# Patient Record
Sex: Female | Born: 1960 | Race: White | Hispanic: No | Marital: Married | Smoking: Never smoker
Health system: Southern US, Community
[De-identification: ages and names within clinical notes are randomized; demographics above are authoritative.]

## PROBLEM LIST (undated history)

## (undated) DIAGNOSIS — F319 Bipolar disorder, unspecified: Secondary | ICD-10-CM

## (undated) DIAGNOSIS — F410 Panic disorder [episodic paroxysmal anxiety] without agoraphobia: Secondary | ICD-10-CM

## (undated) DIAGNOSIS — M1990 Unspecified osteoarthritis, unspecified site: Secondary | ICD-10-CM

## (undated) DIAGNOSIS — F32A Depression, unspecified: Secondary | ICD-10-CM

## (undated) DIAGNOSIS — T7840XA Allergy, unspecified, initial encounter: Secondary | ICD-10-CM

## (undated) DIAGNOSIS — R000 Tachycardia, unspecified: Secondary | ICD-10-CM

## (undated) DIAGNOSIS — Z86010 Personal history of colonic polyps: Secondary | ICD-10-CM

## (undated) DIAGNOSIS — E119 Type 2 diabetes mellitus without complications: Secondary | ICD-10-CM

## (undated) DIAGNOSIS — A159 Respiratory tuberculosis unspecified: Secondary | ICD-10-CM

## (undated) DIAGNOSIS — H269 Unspecified cataract: Secondary | ICD-10-CM

## (undated) DIAGNOSIS — E785 Hyperlipidemia, unspecified: Secondary | ICD-10-CM

## (undated) DIAGNOSIS — I10 Essential (primary) hypertension: Secondary | ICD-10-CM

## (undated) DIAGNOSIS — F329 Major depressive disorder, single episode, unspecified: Secondary | ICD-10-CM

## (undated) HISTORY — DX: Allergy, unspecified, initial encounter: T78.40XA

## (undated) HISTORY — DX: Essential (primary) hypertension: I10

## (undated) HISTORY — DX: Major depressive disorder, single episode, unspecified: F32.9

## (undated) HISTORY — DX: Depression, unspecified: F32.A

## (undated) HISTORY — DX: Hyperlipidemia, unspecified: E78.5

## (undated) HISTORY — PX: COLONOSCOPY: SHX174

## (undated) HISTORY — DX: Personal history of colonic polyps: Z86.010

## (undated) HISTORY — DX: Unspecified cataract: H26.9

## (undated) HISTORY — DX: Type 2 diabetes mellitus without complications: E11.9

## (undated) HISTORY — PX: POLYPECTOMY: SHX149

## (undated) HISTORY — DX: Tachycardia, unspecified: R00.0

## (undated) HISTORY — DX: Panic disorder (episodic paroxysmal anxiety): F41.0

## (undated) HISTORY — DX: Bipolar disorder, unspecified: F31.9

## (undated) HISTORY — DX: Respiratory tuberculosis unspecified: A15.9

## (undated) HISTORY — DX: Unspecified osteoarthritis, unspecified site: M19.90

## (undated) HISTORY — PX: EYE SURGERY: SHX253

---
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
## 2001-03-22 ENCOUNTER — Other Ambulatory Visit: Admission: RE | Admit: 2001-03-22 | Discharge: 2001-03-22 | Payer: Self-pay | Admitting: Family Medicine

## 2001-04-05 ENCOUNTER — Encounter: Payer: Self-pay | Admitting: Family Medicine

## 2001-04-05 ENCOUNTER — Ambulatory Visit (HOSPITAL_COMMUNITY): Admission: RE | Admit: 2001-04-05 | Discharge: 2001-04-05 | Payer: Self-pay | Admitting: Family Medicine

## 2003-10-10 ENCOUNTER — Other Ambulatory Visit: Admission: RE | Admit: 2003-10-10 | Discharge: 2003-10-10 | Payer: Self-pay | Admitting: Family Medicine

## 2003-10-12 ENCOUNTER — Ambulatory Visit (HOSPITAL_COMMUNITY): Admission: RE | Admit: 2003-10-12 | Discharge: 2003-10-12 | Payer: Self-pay | Admitting: Family Medicine

## 2005-04-20 ENCOUNTER — Ambulatory Visit: Payer: Self-pay | Admitting: Family Medicine

## 2005-04-22 ENCOUNTER — Ambulatory Visit: Payer: Self-pay | Admitting: Cardiology

## 2005-07-06 ENCOUNTER — Ambulatory Visit: Payer: Self-pay | Admitting: Family Medicine

## 2005-11-02 ENCOUNTER — Ambulatory Visit: Payer: Self-pay | Admitting: Family Medicine

## 2005-11-06 ENCOUNTER — Other Ambulatory Visit: Admission: RE | Admit: 2005-11-06 | Discharge: 2005-11-06 | Payer: Self-pay | Admitting: Family Medicine

## 2005-11-06 ENCOUNTER — Encounter: Payer: Self-pay | Admitting: Family Medicine

## 2005-11-06 ENCOUNTER — Ambulatory Visit: Payer: Self-pay | Admitting: Family Medicine

## 2005-11-12 ENCOUNTER — Ambulatory Visit (HOSPITAL_COMMUNITY): Admission: RE | Admit: 2005-11-12 | Discharge: 2005-11-12 | Payer: Self-pay | Admitting: Family Medicine

## 2005-12-02 ENCOUNTER — Ambulatory Visit: Payer: Self-pay | Admitting: Family Medicine

## 2006-01-05 ENCOUNTER — Ambulatory Visit: Payer: Self-pay | Admitting: Internal Medicine

## 2006-02-11 ENCOUNTER — Ambulatory Visit (HOSPITAL_COMMUNITY): Payer: Self-pay | Admitting: *Deleted

## 2006-02-22 ENCOUNTER — Ambulatory Visit: Payer: Self-pay | Admitting: Family Medicine

## 2006-02-23 ENCOUNTER — Ambulatory Visit: Payer: Self-pay | Admitting: Family Medicine

## 2006-05-13 ENCOUNTER — Ambulatory Visit: Payer: Self-pay | Admitting: Family Medicine

## 2006-11-04 ENCOUNTER — Encounter: Payer: Self-pay | Admitting: Family Medicine

## 2006-11-04 DIAGNOSIS — K589 Irritable bowel syndrome without diarrhea: Secondary | ICD-10-CM | POA: Insufficient documentation

## 2006-11-04 DIAGNOSIS — R7989 Other specified abnormal findings of blood chemistry: Secondary | ICD-10-CM | POA: Insufficient documentation

## 2006-11-04 DIAGNOSIS — I10 Essential (primary) hypertension: Secondary | ICD-10-CM | POA: Insufficient documentation

## 2006-12-29 ENCOUNTER — Ambulatory Visit: Payer: Self-pay | Admitting: Family Medicine

## 2006-12-29 DIAGNOSIS — M25569 Pain in unspecified knee: Secondary | ICD-10-CM | POA: Insufficient documentation

## 2007-01-11 ENCOUNTER — Telehealth: Payer: Self-pay | Admitting: Family Medicine

## 2007-01-17 ENCOUNTER — Ambulatory Visit: Payer: Self-pay | Admitting: Family Medicine

## 2007-01-17 LAB — CONVERTED CEMR LAB
AST: 22 units/L (ref 0–37)
BUN: 8 mg/dL (ref 6–23)
CO2: 31 meq/L (ref 19–32)
Creatinine, Ser: 0.7 mg/dL (ref 0.4–1.2)
Direct LDL: 137 mg/dL
HDL: 52.3 mg/dL (ref >39.0)
Potassium: 4.3 meq/L (ref 3.5–5.1)
TSH: 3.17 microintl units/mL (ref 0.35–5.50)

## 2007-01-18 ENCOUNTER — Encounter: Payer: Self-pay | Admitting: Family Medicine

## 2007-01-18 ENCOUNTER — Ambulatory Visit: Payer: Self-pay | Admitting: Family Medicine

## 2007-01-18 ENCOUNTER — Other Ambulatory Visit: Admission: RE | Admit: 2007-01-18 | Discharge: 2007-01-18 | Payer: Self-pay | Admitting: Family Medicine

## 2007-01-18 DIAGNOSIS — K921 Melena: Secondary | ICD-10-CM | POA: Insufficient documentation

## 2007-01-21 ENCOUNTER — Encounter (INDEPENDENT_AMBULATORY_CARE_PROVIDER_SITE_OTHER): Payer: Self-pay | Admitting: *Deleted

## 2007-01-26 ENCOUNTER — Ambulatory Visit (HOSPITAL_COMMUNITY): Admission: RE | Admit: 2007-01-26 | Discharge: 2007-01-26 | Payer: Self-pay | Admitting: Family Medicine

## 2007-01-28 ENCOUNTER — Ambulatory Visit: Payer: Self-pay | Admitting: Family Medicine

## 2007-01-31 ENCOUNTER — Encounter (INDEPENDENT_AMBULATORY_CARE_PROVIDER_SITE_OTHER): Payer: Self-pay | Admitting: *Deleted

## 2007-03-22 ENCOUNTER — Ambulatory Visit: Payer: Self-pay | Admitting: Family Medicine

## 2007-05-03 ENCOUNTER — Ambulatory Visit: Payer: Self-pay | Admitting: Family Medicine

## 2007-05-03 DIAGNOSIS — M542 Cervicalgia: Secondary | ICD-10-CM | POA: Insufficient documentation

## 2007-05-05 ENCOUNTER — Encounter: Payer: Self-pay | Admitting: Family Medicine

## 2007-07-26 ENCOUNTER — Ambulatory Visit: Payer: Self-pay | Admitting: Family Medicine

## 2008-01-06 ENCOUNTER — Ambulatory Visit: Payer: Self-pay | Admitting: Family Medicine

## 2008-01-30 ENCOUNTER — Encounter (INDEPENDENT_AMBULATORY_CARE_PROVIDER_SITE_OTHER): Payer: Self-pay | Admitting: *Deleted

## 2008-02-27 ENCOUNTER — Ambulatory Visit: Payer: Self-pay | Admitting: Family Medicine

## 2008-02-27 LAB — CONVERTED CEMR LAB
Albumin: 3.6 g/dL (ref 3.5–5.2)
Alkaline Phosphatase: 42 units/L (ref 39–117)
Basophils Absolute: 0 10*3/uL (ref 0.0–0.1)
Bilirubin, Direct: 0.1 mg/dL (ref 0.0–0.3)
Calcium: 8.9 mg/dL (ref 8.4–10.5)
Cholesterol: 201 mg/dL (ref 0–200)
Direct LDL: 116.6 mg/dL
Eosinophils Absolute: 0.2 10*3/uL (ref 0.0–0.7)
GFR calc Af Amer: 137 mL/min
GFR calc non Af Amer: 113 mL/min
HCT: 39.1 % (ref 36.0–46.0)
MCHC: 33.6 g/dL (ref 30.0–36.0)
MCV: 90.7 fL (ref 78.0–100.0)
Microalb Creat Ratio: 13 mg/g (ref 0.0–30.0)
Microalb, Ur: 1.6 mg/dL (ref 0.0–1.9)
Monocytes Absolute: 0.8 10*3/uL (ref 0.1–1.0)
Platelets: 273 10*3/uL (ref 150–400)
Potassium: 4.5 meq/L (ref 3.5–5.1)
RDW: 13 % (ref 11.5–14.6)
Sodium: 141 meq/L (ref 135–145)
Total CHOL/HDL Ratio: 5.2
Triglycerides: 176 mg/dL — ABNORMAL HIGH (ref 0–149)

## 2008-02-28 LAB — CONVERTED CEMR LAB: Vit D, 1,25-Dihydroxy: 15 — ABNORMAL LOW (ref 30–89)

## 2008-02-29 ENCOUNTER — Ambulatory Visit: Payer: Self-pay | Admitting: Family Medicine

## 2008-02-29 DIAGNOSIS — R05 Cough: Secondary | ICD-10-CM | POA: Insufficient documentation

## 2008-02-29 DIAGNOSIS — K219 Gastro-esophageal reflux disease without esophagitis: Secondary | ICD-10-CM | POA: Insufficient documentation

## 2008-02-29 DIAGNOSIS — R059 Cough, unspecified: Secondary | ICD-10-CM | POA: Insufficient documentation

## 2008-03-12 ENCOUNTER — Ambulatory Visit: Payer: Self-pay | Admitting: Family Medicine

## 2008-03-14 ENCOUNTER — Encounter: Admission: RE | Admit: 2008-03-14 | Discharge: 2008-03-14 | Payer: Self-pay | Admitting: Family Medicine

## 2008-03-14 LAB — CONVERTED CEMR LAB
Calcium: 9.1 mg/dL (ref 8.4–10.5)
Creatinine, Ser: 0.7 mg/dL (ref 0.4–1.2)
GFR calc Af Amer: 114 mL/min
Glucose, Bld: 131 mg/dL — ABNORMAL HIGH (ref 70–99)
Sodium: 142 meq/L (ref 135–145)
Varicella IgG: 3.28 — ABNORMAL HIGH

## 2008-03-15 ENCOUNTER — Other Ambulatory Visit: Admission: RE | Admit: 2008-03-15 | Discharge: 2008-03-15 | Payer: Self-pay | Admitting: Family Medicine

## 2008-03-15 ENCOUNTER — Ambulatory Visit: Payer: Self-pay | Admitting: Family Medicine

## 2008-03-15 ENCOUNTER — Encounter: Payer: Self-pay | Admitting: Family Medicine

## 2008-03-15 DIAGNOSIS — E113299 Type 2 diabetes mellitus with mild nonproliferative diabetic retinopathy without macular edema, unspecified eye: Secondary | ICD-10-CM | POA: Insufficient documentation

## 2008-03-15 DIAGNOSIS — Z794 Long term (current) use of insulin: Secondary | ICD-10-CM

## 2008-03-20 ENCOUNTER — Encounter (INDEPENDENT_AMBULATORY_CARE_PROVIDER_SITE_OTHER): Payer: Self-pay | Admitting: *Deleted

## 2008-03-21 ENCOUNTER — Encounter (INDEPENDENT_AMBULATORY_CARE_PROVIDER_SITE_OTHER): Payer: Self-pay | Admitting: *Deleted

## 2008-03-21 ENCOUNTER — Ambulatory Visit (HOSPITAL_COMMUNITY): Admission: RE | Admit: 2008-03-21 | Discharge: 2008-03-21 | Payer: Self-pay | Admitting: Family Medicine

## 2008-03-21 LAB — HM MAMMOGRAPHY: HM Mammogram: NORMAL

## 2008-04-10 ENCOUNTER — Telehealth: Payer: Self-pay | Admitting: Family Medicine

## 2008-05-02 ENCOUNTER — Ambulatory Visit: Payer: Self-pay | Admitting: Family Medicine

## 2008-05-02 LAB — CONVERTED CEMR LAB
Alkaline Phosphatase: 50 units/L (ref 39–117)
Basophils Absolute: 0 10*3/uL (ref 0.0–0.1)
Bilirubin, Direct: 0.1 mg/dL (ref 0.0–0.3)
Calcium: 9.3 mg/dL (ref 8.4–10.5)
Eosinophils Absolute: 0 10*3/uL (ref 0.0–0.7)
GFR calc Af Amer: 98 mL/min
GFR calc non Af Amer: 81 mL/min
Glucose, Bld: 156 mg/dL — ABNORMAL HIGH (ref 70–99)
HCT: 33.4 % — ABNORMAL LOW (ref 36.0–46.0)
Hemoglobin: 11.4 g/dL — ABNORMAL LOW (ref 12.0–15.0)
MCHC: 34.1 g/dL (ref 30.0–36.0)
Monocytes Absolute: 1.1 10*3/uL — ABNORMAL HIGH (ref 0.1–1.0)
Monocytes Relative: 7.8 % (ref 3.0–12.0)
Neutro Abs: 10.9 10*3/uL — ABNORMAL HIGH (ref 1.4–7.7)
Platelets: 374 10*3/uL (ref 150–400)
Potassium: 4.3 meq/L (ref 3.5–5.1)
RDW: 13.3 % (ref 11.5–14.6)
Sodium: 138 meq/L (ref 135–145)
Total Bilirubin: 1 mg/dL (ref 0.3–1.2)
Total Protein: 7.8 g/dL (ref 6.0–8.3)

## 2008-05-15 ENCOUNTER — Ambulatory Visit: Payer: Self-pay | Admitting: Family Medicine

## 2008-05-17 LAB — CONVERTED CEMR LAB
Mumps IgG: 3.67 — ABNORMAL HIGH
Rubeola IgG: 1.43 — ABNORMAL HIGH

## 2009-06-03 ENCOUNTER — Ambulatory Visit: Payer: Self-pay | Admitting: Family Medicine

## 2009-06-03 LAB — CONVERTED CEMR LAB
ALT: 31 units/L (ref 0–35)
Alkaline Phosphatase: 45 units/L (ref 39–117)
Basophils Relative: 0.6 % (ref 0.0–3.0)
Bilirubin, Direct: 0 mg/dL (ref 0.0–0.3)
CO2: 28 meq/L (ref 19–32)
Chloride: 102 meq/L (ref 96–112)
Creatinine, Ser: 0.6 mg/dL (ref 0.4–1.2)
Direct LDL: 146.6 mg/dL
Eosinophils Relative: 3.3 % (ref 0.0–5.0)
HCT: 40.7 % (ref 36.0–46.0)
HDL: 50.2 mg/dL (ref >39.00)
Hgb A1c MFr Bld: 7.4 % — ABNORMAL HIGH (ref 4.6–6.5)
Microalb Creat Ratio: 203.4 mg/g — ABNORMAL HIGH (ref 0.0–30.0)
Microalb, Ur: 35.8 mg/dL — ABNORMAL HIGH (ref 0.0–1.9)
Monocytes Relative: 7.6 % (ref 3.0–12.0)
Neutrophils Relative %: 51.3 % (ref 43.0–77.0)
Platelets: 284 10*3/uL (ref 150.0–400.0)
Potassium: 4.3 meq/L (ref 3.5–5.1)
RBC: 4.4 M/uL (ref 3.87–5.11)
Sodium: 139 meq/L (ref 135–145)
TSH: 1.42 microintl units/mL (ref 0.35–5.50)
Total Bilirubin: 0.6 mg/dL (ref 0.3–1.2)
Total CHOL/HDL Ratio: 4
Total Protein: 6.7 g/dL (ref 6.0–8.3)
Triglycerides: 222 mg/dL — ABNORMAL HIGH (ref 0.0–149.0)
WBC: 10.2 10*3/uL (ref 4.5–10.5)

## 2009-06-05 ENCOUNTER — Ambulatory Visit: Payer: Self-pay | Admitting: Family Medicine

## 2009-06-05 ENCOUNTER — Other Ambulatory Visit: Admission: RE | Admit: 2009-06-05 | Discharge: 2009-06-05 | Payer: Self-pay | Admitting: Family Medicine

## 2009-06-05 DIAGNOSIS — F319 Bipolar disorder, unspecified: Secondary | ICD-10-CM | POA: Insufficient documentation

## 2009-06-05 LAB — CONVERTED CEMR LAB: Pap Smear: NORMAL

## 2009-06-12 ENCOUNTER — Encounter: Payer: Self-pay | Admitting: Family Medicine

## 2009-06-18 ENCOUNTER — Telehealth: Payer: Self-pay | Admitting: Family Medicine

## 2009-06-21 ENCOUNTER — Telehealth (INDEPENDENT_AMBULATORY_CARE_PROVIDER_SITE_OTHER): Payer: Self-pay | Admitting: Internal Medicine

## 2009-06-26 ENCOUNTER — Ambulatory Visit: Payer: Self-pay | Admitting: Family Medicine

## 2009-06-26 LAB — FECAL OCCULT BLOOD, GUAIAC: Fecal Occult Blood: NEGATIVE

## 2009-07-01 ENCOUNTER — Encounter (INDEPENDENT_AMBULATORY_CARE_PROVIDER_SITE_OTHER): Payer: Self-pay | Admitting: *Deleted

## 2009-07-18 ENCOUNTER — Ambulatory Visit: Payer: Self-pay | Admitting: Family Medicine

## 2009-07-18 LAB — CONVERTED CEMR LAB: AST: 22 units/L (ref 0–37)

## 2009-09-23 ENCOUNTER — Encounter (INDEPENDENT_AMBULATORY_CARE_PROVIDER_SITE_OTHER): Payer: Self-pay | Admitting: *Deleted

## 2009-12-18 ENCOUNTER — Telehealth: Payer: Self-pay | Admitting: Family Medicine

## 2009-12-29 IMAGING — CR DG CHEST 2V
2 series · 2 of 2 positions shown · non-contrast
Comparison: None.

CLINICAL DATA: Cough.

CHEST - 2 VIEW

[w chest pa]
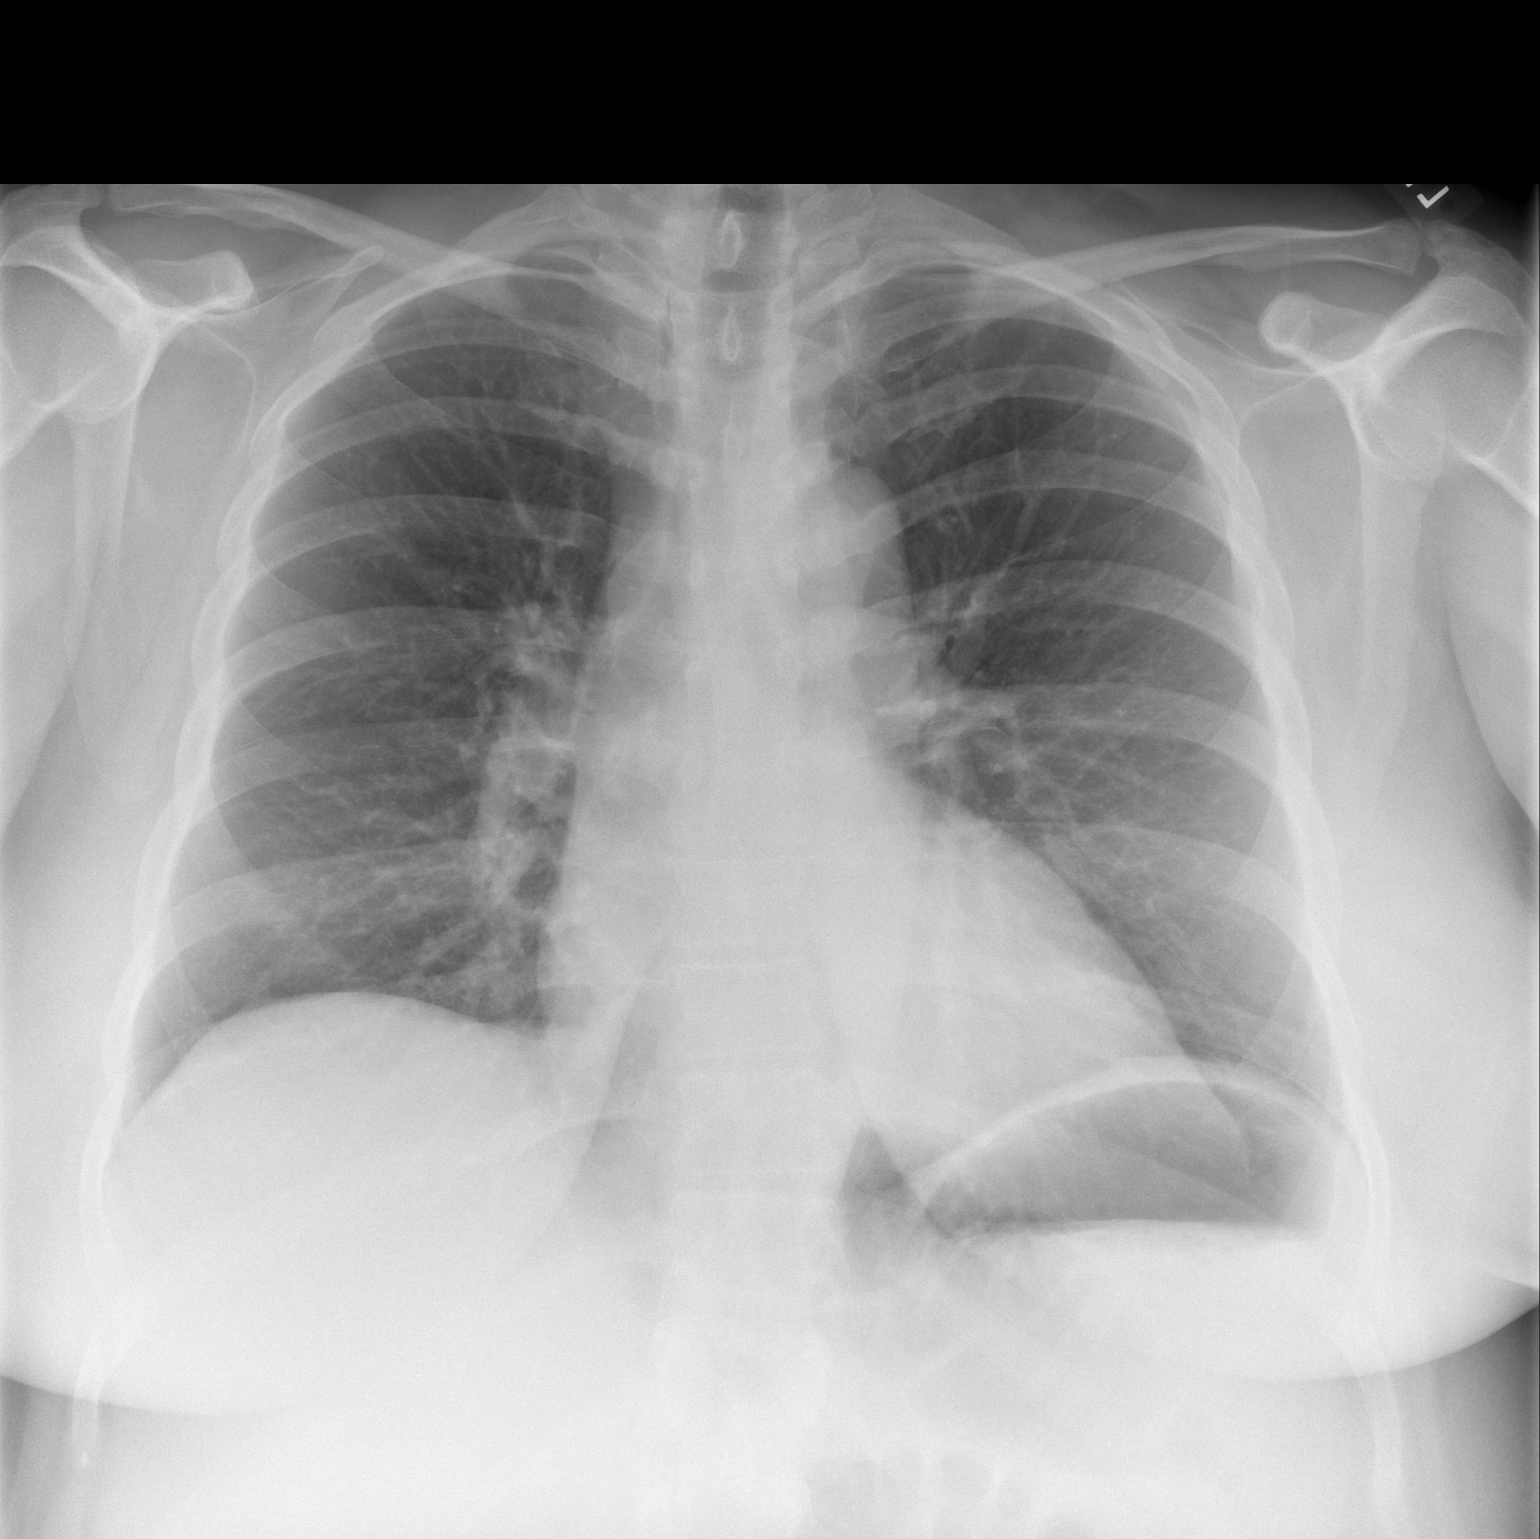

[w chest lat]
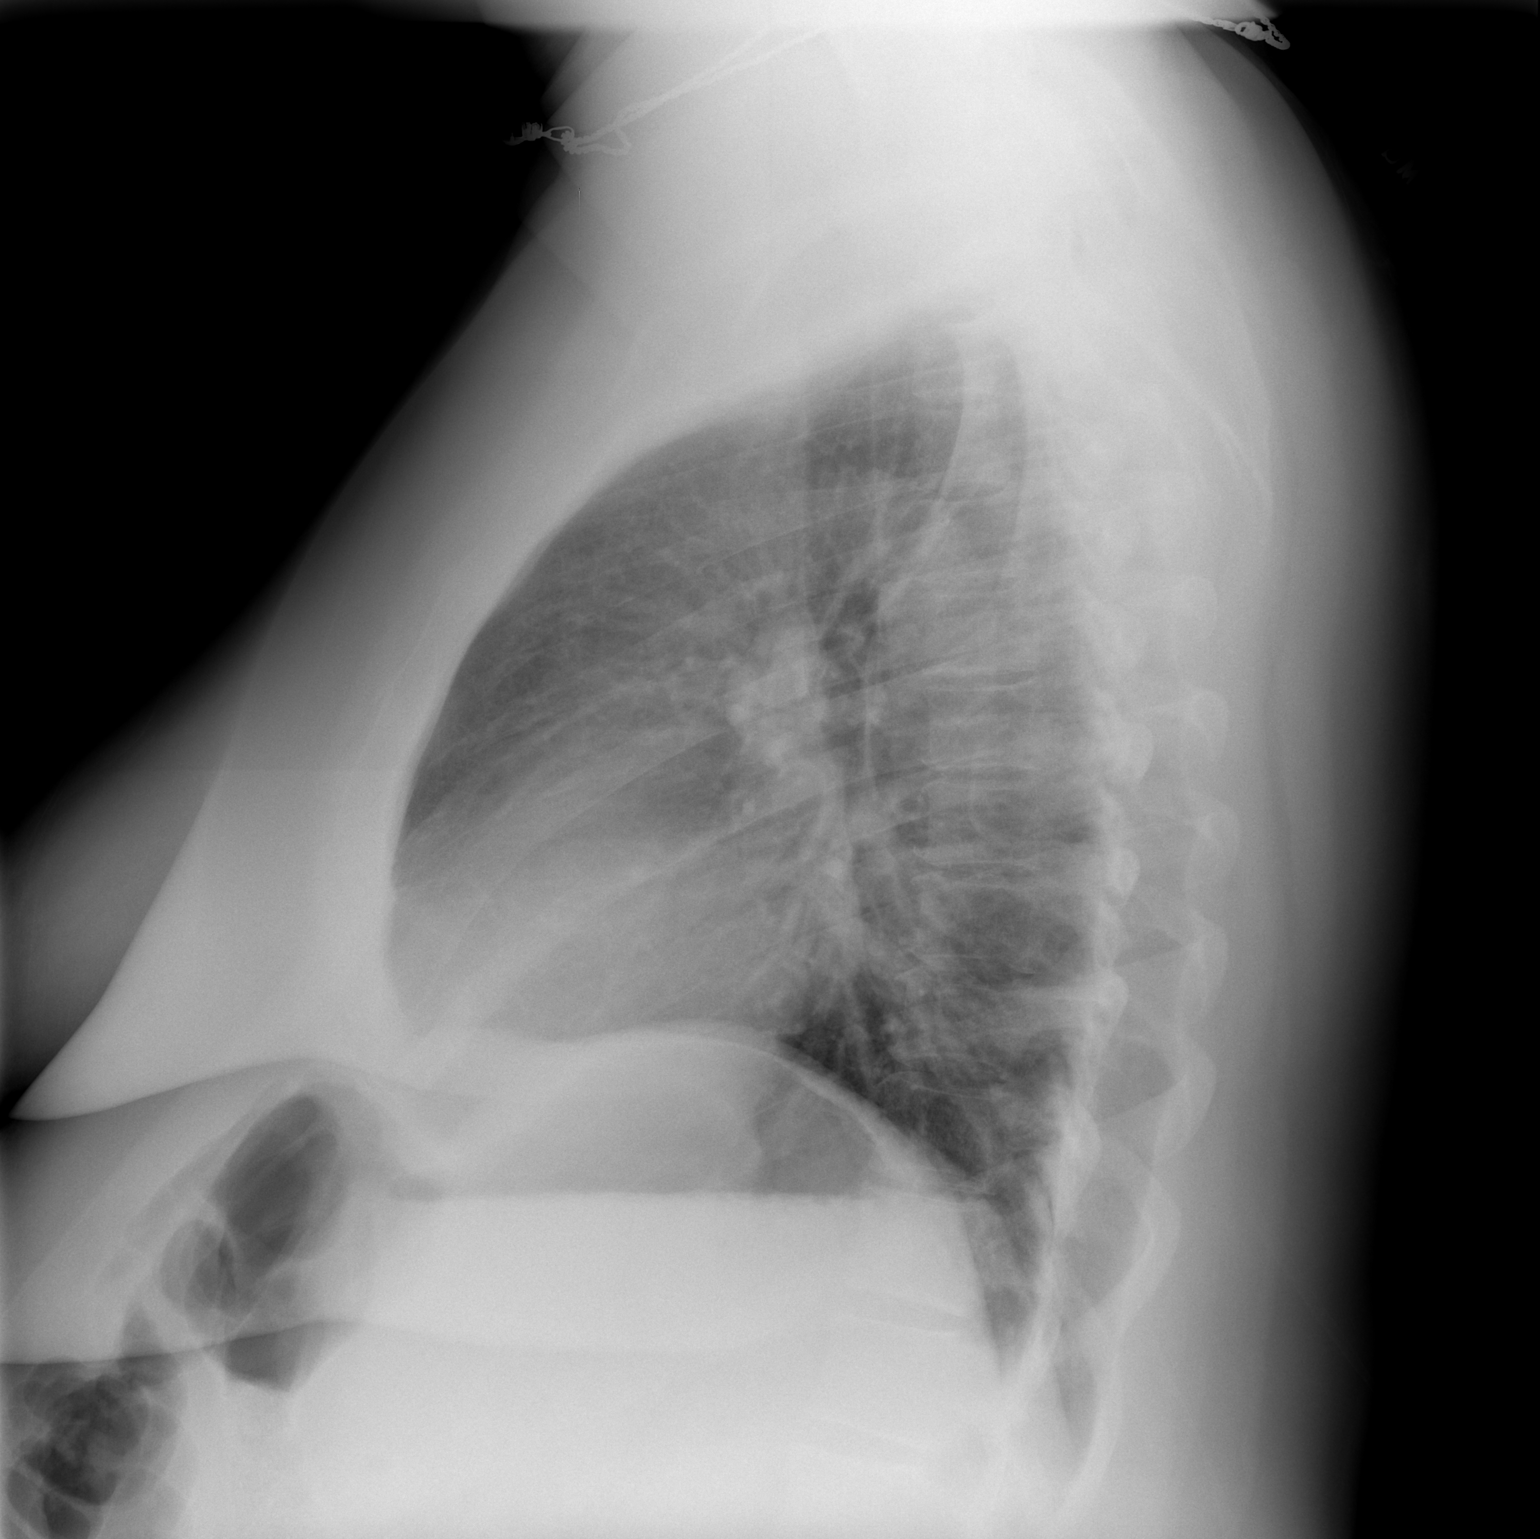

[2 of 2 positions shown; findings below may reference images not displayed]

FINDINGS: Normal sized heart.  Clear lungs.  Mild thoracic spine
degenerative changes and minimal scoliosis.
IMPRESSION: No acute abnormality.

## 2010-01-10 ENCOUNTER — Ambulatory Visit: Payer: Self-pay | Admitting: Family Medicine

## 2010-01-13 LAB — CONVERTED CEMR LAB
ALT: 37 units/L — ABNORMAL HIGH (ref 0–35)
BUN: 11 mg/dL (ref 6–23)
Calcium: 9 mg/dL (ref 8.4–10.5)
Cholesterol: 168 mg/dL (ref 0–200)
Creatinine, Ser: 0.5 mg/dL (ref 0.4–1.2)
Creatinine,U: 192.6 mg/dL
Direct LDL: 102.5 mg/dL
GFR calc non Af Amer: 129.1 mL/min (ref >60)
HDL: 48.6 mg/dL (ref >39.00)
Hgb A1c MFr Bld: 7.5 % — ABNORMAL HIGH (ref 4.6–6.5)
Microalb Creat Ratio: 27.5 mg/g (ref 0.0–30.0)
Microalb, Ur: 52.9 mg/dL — ABNORMAL HIGH (ref 0.0–1.9)
Total Bilirubin: 0.5 mg/dL (ref 0.3–1.2)
Triglycerides: 204 mg/dL — ABNORMAL HIGH (ref 0.0–149.0)
VLDL: 40.8 mg/dL — ABNORMAL HIGH (ref 0.0–40.0)

## 2010-01-15 ENCOUNTER — Encounter (INDEPENDENT_AMBULATORY_CARE_PROVIDER_SITE_OTHER): Payer: Self-pay | Admitting: *Deleted

## 2010-01-17 ENCOUNTER — Ambulatory Visit: Payer: Self-pay | Admitting: Family Medicine

## 2010-02-25 ENCOUNTER — Ambulatory Visit: Payer: Self-pay | Admitting: Family Medicine

## 2010-02-25 DIAGNOSIS — K5289 Other specified noninfective gastroenteritis and colitis: Secondary | ICD-10-CM | POA: Insufficient documentation

## 2010-02-25 DIAGNOSIS — L723 Sebaceous cyst: Secondary | ICD-10-CM | POA: Insufficient documentation

## 2010-04-10 ENCOUNTER — Ambulatory Visit: Payer: Self-pay | Admitting: Family Medicine

## 2010-04-15 ENCOUNTER — Ambulatory Visit: Payer: Self-pay | Admitting: Family Medicine

## 2010-04-30 ENCOUNTER — Encounter: Payer: Self-pay | Admitting: Family Medicine

## 2010-05-14 ENCOUNTER — Telehealth: Payer: Self-pay | Admitting: Family Medicine

## 2010-05-22 ENCOUNTER — Encounter
Admission: RE | Admit: 2010-05-22 | Discharge: 2010-07-15 | Payer: Self-pay | Source: Home / Self Care | Attending: Family Medicine | Admitting: Family Medicine

## 2010-05-22 ENCOUNTER — Encounter: Payer: Self-pay | Admitting: Family Medicine

## 2010-06-20 ENCOUNTER — Ambulatory Visit
Admission: RE | Admit: 2010-06-20 | Discharge: 2010-06-20 | Payer: Self-pay | Source: Home / Self Care | Attending: Family Medicine | Admitting: Family Medicine

## 2010-06-20 ENCOUNTER — Encounter: Payer: Self-pay | Admitting: Family Medicine

## 2010-07-07 ENCOUNTER — Encounter: Payer: Self-pay | Admitting: Family Medicine

## 2010-07-15 NOTE — Assessment & Plan Note (Signed)
Summary: F/U/DR SCHALLER'S PT/CLE   Vital Signs:  Patient profile:   50 year old female Height:      68 inches Weight:      313.25 pounds BMI:     47.80 Temp:     98.3 degrees F oral Pulse rate:   84 / minute Pulse rhythm:   regular BP sitting:   126 / 96  (left arm) Cuff size:   large  Vitals Entered By: Delilah Shan CMA Duncan Dull) (January 17, 2010 4:23 PM) CC: follow-up visit.  Patient has only been taking Glucophage and Metoprolol once per day.  Our meds list says bid    History of Present Illness: Diabetes:  Using medications without difficulties:yes but had been taking metformin qday instead of two times a day  Hypoglycemic episodes:no Hyperglycemic episodes:no Feet problems:no Blood Sugars averaging: highest 350 after eating, lowest 80, but usually  ~120-130 in AM eye exam within last year:yes  Elevated Cholesterol: Using medications without problems:yes Muscle aches: occ/infrequent muscle cramps at night in R calf Other complaints:no  Hypertension:      Using medication without problems or lightheadedness: yes Chest pain with exertion:no Edema: occ but no change from baseline Short of breath: occ but likely habitus related (deconditioning) Average home BPs:no  Labs reviewed with patient.  Allergies: No Known Drug Allergies  Past History:  Family History: Last updated: 06/05/2009 Father: dec  58  diabetes with coronary disease, bypass graft, died with staphylococcal infection Mother: decd 78 Metastatic Liver Ca  hypothyroidism and an eye tumor, which they removed her orbit at the end of 2004 (Gladys Wrenn) Siblings Sister A 46  hypothyroidism  Social History: Last updated: 01/17/2010 Marital Status: Married Children: One disabeled child, has a rare chromosome deletion disorder (decresed Mental ability)     Mother lived in home (Deceased quickly  spr 2022-11-15 from metastatic melanoma of the eye everwhere but signif to the liver) Occupation: [employer] 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  Otherwise negative.    Physical Exam  General:  GEN: nad, alert and oriented, obese HEENT: mucous membranes moist NECK: supple w/o LA CV: rrr.  no murmur PULM: ctab, no inc wob ABD: soft, +bs EXT: no edema SKIN: no acute rash   Diabetes Management Exam:    Foot Exam (with socks and/or shoes not present):       Sensory-Pinprick/Light touch:          Left medial foot (L-4): normal          Left dorsal foot (L-5): normal          Left lateral foot (S-1): normal          Right medial foot (L-4): normal          Right dorsal foot (L-5): normal          Right lateral foot (S-1): normal       Sensory-Monofilament:  Left foot: normal          Right foot: normal       Inspection:          Left foot: normal          Right foot: normal       Nails:          Left foot: normal          Right foot: normal   Impression & Recommendations:  Problem # 1:  HYPERTENSION (ICD-401.9) D;w patient.  Add ACE.  she is still having menses.  Advised re: teratogenic effects.  not sexually active.  Change to two times a day betablocker Her updated medication list for this problem includes:    Metoprolol Tartrate 25 Mg Tabs (Metoprolol tartrate) .Marland Kitchen... Take 1 two times a day for bp and heart rate    Prinivil 10 Mg Tabs (Lisinopril) .Marland Kitchen... 1 by mouth qday  Problem # 2:  AODM  (ICD-250.00) Add ACE and use metformin two times a day.  recheck 3 months.  Her updated medication list for this problem includes:    Glucophage 500 Mg Tabs (Metformin hcl) ..... One tab by mouth two times a day    Prinivil 10 Mg Tabs (Lisinopril) .Marland Kitchen... 1 by mouth qday  Problem # 3:  HYPERCHOLESTEROLEMIA, 226/ LDL 146 (ICD-272.0) No change in meds.  d/w patient KG:MWNUUV and gradual changes with diet . Her updated medication list for this problem includes:    Zocor 40 Mg Tabs (Simvastatin) ..... One tab at night  Complete Medication List: 1)  Lamictal 25 Mg Tbdp (Lamotrigine) .Marland Kitchen.. 10 tabs by mouth once daily.  currently at 150 mg. once daily. 2)  Xanax 0.5 Mg Tabs (Alprazolam) .... Take one by mouth three times a day as needed 3)  Pristiq 50 Mg Xr24h-tab (Desvenlafaxine succinate) .Marland Kitchen.. 1 daily by mouth 4)  Multivitamins Tabs (Multiple vitamin) .... Take 1 tablet by mouth once a day 5)  Glucophage 500 Mg Tabs (Metformin hcl) .... One tab by mouth two times a day 6)  Zocor 40 Mg Tabs (Simvastatin) .... One tab at night 7)  Metoprolol Tartrate 25 Mg Tabs (Metoprolol tartrate) .... Take 1 two times a day for bp and heart rate 8)  Accucheck Aviva Glucometer  .... Use to check blood sugars 1-2 times daily 9)  Accucheck Aviva Test Strips  .... Check blood sugar 1-2 times daily as directed 10)  Prinivil 10 Mg Tabs (Lisinopril) .Marland Kitchen.. 1 by mouth qday  Patient Instructions: 1)  Work on [employer] for your job.  2)  Come back for nonfasting labs in 3 months and then an OV a few days later.  3)  A1c, 250.00 4)  Let me know if you have any trouble with the new blood pressure medicine.  5)  Take metformin and metoprolol twice a day.  Prescriptions: METOPROLOL TARTRATE 25 MG TABS (METOPROLOL TARTRATE) take 1 two times a day for BP and heart rate  #180 x 3   Entered and Authorized by:   Crawford Givens MD   Signed by:   Crawford Givens MD on 01/17/2010   Method used:   Electronically to         [facility] [address] (retail)       [address], [address]       [place], [place]  [address]       Ph: [phone]  Fax: [phone]   RxID:   [id number] ZOCOR 40 MG TABS (SIMVASTATIN) one tab at night  #90 x 3   Entered and Authorized by:   Crawford Givens MD   Signed by:   Crawford Givens MD on 01/17/2010   Method used:   Electronically to        [facility] [address] (retail)       [address], [address]       [place], [place]  [address]       Ph: [phone]       Fax: [phone]   RxID:   [id number] GLUCOPHAGE 500 MG TABS (METFORMIN HCL) one tab by mouth two times a day  #180 x 3   Entered and Authorized by:   Crawford Givens MD   Signed by:   Crawford Givens MD on 01/17/2010   Method used:   Electronically to        [facility] [address] (retail)       [address], [address]       [place], [place]  [address]       Ph: [phone]       Fax: [phone]   RxID:   [id number] PRINIVIL 10 MG TABS (LISINOPRIL) 1 by mouth qday  #90 x 3   Entered and Authorized by:   Crawford Givens MD   Signed by:   Crawford Givens MD on 01/17/2010   Method used:   Electronically to        [facility] [address] (retail)       [address], [address]       [place], [place]  [address]       Ph: [phone]       Fax: [phone]   RxID:   [id number]   Current Allergies (reviewed today): No known allergies

## 2010-07-15 NOTE — Assessment & Plan Note (Signed)
Summary: 3 M F/U DLO   Vital Signs:  Patient profile:   50 year old female Height:      68 inches Weight:      304.75 pounds BMI:     46.50 Temp:     98.1 degrees F oral Pulse rate:   80 / minute Pulse rhythm:   regular BP sitting:   146 / 96  (left arm) Cuff size:   large  Vitals Entered By: Delilah Shan CMA Duncan Dull) (April 15, 2010 8:18 AM) CC: 3 months follow up.  Patient says she doesn't remember being instructed to start Prinivil   History of Present Illness: Diabetes:  Using medications without difficulties:yes Hypoglycemic episodes:no Hyperglycemic episodes: highest AM sugar is 180 Feet problems: no Blood Sugars averaging: as above eye exam within last year: yes packing lunch for work and this is an improvement.   A1c increased and not yet on ACE.   Interested in DM2 education.    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  Otherwise negative.    Physical Exam  General:  GEN: nad, alert and oriented HEENT: mucous membranes moist NECK: supple w/o LA CV: rrr PULM: ctab, no inc wob ABD: soft, +bs, obese EXT: no edema   Diabetes Management Exam:    Foot Exam (with socks and/or shoes not present):       Sensory-Pinprick/Light touch:          Left medial foot (L-4): normal          Left dorsal foot (L-5): normal          Left lateral foot (S-1): normal          Right medial foot (L-4): normal          Right dorsal foot (L-5): normal      Right lateral foot (S-1): normal       Sensory-Monofilament:          Left foot: normal          Right foot: normal       Inspection:          Left foot: normal          Right foot: normal       Nails:          Left foot: normal          Right foot: normal   Impression & Recommendations:  Problem # 1:  AODM (ICD-250.00) Pt is aware of pregnancy precaution with ACE.  Start ACE and send for DM2 education.  If A1c is increasing, she'll likely need insulin and I talked with her about this today.  Increase metformin.  Her updated medication list for this problem includes:    Glucophage 500 Mg Tabs (Metformin hcl) .Marland Kitchen... 2 tabs by mouth in am and 1 tab in pm, increase to 2 two times a day if tolerated    Prinivil 10 Mg Tabs (Lisinopril) .Marland Kitchen... 1 by mouth qday  Orders: Misc. Referral (  Misc. Ref)  Complete Medication List: 1)  Lamictal 25 Mg Tbdp (Lamotrigine) .Marland Kitchen.. 10 tabs by mouth once daily.  currently at 150 mg. once daily. 2)  Xanax 0.5 Mg Tabs (Alprazolam) .... Take one by mouth three times a day as needed 3)  Pristiq 50 Mg Xr24h-tab (Desvenlafaxine succinate) .Marland Kitchen.. 1 daily by mouth 4)  Multivitamins Tabs (Multiple vitamin) .... Take 1 tablet by mouth once a day 5)  Glucophage 500 Mg Tabs (Metformin hcl) .... 2 tabs by mouth in am and 1 tab in pm, increase to 2 two times a day if tolerated 6)  Zocor 40 Mg Tabs (Simvastatin) .... One tab at night 7)  Metoprolol Tartrate 25 Mg Tabs (Metoprolol tartrate) .... Take 1 two times a day for bp and heart rate 8)  Accucheck Aviva Glucometer  .... Use to check blood sugars 1-2 times daily 9)  Accucheck Aviva Test Strips  .... Check blood sugar 1-2 times daily as directed 10)  Prinivil 10 Mg Tabs (Lisinopril) .Marland Kitchen.. 1 by mouth qday  Patient Instructions: 1)  I want you to increase your metformin; take 2 in AM and 1 in PM.  If tolerated, increase to 2 two times a day.  2)  Start the lisinopril and take it once a day. 3)  Work on [employer] and  weight. 4)  See Shirlee Limerick about your referral before your leave today.   5)  I want you to come back in 3 months for a 30 min visit.  Schedule a lab visit before for an A1c- 250.00.    Orders Added: 1)  Est. Patient Level III [03474] 2)  Misc. Referral [Misc. Ref]    Current Allergies (reviewed today): No known allergies

## 2010-07-15 NOTE — Letter (Signed)
Summary: Results Follow up Letter  [place] at [facility]  [address] [place], [place] [address]   Phone: [phone]  Fax: [phone]    07/01/2009 MRN: [id number]  [facility] [address] [place], [place]  [address]  Dear Ms. Salada,  The following are the results of your recent test(s):  Test         Result    Pap Smear:        Normal _____  Not Normal _____ Comments: ______________________________________________________ Cholesterol: LDL(Bad cholesterol):         Your goal is less than:         HDL (Good cholesterol):       Your goal is more than: Comments:  ______________________________________________________ Mammogram:        Normal _____  Not Normal _____ Comments:  ___________________________________________________________________ Hemoccult:        Normal _X____  Not normal _______ Comments: Please repeat in one year.  _____________________________________________________________________ Other Tests:    We routinely do not discuss normal results over the telephone.  If you desire a copy of the results, or you have any questions about this information we can discuss them at your next office visit.   Sincerely,     Laurita Quint, MD

## 2010-07-15 NOTE — Progress Notes (Signed)
Summary:  METOPROLOL  Phone Note [facility]ill Request [facility]  [phone] on June 18, 2009 1:49 PM  Refills Requested: Medication #1:  METOPROLOL SUCCINATE 25 MG TB24 Take 1 tab  by mouth once day fax request for Metoprolol Tartrate 25mg , which should pt be taking? on 05/27/2008 a e-scribe request came in for METOPROLOL TARTR 25MG  and Gail filled it, but they must have gave her Succinate, because the refill date is on that rx?    Method Requested: Electronic Initial call taken by: Mervin Hack CMA Duncan Dull),  June 18, 2009 1:52 PM  Follow-up for Phone Call        I prefer the 24 hr version which I guess is Succinate.  Thank you. Follow-up by: Shaune Leeks MD,  June 18, 2009 1:57 PM  Additional Follow-up for Phone Call Additional follow up Details #1::        Spoke to [facility] (pharmacist) and was informed that patient has been getting the Metoprolol Tart 25 mg, one two times a day. Pharmacist wants to know if you want her switched to the Succinate (which is the 24 hr) 50 mg once a day or 25 mg once a day?  Please advise.   Form from pharmacy is in your in box and does not match the med sheet.  Additional Follow-up by: Sydell Axon LPN,  June 18, 2009 3:10 PM    New/Updated Medications: METOPROLOL SUCCINATE 50 MG XR24H-TAB (METOPROLOL SUCCINATE) one tab by mouth once daily. Prescriptions: METOPROLOL SUCCINATE 50 MG XR24H-TAB (METOPROLOL SUCCINATE) one tab by mouth once daily.  #30 x 12   Entered and Authorized by:   Shaune Leeks MD   Signed by:   Shaune Leeks MD on 06/18/2009   Method used:   [facility]        [facility]       [address]       [place], [place]  [address]       Ph: [phone] or [phone]       Fax: [phone]   RxID:   [id number]

## 2010-07-15 NOTE — Progress Notes (Signed)
Summary: Med Change / Test Strips  Phone Note Call from Patient Call back at Home Phone [phone]   Caller: Patient Call For: Shaune Leeks MD/ Summary of Call: 1. Patient states that when she went to pick up her Metoprolol, her copay had doubled and it was because it was the extended release.  She has been taking the regular Metoprolol and taking it two times a day.  Unless there is a reason for the change, she would prefer to stick with the two times a day dosing.  She says that there was not change discussed at her OV.         2. Also, at her last OV, Dr. Hetty Ely wants her to start checking her BS and told her to check with her insurance as to which machine they would provide the best coverage for.  That is the Accuchek Aviva.  So she needs an Rx. for the test strips sent to the pharmacy. [facility] [place] Initial call taken by: Delilah Shan CMA Duncan Dull),  June 21, 2009 10:45 AM  Follow-up for Phone Call        [facility] Rxs [facility] [place]   Billie-Lynn Tyler Deis FNP  June 21, 2009 3:09 PM   Additional Follow-up for Phone Call Additional follow up Details #1::        Advised pt. Additional Follow-up by: Lowella Petties CMA,  June 21, 2009 4:48 PM    New/Updated Medications: METOPROLOL TARTRATE 25 MG TABS (METOPROLOL TARTRATE) take 1 two times a day for BP and heart rate * ACCUCHECK AVIVA GLUCOMETER use to check blood sugars 1-2 times daily * ACCUCHECK AVIVA TEST STRIPS check blood sugar 1-2 times daily as directed Prescriptions: ACCUCHECK AVIVA TEST STRIPS check blood sugar 1-2 times daily as directed  #1 bottle x 12   Entered and Authorized by:   Gildardo Griffes FNP   Signed by:   Gildardo Griffes FNP on 06/21/2009   Method used:   Print then Give to Patient   RxID:   [id number] ACCUCHECK AVIVA GLUCOMETER use to check blood sugars 1-2 times daily  #1 x 0   Entered and Authorized by:   Gildardo Griffes FNP   Signed by:    Gildardo Griffes FNP on 06/21/2009   Method used:   Print then Give to Patient   RxID:   [id number] METOPROLOL TARTRATE 25 MG TABS (METOPROLOL TARTRATE) take 1 two times a day for BP and heart rate  #60 x 6   Entered and Authorized by:   Gildardo Griffes FNP   Signed by:   Gildardo Griffes FNP on 06/21/2009   Method used:   Print then Give to Patient   RxID:   [id number]   Appended Document: Med Change / Test Strips [facility].

## 2010-07-15 NOTE — Letter (Signed)
Summary: [place] No Show Letter  [place] at [facility]  [address] [place], [place] [address]   Phone: [phone]  Fax: [phone]    09/23/2009 MRN: [id number]  [facility] [address] [place], [place]  [address]   Dear Ms. Kittleson,   Our records indicate that you missed your scheduled appointment with ___lab__________________ on ___4.11.11_________.  Please contact this office to reschedule your appointment as soon as possible.  It is important that you keep your scheduled appointments with your physician, so we can provide you the best care possible.  Please be advised that there may be a charge for "no show" appointments.    Sincerely,   [place] at [facility]

## 2010-07-15 NOTE — Progress Notes (Signed)
  Phone Note Outgoing Call   Summary of Call: Please call patient and schedule her for OV re: DM/lipids.  Will need labs before visit.  GSD.  Okay, what labs do you want for DM and I'll need codes to include with the order.  Lugene Fuquay CMA Elizabethann Lackey Dull)  December 19, 2009 10:27 AM   Follow-up for Phone Call        cmet, lipid, A1c, microalbumin.  250.00 Follow-up by: Crawford Givens MD,  December 19, 2009 9:03 PM  Additional Follow-up for Phone Call Additional follow up Details #1::        Left message on voicemail  to return call. Lugene Fuquay CMA Raquelle Pietro Dull)  December 20, 2009 8:28 AM  Left message on voicemail  to return call. Lugene Fuquay CMA Anastasia Tompson Dull)  December 25, 2009 11:53 AM    Patient returned call saying she was out of town last week and in training sessions at work today.  Left message on voicemail  in detail.  Personalized VM.  Additional Follow-up by: Delilah Shan CMA (AAMA),  December 26, 2009 2:18 PM

## 2010-07-15 NOTE — Letter (Signed)
Summary: Nadara Eaton letter  [place] at [facility]  [address] [place], [place] [address]   Phone: [phone]  Fax: [phone]       01/15/2010 MRN: [id number]  [facility] [address] [place], [place]  [address]  Dear Ms. Berni,  [facility], and [place] announce the retirement of Arta Silence, M.D., from full-time practice at the [facility] office effective December 12, 2009 and his plans of returning part-time.  It is important to Dr. Hetty Ely and to our practice that you understand that [facility] has seven physicians in our office for your health care needs.  We will continue to offer the same exceptional care that you have today.    Dr. Hetty Ely has spoken to many of you about his plans for retirement and returning part-time in the fall.   We will continue to work with you through the transition to schedule appointments for you in the office and meet the high standards that [facility] is committed to.   Again, it is with great pleasure that we share the news that Dr. Hetty Ely will return to [facility] in October of 2011 with a reduced schedule.    If you have any questions, or would like to request an appointment with one of our physicians, please call us at [phone] and press the option for Scheduling an appointment.  We take pleasure in providing you with excellent patient care and look forward to seeing you at your next office visit.  Our [facility] Physicians are:  Tillman Abide, M.D. Laurita Quint, M.D. Roxy Manns, M.D. Kerby Nora, M.D. Hannah Beat, M.D. Ruthe Mannan, M.D. We proudly welcomed Raechel Ache, M.D. and Eustaquio Boyden, M.D. to the practice in July/August 2011.  Sincerely,  [facility]

## 2010-07-15 NOTE — Assessment & Plan Note (Signed)
Summary: STOMACH FLU/DLO   Vital Signs:  Patient profile:   50 year old female Height:      68 inches Weight:      303.75 pounds BMI:     46.35 Temp:     98.6 degrees F oral Pulse rate:   84 / minute Pulse rhythm:   regular BP sitting:   122 / 90  (left arm) Cuff size:   large  Vitals Entered By: Delilah Shan CMA  Dull) (February 25, 2010 11:36 AM) CC: Stomach Flu   History of Present Illness: Started over the weekend with diarrhea and vomiting.  Still with diarrhea.  No sick contacts.  "If I get stressed out, I tend to get a stomach bug."  99.5 this AM.  Vomitus wasn't bloody; it looked more like mucous.  Nonbloody diarrhea.  Out of work for 2 days.  + Myalgias.  No increase in cough.  No sputum.    Allergies: No Known Drug Allergies  Review of Systems       See HPI.  Otherwise negative.    Physical Exam  General:  GEN: nad, alert and oriented HEENT: mucous membranes moist NECK: supple w/o LA CV: rrr PULM: ctab, no inc wob ABD: soft, +bs, rectus tender on testing but o/w abdominal exam wnl, obese EXT: no edema SKIN: no acute rash but small area with minimal erythema on back.  this appears to be a minimally irriated seb cyst   Impression & Recommendations:  Problem # 1:  GASTROENTERITIS (ICD-558.9) resolving.  excuse from work, potentially contagious.  Return when no NAV, fever.  zofran and fluids in meantime.  follow up as needed.  Not dehydrated.  Her updated medication list for this problem includes:    Ondansetron Hcl 4 Mg Tabs (Ondansetron hcl) .Marland Kitchen... 1 by mouth three times a day as needed nausea or vomiting  Problem # 2:  SEBACEOUS CYST (ICD-706.2)  Complete Medication List: 1)  Lamictal 25 Mg Tbdp (Lamotrigine) .Marland Kitchen.. 10 tabs by mouth once daily.  currently at 150 mg. once daily. 2)  Xanax 0.5 Mg Tabs (Alprazolam) .... Take one by mouth three times a day as needed 3)  Pristiq 50 Mg Xr24h-tab (Desvenlafaxine succinate) .Marland Kitchen.. 1 daily by mouth 4)  Multivitamins  Tabs (Multiple vitamin) .... Take 1 tablet by mouth once a day 5)  Glucophage 500 Mg Tabs (Metformin hcl) .... One tab by mouth two times a day 6)  Zocor 40 Mg Tabs (Simvastatin) .... One tab at night 7)  Metoprolol Tartrate 25 Mg Tabs (Metoprolol tartrate) .... Take 1 two times a day for bp and heart rate 8)  Accucheck Aviva Glucometer  .... Use to check blood sugars 1-2 times daily 9)  Accucheck Aviva Test Strips  .... Check blood sugar 1-2 times daily as directed 10)  Prinivil 10 Mg Tabs (Lisinopril) .Marland Kitchen.. 1 by mouth qday 11)  Ondansetron Hcl 4 Mg Tabs (Ondansetron hcl) .Marland Kitchen.. 1 by mouth three times a day as needed nausea or vomiting  Patient Instructions: 1)  Drink plenty of fluids and take the zofran as needed for nausea. 2)  Okay to go back to work tomorrow if no fever, vomting or nausea.  Potentially contagious- please excuse recent missed work.  3)  Let me know if the spot on your back gets more painful or larger.  4)  Take care.  Prescriptions: ONDANSETRON HCL 4 MG TABS (ONDANSETRON HCL) 1 by mouth three times a day as needed nausea or vomiting  #20 x  1   Entered and Authorized by:   Crawford Givens MD   Signed by:   Crawford Givens MD on 02/25/2010   Method used:   [facility]        [facility] (retail)       [address]       [place], [place]  [address]       Ph: [phone] or [phone]       Fax: [phone]   RxID:   [id number]   Current Allergies (reviewed today): No known allergies

## 2010-07-15 NOTE — Letter (Signed)
Summary: Patient cancelled appt./ [facility]   Patient cancelled appt./ [facility]   Imported By: Maryln Gottron 05/09/2010 14:01:40  _____________________________________________________________________  External Attachment:    Type:   Image     Comment:   External Document

## 2010-07-15 NOTE — Progress Notes (Signed)
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

## 2010-07-17 NOTE — Letter (Signed)
Summary: Out of Work  [facility] [employer] [facility]  [address] [place], [place] [address]   Phone: [phone]  Fax: [phone]    June 20, 2010   Employee:  ARMONII SIEH    To Whom It May Concern:   For Medical reasons, please excuse the above named employee from work for the following dates:  Start:   from today  End:   until cough and congestion resolved, potentially contagious.  If you need additional information, please feel free to contact our office.         Sincerely,    Crawford Givens MD

## 2010-07-17 NOTE — Letter (Signed)
Summary: [facility]  [facility]   Imported By: Lanelle Bal 05/29/2010 15:54:28  _____________________________________________________________________  External Attachment:    Type:   Image     Comment:   External Document

## 2010-07-17 NOTE — Assessment & Plan Note (Signed)
Summary: SORE THROAT, SINUS SYMPTOMS   Vital Signs:  Patient profile:   50 year old female Height:      68 inches Weight:      197.25 pounds BMI:     30.10 Temp:     97.8 degrees F oral Pulse rate:   80 / minute Pulse rhythm:   regular BP sitting:   132 / 90  (left arm) Cuff size:   large  Vitals Entered By: Delilah Shan CMA Tamatha Gadbois Dull) (June 20, 2010 9:40 AM) CC: ST, sinus symptoms   History of Present Illness: DM2- AM 130-180 sugars.  We clarified the metformin dose and she will increase as tolerated, up to 4 tabs a day.   Recent sinus symptoms.  Started with ST Wednesday, also with mild HA.  Since then, now with increase in ST and pain across the face.  Exposure to strep.  Inconsistent subjective fevers.  + cough, occ sputum.  post nasal gtt is bothering patient.    Allergies: No Known Drug Allergies  Review of Systems       See HPI.  Otherwise negative.    Physical Exam  General:  GEN: nad, alert and oriented, obese HEENT: mucous membranes moist, TM w/o erythema, nasal epithelium injected, OP with cobblestoning NECK: supple w/o LA CV: rrr. PULM: ctab, no inc wob ABD: soft, +bs EXT: no edema    Impression & Recommendations:  Problem # 1:  PHARYNGITIS-ACUTE (ICD-462)  likely viral.  RST neg and she is nontoxic.  Supportive tx and we clarified DM2 med doses.  She agrees.   Orders: Prescription Created Electronically [phone])  Complete Medication List: 1)  Lamictal 25 Mg Tbdp (Lamotrigine) .Marland Kitchen.. 10 tabs by mouth once daily.  currently at 150 mg. once daily. 2)  Xanax 0.5 Mg Tabs (Alprazolam) .... Take one by mouth three times a day as needed 3)  Pristiq 50 Mg Xr24h-tab (Desvenlafaxine succinate) .Marland Kitchen.. 1 daily by mouth 4)  Multivitamins Tabs (Multiple vitamin) .... Take 1 tablet by mouth once a day 5)  Glucophage 500 Mg Tabs (Metformin hcl) .... 2 tabs by mouth in am and 1 tab in pm, increase to 2 two times a day if tolerated 6)  Zocor 40 Mg Tabs (Simvastatin) ....  One tab at night 7)  Metoprolol Tartrate 25 Mg Tabs (Metoprolol tartrate) .... Take 1 two times a day for bp and heart rate 8)  Accucheck Aviva Glucometer  .... Use to check blood sugars 1-2 times daily 9)  Accucheck Aviva Test Strips  .... Check blood sugar 1-2 times daily as directed 10)  Prinivil 10 Mg Tabs (Lisinopril) .Marland Kitchen.. 1 by mouth qday 11)  Tessalon 200 Mg Caps (Benzonatate) .Marland Kitchen.. 1 by mouth three times a day as needed for cough  Other Orders: Rapid Strep (60454)  Patient Instructions: 1)  Get plenty of rest, drink lots of clear liquids, and use Tylenol for fever and comfort. Gargle with salt water for your throat and use the tessalon as needed for cough.  Let us know if you aren't gradually improving.  Prescriptions: TESSALON 200 MG CAPS (BENZONATATE) 1 by mouth three times a day as needed for cough  #30 x 1   Entered and Authorized by:   Crawford Givens MD   Signed by:   Crawford Givens MD on 06/20/2010   Method used:   [facility]        [facility] [phone][facility] (retail)       [address]  [place], [place]  [address]       Ph: [phone] or [phone]       Fax: [phone]   RxID:   [id number]    Orders Added: 1)  Est. Patient Level III [60630] 2)  Rapid Strep [16010] 3)  Prescription Created Electronically [phone]    Current Allergies (reviewed today): No known allergies   Laboratory Results  Date/Time Received: June 20, 2010 10:11 AM   Other Tests  Rapid Strep: negative

## 2010-07-22 ENCOUNTER — Encounter (INDEPENDENT_AMBULATORY_CARE_PROVIDER_SITE_OTHER): Payer: Self-pay | Admitting: *Deleted

## 2010-07-22 ENCOUNTER — Other Ambulatory Visit: Payer: Self-pay | Admitting: Family Medicine

## 2010-07-22 ENCOUNTER — Other Ambulatory Visit (INDEPENDENT_AMBULATORY_CARE_PROVIDER_SITE_OTHER): Payer: 59

## 2010-07-22 DIAGNOSIS — E119 Type 2 diabetes mellitus without complications: Secondary | ICD-10-CM

## 2010-07-29 ENCOUNTER — Ambulatory Visit: Payer: Self-pay | Admitting: Family Medicine

## 2010-07-31 ENCOUNTER — Encounter: Payer: Self-pay | Admitting: Family Medicine

## 2010-07-31 ENCOUNTER — Ambulatory Visit (INDEPENDENT_AMBULATORY_CARE_PROVIDER_SITE_OTHER): Payer: 59 | Admitting: Family Medicine

## 2010-07-31 DIAGNOSIS — E119 Type 2 diabetes mellitus without complications: Secondary | ICD-10-CM

## 2010-07-31 LAB — HM DIABETES FOOT EXAM

## 2010-08-06 NOTE — Assessment & Plan Note (Signed)
Summary: 3 MONTH FOLLOW UP AFTER LABS   Vital Signs:  Patient profile:   50 year old female Height:      68 inches Weight:      294.75 pounds BMI:     44.98 Temp:     97.6 degrees F oral Pulse rate:   80 / minute Pulse rhythm:   regular BP sitting:   136 / 90  (left arm) Cuff size:   large  Vitals Entered By: Delilah Shan CMA  Dull) (July 31, 2010 9:27 AM) CC: 3 months follow up    History of Present Illness: Diabetes:  Using medications without difficulties:yes- diarrhea has resolved now Hypoglycemic episodes:no Hyperglycemic episodes:no Feet problems:no Blood Sugars averaging:  ~120 eye exam within last year: yes working on diet, cutting carbs and sugar is improving as she loses weight.   She is eating out for lunch but really working on avoiding carbs.  This is working better than packing her lunch.  Leaving for lunch allows her time to eat.   Feeling better as she loses weight.   Exercise: some but 'not enough'.  She hopes the medial knee pain will improve with weight loss and allow more exercise.   ?panic attack/reaction with prev flu shot  ~10 years ago but then has had mult flu shots since then w/o similar event.  We talked about this today.  Since she didn't have typical allergic symptoms, has tolerated the shot in the meantime, and isn't allergic to chicken/eggs/feathers, then it should be safe to continue with routine flu vaccine.    She is looking for a gynecologist and considering follow up with them.    Allergies: No Known Drug Allergies  Past History:  Past Medical History: Last updated: 01/17/2010 Depression/panic attacks/ prev dx BAD2-Dr. Madaline Guthrie Hypertension DM2 HLD  Review of Systems       See HPI.  Otherwise negative.    Physical Exam  General:  GEN: nad, alert and oriented, obese HEENT: mucous membranes moist NECK: supple w/o LA CV: rrr. PULM: ctab, no inc wob ABD: soft, +bs EXT: no edema   Diabetes Management Exam:    Foot Exam  (with socks and/or shoes not present):       Sensory-Pinprick/Light touch:          Left medial foot (L-4): normal          Left dorsal foot (L-5): normal          Left lateral foot (S-1): normal          Right medial foot (L-4): normal          Right dorsal foot (L-5): normal          Right lateral foot (S-1): normal       Sensory-Monofilament:          Left foot: normal          Right foot: normal       Inspection:          Left foot: normal          Right foot: normal       Nails:          Left foot: normal          Right foot: normal   Impression & Recommendations:  Problem # 1:  AODM (ICD-250.00) Improved, continue to work on diet/weight and then hopefully get back into exercising.  She agrees.  recheck A1c in 3 months.  I thanked her for  her effort.  Her updated medication list for this problem includes:    Glucophage 500 Mg Tabs (Metformin hcl) .Marland Kitchen... 2 tabs two times a day    Prinivil 10 Mg Tabs (Lisinopril) .Marland Kitchen... 1 by mouth qday  Complete Medication List: 1)  Lamictal 25 Mg Tbdp (Lamotrigine) .Marland Kitchen.. 10 tabs by mouth once daily.  currently at 150 mg. once daily. 2)  Xanax 0.5 Mg Tabs (Alprazolam) .... Take one by mouth three times a day as needed 3)  Pristiq 50 Mg Xr24h-tab (Desvenlafaxine succinate) .Marland Kitchen.. 1 daily by mouth 4)  Multivitamins Tabs (Multiple vitamin) .... Take 1 tablet by mouth once a day 5)  Glucophage 500 Mg Tabs (Metformin hcl) .... 2 tabs two times a day 6)  Zocor 40 Mg Tabs (Simvastatin) .... One tab at night 7)  Metoprolol Tartrate 25 Mg Tabs (Metoprolol tartrate) .... Take 1 two times a day for bp and heart rate 8)  Accucheck Aviva Glucometer  .... Use to check blood sugars 1-2 times daily 9)  Accucheck Aviva Test Strips  .... Check blood sugar 1-2 times daily as directed 10)  Prinivil 10 Mg Tabs (Lisinopril) .Marland Kitchen.. 1 by mouth qday  Patient Instructions: 1)  I would get a flu shot.   2)  3 month follow up appointment with A1c a few days before- 250.00.    3)  Take care and keep working on your diet. I appreciate your effort.    Orders Added: 1)  Est. Patient Level III [04540]    Current Allergies (reviewed today): No known allergies

## 2010-08-20 ENCOUNTER — Encounter (INDEPENDENT_AMBULATORY_CARE_PROVIDER_SITE_OTHER): Payer: Self-pay | Admitting: *Deleted

## 2010-08-25 ENCOUNTER — Encounter: Payer: Self-pay | Admitting: Family Medicine

## 2010-08-26 NOTE — Miscellaneous (Signed)
Summary: med list update- test strips  Clinical Lists Changes  Medications: Changed medication from * ACCUCHECK AVIVA TEST STRIPS check blood sugar 1-2 times daily as directed to ACCU-CHEK AVIVA  STRP (GLUCOSE BLOOD) check blood sugar 1-2 times daily as directed     Prior Medications: LAMICTAL 25 MG  TBDP (LAMOTRIGINE) 10 tabs by mouth once daily.  Currently at 150 mg. once daily. XANAX 0.5 MG  TABS (ALPRAZOLAM) Take one by mouth three times a day as needed PRISTIQ 50 MG XR24H-TAB (DESVENLAFAXINE SUCCINATE) 1 daily by mouth MULTIVITAMINS   TABS (MULTIPLE VITAMIN) Take 1 tablet by mouth once a day GLUCOPHAGE 500 MG TABS (METFORMIN HCL) 2 tabs two times a day ZOCOR 40 MG TABS (SIMVASTATIN) one tab at night METOPROLOL TARTRATE 25 MG TABS (METOPROLOL TARTRATE) take 1 two times a day for BP and heart rate ACCUCHECK AVIVA GLUCOMETER () use to check blood sugars 1-2 times daily ACCU-CHEK AVIVA  STRP (GLUCOSE BLOOD) check blood sugar 1-2 times daily as directed PRINIVIL 10 MG TABS (LISINOPRIL) 1 by mouth qday Current Allergies: No known allergies

## 2010-08-30 ENCOUNTER — Encounter: Payer: Self-pay | Admitting: Family Medicine

## 2010-09-03 ENCOUNTER — Other Ambulatory Visit: Payer: Self-pay | Admitting: Family Medicine

## 2010-09-03 MED ORDER — METFORMIN HCL 500 MG PO TABS: ORAL_TABLET | ORAL | Status: DC

## 2010-09-04 ENCOUNTER — Telehealth: Payer: Self-pay | Admitting: *Deleted

## 2010-09-04 NOTE — Telephone Encounter (Signed)
I will address this tomorrow.

## 2010-09-04 NOTE — Telephone Encounter (Signed)
Pharmacy is asking if the script that was sent in yesterday for metformin is to replace the script from 3/20 for one twice a day.  Pt picked up that script yesterday.  Note from pharmacy is on your desk.

## 2010-10-13 ENCOUNTER — Other Ambulatory Visit: Payer: Self-pay | Admitting: Family Medicine

## 2010-10-13 DIAGNOSIS — E119 Type 2 diabetes mellitus without complications: Secondary | ICD-10-CM

## 2010-10-16 ENCOUNTER — Other Ambulatory Visit: Payer: 59

## 2010-10-23 ENCOUNTER — Ambulatory Visit: Payer: 59 | Admitting: Family Medicine

## 2010-11-18 ENCOUNTER — Other Ambulatory Visit (INDEPENDENT_AMBULATORY_CARE_PROVIDER_SITE_OTHER): Payer: 59 | Admitting: Family Medicine

## 2010-11-18 DIAGNOSIS — E119 Type 2 diabetes mellitus without complications: Secondary | ICD-10-CM

## 2010-11-20 ENCOUNTER — Ambulatory Visit (INDEPENDENT_AMBULATORY_CARE_PROVIDER_SITE_OTHER): Payer: 59 | Admitting: Family Medicine

## 2010-11-20 ENCOUNTER — Encounter: Payer: Self-pay | Admitting: Family Medicine

## 2010-11-20 VITALS — BP 138/92 | HR 76 | Temp 98.3°F | Wt 299.1 lb

## 2010-11-20 DIAGNOSIS — E119 Type 2 diabetes mellitus without complications: Secondary | ICD-10-CM

## 2010-11-20 DIAGNOSIS — M549 Dorsalgia, unspecified: Secondary | ICD-10-CM

## 2010-11-20 HISTORY — DX: Dorsalgia, unspecified: M54.9

## 2010-11-20 NOTE — Patient Instructions (Signed)
Keep trying to work on your diet and weight.  Stretch you back (single and double knee to chest).  Don't change your metformin.  Come back for labs in 10/12 and get a OV a few days later.  Take care.
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
## 2010-11-20 NOTE — Progress Notes (Signed)
She is changing jobs and this will be a good change.  Her disabled son is finishing school and she's trying to make arrangements for him.  Both situations have been tough for her, but "I see things getting better".  "Before I wouldn't have been able to cope, but I'm doing better now."    Diabetes:  Using medications without difficulties:yes Hypoglycemic episodes:no Hyperglycemic episodes:no Feet problems:no Blood Sugars averaging: AM 120-135 It has been tough to adhere to diet/exercise with the recent upheaval in the last few months. She has had fewer swings in blood sugar.    Occ lower back pain, paraspinal, w/o radiation.  "It will feel tight."  PMH and SH reviewed  Meds, vitals, and allergies reviewed.   ROS: See HPI.  Otherwise negative.    GEN: nad, alert and oriented HEENT: mucous membranes moist NECK: supple w/o LA CV: rrr. PULM: ctab, no inc wob ABD: soft, +bs EXT: no edema SKIN: no acute rash  Diabetic foot exam: Normal inspection No skin breakdown No calluses  Normal DP pulses Normal sensation to light touch and monofilament Nails normal

## 2010-11-20 NOTE — Assessment & Plan Note (Signed)
Mild lower L spine paraspinal tenderness but no midline tenderness.  D/w pt about weight and stretching- single and double knee to chest.  Fu prn.  She understood.

## 2011-01-26 ENCOUNTER — Other Ambulatory Visit: Payer: Self-pay | Admitting: Family Medicine

## 2011-03-02 ENCOUNTER — Emergency Department: Payer: Self-pay | Admitting: *Deleted

## 2011-03-06 ENCOUNTER — Encounter: Payer: Self-pay | Admitting: Family Medicine

## 2011-03-06 ENCOUNTER — Encounter: Payer: Self-pay | Admitting: *Deleted

## 2011-03-06 ENCOUNTER — Ambulatory Visit (INDEPENDENT_AMBULATORY_CARE_PROVIDER_SITE_OTHER): Payer: 59 | Admitting: Family Medicine

## 2011-03-06 VITALS — BP 130/82 | HR 84 | Temp 99.0°F | Wt 303.5 lb

## 2011-03-06 DIAGNOSIS — S2000XA Contusion of breast, unspecified breast, initial encounter: Secondary | ICD-10-CM

## 2011-03-06 DIAGNOSIS — N6489 Other specified disorders of breast: Secondary | ICD-10-CM

## 2011-03-06 MED ORDER — CYCLOBENZAPRINE HCL 10 MG PO TABS: 10.0000 mg | ORAL_TABLET | Freq: Three times a day (TID) | ORAL | Status: DC | PRN

## 2011-03-06 MED ORDER — HYDROCODONE-ACETAMINOPHEN 5-325 MG PO TABS: 1.0000 | ORAL_TABLET | Freq: Four times a day (QID) | ORAL | Status: AC | PRN
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
## 2011-03-06 NOTE — Patient Instructions (Signed)
Out of work for the next week.  Return in 1 week for follow up. Blood work today. Use cool compresses for symptomatic relief. Good to see you today.  Refilled vicodin today.  Start flexeril when finished with norflex (both may make you sleepy so don't drive when taking). Please return sooner or seek urgent care if any worsening instead of improving.

## 2011-03-07 ENCOUNTER — Other Ambulatory Visit: Payer: Self-pay | Admitting: Family Medicine

## 2011-03-07 DIAGNOSIS — S2000XA Contusion of breast, unspecified breast, initial encounter: Secondary | ICD-10-CM | POA: Insufficient documentation

## 2011-03-07 DIAGNOSIS — N6489 Other specified disorders of breast: Secondary | ICD-10-CM | POA: Insufficient documentation

## 2011-03-07 LAB — CBC WITH DIFFERENTIAL/PLATELET
Basophils Absolute: 0 10*3/uL (ref 0.0–0.1)
Basophils Relative: 0 % (ref 0–1)
Eosinophils Absolute: 0.2 10*3/uL (ref 0.0–0.7)
Eosinophils Relative: 2 % (ref 0–5)
HCT: 31.7 % — ABNORMAL LOW (ref 36.0–46.0)
Hemoglobin: 9.8 g/dL — ABNORMAL LOW (ref 12.0–15.0)
MCH: 29.5 pg (ref 26.0–34.0)
MCHC: 30.9 g/dL (ref 30.0–36.0)
MCV: 95.5 fL (ref 78.0–100.0)
Monocytes Absolute: 0.9 10*3/uL (ref 0.1–1.0)
Monocytes Relative: 9 % (ref 3–12)
Neutro Abs: 5.8 10*3/uL (ref 1.7–7.7)
RDW: 13.6 % (ref 11.5–15.5)

## 2011-03-07 LAB — BASIC METABOLIC PANEL
BUN: 11 mg/dL (ref 6–23)
Creat: 0.55 mg/dL (ref 0.50–1.10)
Glucose, Bld: 174 mg/dL — ABNORMAL HIGH (ref 70–99)
Potassium: 4.3 mEq/L (ref 3.5–5.3)

## 2011-03-07 LAB — CK: Total CK: 39 U/L (ref 7–177)
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
## 2011-03-13 ENCOUNTER — Encounter: Payer: Self-pay | Admitting: Family Medicine

## 2011-03-13 ENCOUNTER — Ambulatory Visit (INDEPENDENT_AMBULATORY_CARE_PROVIDER_SITE_OTHER): Payer: 59 | Admitting: Family Medicine

## 2011-03-13 DIAGNOSIS — S2000XA Contusion of breast, unspecified breast, initial encounter: Secondary | ICD-10-CM

## 2011-03-13 DIAGNOSIS — M533 Sacrococcygeal disorders, not elsewhere classified: Secondary | ICD-10-CM | POA: Insufficient documentation

## 2011-03-13 DIAGNOSIS — N6489 Other specified disorders of breast: Secondary | ICD-10-CM

## 2011-03-13 DIAGNOSIS — M79609 Pain in unspecified limb: Secondary | ICD-10-CM

## 2011-03-13 DIAGNOSIS — M79642 Pain in left hand: Secondary | ICD-10-CM

## 2011-03-13 MED ORDER — NAPROXEN 500 MG PO TABS: ORAL_TABLET | ORAL | Status: AC
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
## 2011-03-14 ENCOUNTER — Encounter: Payer: Self-pay | Admitting: Family Medicine

## 2011-03-14 DIAGNOSIS — M79642 Pain in left hand: Secondary | ICD-10-CM | POA: Insufficient documentation

## 2011-03-14 LAB — CBC WITH DIFFERENTIAL/PLATELET
Basophils Relative: 0 % (ref 0–1)
Lymphs Abs: 3.9 10*3/uL (ref 0.7–4.0)
MCHC: 31.7 g/dL (ref 30.0–36.0)
MCV: 93.7 fL (ref 78.0–100.0)
Monocytes Absolute: 1.1 10*3/uL — ABNORMAL HIGH (ref 0.1–1.0)
Monocytes Relative: 9 % (ref 3–12)
Neutro Abs: 7.4 10*3/uL (ref 1.7–7.7)
RBC: 3.94 MIL/uL (ref 3.87–5.11)
WBC: 12.7 10*3/uL — ABNORMAL HIGH (ref 4.0–10.5)

## 2011-03-14 NOTE — Assessment & Plan Note (Signed)
Anticipate strain vs fragment.  No xray today as wouldn't change management.  May need more time, buddy taped fingers together. Return sooner if worsening for further evaluation.  Take NSAIDs and ice/heat.
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
## 2011-03-14 NOTE — Assessment & Plan Note (Signed)
Return Monday for xray of coccyx as no xray available today. Offered sending out today for xray, pt opts to wait until Monday. Start NSAIDs, recommend donut cushion as well as ice/heat.

## 2011-03-16 ENCOUNTER — Ambulatory Visit (INDEPENDENT_AMBULATORY_CARE_PROVIDER_SITE_OTHER)
Admission: RE | Admit: 2011-03-16 | Discharge: 2011-03-16 | Disposition: A | Discharge status: Home / Self Care | Payer: 59 | Source: Ambulatory Visit | Attending: Family Medicine | Admitting: Family Medicine

## 2011-03-16 DIAGNOSIS — M533 Sacrococcygeal disorders, not elsewhere classified: Secondary | ICD-10-CM

## 2011-03-18 ENCOUNTER — Encounter: Payer: Self-pay | Admitting: Family Medicine

## 2011-03-18 ENCOUNTER — Encounter: Payer: Self-pay | Admitting: *Deleted

## 2011-03-18 ENCOUNTER — Ambulatory Visit (INDEPENDENT_AMBULATORY_CARE_PROVIDER_SITE_OTHER): Payer: 59 | Admitting: Family Medicine

## 2011-03-18 DIAGNOSIS — N6489 Other specified disorders of breast: Secondary | ICD-10-CM

## 2011-03-18 DIAGNOSIS — S2000XA Contusion of breast, unspecified breast, initial encounter: Secondary | ICD-10-CM

## 2011-03-18 DIAGNOSIS — M533 Sacrococcygeal disorders, not elsewhere classified: Secondary | ICD-10-CM

## 2011-03-18 NOTE — Assessment & Plan Note (Signed)
Anticipate coccyx contusion.   Xray negative for fracture. Continue supportive care, donut cushion use. May return to work part time starting next week on Tuesday.  Provided with letter for this.
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
## 2011-03-18 NOTE — Patient Instructions (Signed)
Continue current treatment plan. We will call you with results of ultrasound.
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
## 2011-03-20 ENCOUNTER — Other Ambulatory Visit: Payer: Self-pay | Admitting: *Deleted

## 2011-03-20 MED ORDER — SIMVASTATIN 40 MG PO TABS: 40.0000 mg | ORAL_TABLET | Freq: Every day | ORAL | Status: DC

## 2011-03-23 ENCOUNTER — Ambulatory Visit
Admission: RE | Admit: 2011-03-23 | Discharge: 2011-03-23 | Disposition: A | Discharge status: Home / Self Care | Payer: 59 | Source: Ambulatory Visit | Attending: Family Medicine | Admitting: Family Medicine

## 2011-03-23 ENCOUNTER — Other Ambulatory Visit: Payer: Self-pay | Admitting: Family Medicine

## 2011-03-23 DIAGNOSIS — S2000XA Contusion of breast, unspecified breast, initial encounter: Secondary | ICD-10-CM

## 2011-03-23 DIAGNOSIS — N6489 Other specified disorders of breast: Secondary | ICD-10-CM

## 2011-03-24 ENCOUNTER — Telehealth: Payer: Self-pay | Admitting: *Deleted

## 2011-03-24 NOTE — Telephone Encounter (Signed)
Pt had mammogram, ultrasound and aspiration done yesterday and she is asking to have her work note extended through today.  She will go back to work part time tomorrow.  She still has pain and her breast is still draining.  Please call when note is ready.

## 2011-03-24 NOTE — Telephone Encounter (Signed)
Please give to pt.  Thanks.   

## 2011-03-24 NOTE — Telephone Encounter (Signed)
Patient advised. Note left at front desk for pick up.  

## 2011-03-25 ENCOUNTER — Other Ambulatory Visit: Payer: 59

## 2011-03-31 ENCOUNTER — Other Ambulatory Visit: Payer: 59

## 2011-04-03 ENCOUNTER — Ambulatory Visit: Payer: 59 | Admitting: Family Medicine

## 2011-04-06 ENCOUNTER — Other Ambulatory Visit: Payer: Self-pay | Admitting: Family Medicine

## 2011-04-06 ENCOUNTER — Ambulatory Visit
Admission: RE | Admit: 2011-04-06 | Discharge: 2011-04-06 | Disposition: A | Discharge status: Home / Self Care | Payer: 59 | Source: Ambulatory Visit | Attending: Family Medicine | Admitting: Family Medicine

## 2011-04-06 DIAGNOSIS — S2000XA Contusion of breast, unspecified breast, initial encounter: Secondary | ICD-10-CM

## 2011-04-06 DIAGNOSIS — N6489 Other specified disorders of breast: Secondary | ICD-10-CM

## 2011-04-20 ENCOUNTER — Ambulatory Visit
Admission: RE | Admit: 2011-04-20 | Discharge: 2011-04-20 | Disposition: A | Discharge status: Home / Self Care | Payer: 59 | Source: Ambulatory Visit | Attending: Family Medicine | Admitting: Family Medicine

## 2011-04-20 ENCOUNTER — Other Ambulatory Visit: Payer: Self-pay | Admitting: Family Medicine

## 2011-04-20 DIAGNOSIS — N6489 Other specified disorders of breast: Secondary | ICD-10-CM

## 2011-04-20 DIAGNOSIS — S2000XA Contusion of breast, unspecified breast, initial encounter: Secondary | ICD-10-CM

## 2011-06-19 ENCOUNTER — Other Ambulatory Visit: Payer: Self-pay | Admitting: Family Medicine

## 2011-06-19 ENCOUNTER — Telehealth: Payer: Self-pay | Admitting: *Deleted

## 2011-06-19 ENCOUNTER — Ambulatory Visit: Payer: 59 | Admitting: *Deleted

## 2011-06-19 DIAGNOSIS — Z209 Contact with and (suspected) exposure to unspecified communicable disease: Secondary | ICD-10-CM

## 2011-06-19 NOTE — Telephone Encounter (Signed)
I'd get the titer and have her request the records.  Orders are in.

## 2011-06-19 NOTE — Telephone Encounter (Signed)
LMOVM as requested by patient.

## 2011-06-19 NOTE — Telephone Encounter (Signed)
Patient is starting a new job and needs the Hep B injections.  We have record of her having had one Hep B here in 2009 but none after that.  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  She is not sure if she also received the Hep B series at that time.  We are sending for those records but she may need to be more proactive in getting this done rather than waiting for the records.  Her question is:  If she has had the series in the past, will it be a problem for her to receive it again or can she get a titer to satisfy the requirement?

## 2011-06-22 ENCOUNTER — Other Ambulatory Visit: Payer: Self-pay | Admitting: *Deleted

## 2011-06-22 MED ORDER — SIMVASTATIN 40 MG PO TABS: 40.0000 mg | ORAL_TABLET | Freq: Every day | ORAL | Status: DC

## 2011-07-13 ENCOUNTER — Other Ambulatory Visit: Payer: Self-pay | Admitting: *Deleted

## 2011-07-13 MED ORDER — LISINOPRIL 10 MG PO TABS: 10.0000 mg | ORAL_TABLET | Freq: Every day | ORAL | Status: DC

## 2011-08-11 ENCOUNTER — Ambulatory Visit (INDEPENDENT_AMBULATORY_CARE_PROVIDER_SITE_OTHER)
Admission: RE | Admit: 2011-08-11 | Discharge: 2011-08-11 | Disposition: A | Discharge status: Home / Self Care | Payer: 59 | Source: Ambulatory Visit | Attending: Family Medicine | Admitting: Family Medicine

## 2011-08-11 ENCOUNTER — Ambulatory Visit (INDEPENDENT_AMBULATORY_CARE_PROVIDER_SITE_OTHER): Payer: 59 | Admitting: Family Medicine

## 2011-08-11 ENCOUNTER — Encounter: Payer: Self-pay | Admitting: Family Medicine

## 2011-08-11 VITALS — BP 132/84 | HR 92 | Temp 99.0°F | Wt 300.8 lb

## 2011-08-11 DIAGNOSIS — M25519 Pain in unspecified shoulder: Secondary | ICD-10-CM

## 2011-08-11 DIAGNOSIS — R059 Cough, unspecified: Secondary | ICD-10-CM

## 2011-08-11 DIAGNOSIS — R05 Cough: Secondary | ICD-10-CM

## 2011-08-11 NOTE — Patient Instructions (Addendum)
Wear the sling and schedule a follow up appointment with Dr. Patsy Lager in 7-10 days.   Drink plenty of fluids, take tylenol as needed, and gargle with warm salt water for your throat.  This should gradually improve.  Take care.  Let us know if you have other concerns.

## 2011-08-11 NOTE — Progress Notes (Signed)
She has f/u re: breast hematoma pending.  She is improved overall from that.    duration of symptoms: 2-3 days Rhinorrhea: some Congestion: some ear pain: no sore throat: yes Cough: yes, a little sputum, white/yellow Myalgias: some She is hoarse Fevers noted last few nights .  Her son is disabled and has a knee injury.  She was helping him but felt a pop in L shoulder about 1 week ago.  Still with normal rom in the shoulder.  Intermittent pain, pain laying on L side or pushing with L hand.    ROS: See HPI.  Otherwise negative.    Meds, vitals, and allergies reviewed.   GEN: nad, alert and oriented HEENT: mucous membranes moist, TM w/o erythema, nasal epithelium injected, OP with cobblestoning NECK: supple w/o LA CV: rrr. PULM: ctab, no inc wob ABD: soft, +bs EXT: no edema L shoulder with normal ROM and no cuff impingement but tender to palp at L AC joint, AC is tender on testing.   Films reviewed.

## 2011-08-13 DIAGNOSIS — M25519 Pain in unspecified shoulder: Secondary | ICD-10-CM

## 2011-08-13 HISTORY — DX: Pain in unspecified shoulder: M25.519

## 2011-08-13 NOTE — Assessment & Plan Note (Signed)
Likely viral, nontoxic, fu prn.  ddx d/w pt. She agreed, supportive tx.

## 2011-08-13 NOTE — Assessment & Plan Note (Signed)
Negative plain films, improved in sling.  With use sling for now and then f/u with Dr. Patsy Lager.  Appreciate his help in caring for patient. Anatomy d/w pt.  >25 min spent with face to face with patient, >50% counseling and/or coordinating care.

## 2011-08-17 ENCOUNTER — Ambulatory Visit: Payer: 59 | Admitting: Family Medicine

## 2011-08-25 ENCOUNTER — Encounter: Payer: Self-pay | Admitting: Family Medicine

## 2011-08-25 DIAGNOSIS — R7611 Nonspecific reaction to tuberculin skin test without active tuberculosis: Secondary | ICD-10-CM | POA: Insufficient documentation

## 2011-09-28 ENCOUNTER — Other Ambulatory Visit: Payer: Self-pay | Admitting: *Deleted

## 2011-09-28 MED ORDER — GLUCOSE BLOOD VI STRP: ORAL_STRIP | Status: DC

## 2011-09-28 NOTE — Telephone Encounter (Signed)
Due for labs now.  Future orders are in.  OV.  rx sent.

## 2011-09-28 NOTE — Telephone Encounter (Addendum)
Received faxed refill request from pharmacy. Please advise when patient is due for lab work. Is it okay to refill medication?

## 2011-09-28 NOTE — Telephone Encounter (Signed)
LMOVM of the only contact number given.

## 2011-10-06 ENCOUNTER — Other Ambulatory Visit (INDEPENDENT_AMBULATORY_CARE_PROVIDER_SITE_OTHER): Payer: 59

## 2011-10-06 DIAGNOSIS — E119 Type 2 diabetes mellitus without complications: Secondary | ICD-10-CM

## 2011-10-06 DIAGNOSIS — Z209 Contact with and (suspected) exposure to unspecified communicable disease: Secondary | ICD-10-CM

## 2011-10-06 LAB — COMPREHENSIVE METABOLIC PANEL
Albumin: 4.1 g/dL (ref 3.5–5.2)
Alkaline Phosphatase: 47 U/L (ref 39–117)
BUN: 10 mg/dL (ref 6–23)
CO2: 26 mEq/L (ref 19–32)
Calcium: 9.5 mg/dL (ref 8.4–10.5)
Chloride: 101 mEq/L (ref 96–112)
GFR: 147.3 mL/min (ref >60.00)
Glucose, Bld: 231 mg/dL — ABNORMAL HIGH (ref 70–99)
Potassium: 4.2 mEq/L (ref 3.5–5.1)

## 2011-10-06 LAB — LIPID PANEL
Cholesterol: 207 mg/dL — ABNORMAL HIGH (ref 0–200)
Triglycerides: 165 mg/dL — ABNORMAL HIGH (ref 0.0–149.0)

## 2011-10-06 LAB — LDL CHOLESTEROL, DIRECT: Direct LDL: 140.1 mg/dL

## 2011-10-08 ENCOUNTER — Encounter: Payer: Self-pay | Admitting: *Deleted

## 2011-10-08 ENCOUNTER — Ambulatory Visit: Payer: 59 | Admitting: Family Medicine

## 2011-10-12 ENCOUNTER — Ambulatory Visit: Payer: 59 | Admitting: Family Medicine

## 2011-10-16 ENCOUNTER — Ambulatory Visit (INDEPENDENT_AMBULATORY_CARE_PROVIDER_SITE_OTHER): Payer: BC Managed Care – PPO | Admitting: Family Medicine

## 2011-10-16 ENCOUNTER — Encounter: Payer: Self-pay | Admitting: Family Medicine

## 2011-10-16 VITALS — BP 132/96 | HR 88 | Temp 98.7°F | Wt 297.0 lb

## 2011-10-16 DIAGNOSIS — Z23 Encounter for immunization: Secondary | ICD-10-CM

## 2011-10-16 DIAGNOSIS — E119 Type 2 diabetes mellitus without complications: Secondary | ICD-10-CM

## 2011-10-16 MED ORDER — GLIPIZIDE 5 MG PO TABS: 5.0000 mg | ORAL_TABLET | Freq: Every day | ORAL | Status: DC

## 2011-10-16 NOTE — Patient Instructions (Addendum)
Try to stick with your diabetic diet.   Recheck labs in 3 months.   Take 1 metformin if you can.   Take glipizide with breakfast, one a day.  Come see me in 3 months.

## 2011-10-16 NOTE — Progress Notes (Signed)
DM2.  A1c is elevated to 9.5  She is intolerant of high dose metformin, has essentially been on no meds for DM2 in interval.  Exercise is limited.  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  She had been missing her simvastatin dose at night.  That likely led to lipid elevation.    We discussed all of this, med compliance, weight, diet, barriers to compliance, etc.   Meds, vitals, and allergies reviewed.   ROS: See HPI.  Otherwise, noncontributory.  nad ncat Mmm rrr ctab  Diabetic foot exam: Normal inspection No skin breakdown No calluses  Normal DP pulses Normal sensation to light touch and monofilament Nails normal

## 2011-10-19 NOTE — Assessment & Plan Note (Signed)
Try to take 500mg  metformin daily. Add on glucotrol, work on diet and weight.  D/w pt about her stressors and schedule.  She'll work on med compliance.

## 2011-12-22 ENCOUNTER — Other Ambulatory Visit: Payer: Self-pay | Admitting: *Deleted

## 2011-12-22 MED ORDER — LISINOPRIL 10 MG PO TABS: 10.0000 mg | ORAL_TABLET | Freq: Every day | ORAL | Status: DC

## 2012-01-12 ENCOUNTER — Other Ambulatory Visit (INDEPENDENT_AMBULATORY_CARE_PROVIDER_SITE_OTHER): Payer: BC Managed Care – PPO

## 2012-01-12 DIAGNOSIS — E119 Type 2 diabetes mellitus without complications: Secondary | ICD-10-CM

## 2012-01-12 LAB — HEMOGLOBIN A1C: Hgb A1c MFr Bld: 8.5 % — ABNORMAL HIGH (ref 4.6–6.5)

## 2012-01-21 ENCOUNTER — Ambulatory Visit: Payer: BC Managed Care – PPO | Admitting: Family Medicine

## 2012-01-22 ENCOUNTER — Ambulatory Visit (INDEPENDENT_AMBULATORY_CARE_PROVIDER_SITE_OTHER): Payer: BC Managed Care – PPO | Admitting: Family Medicine

## 2012-01-22 ENCOUNTER — Encounter: Payer: Self-pay | Admitting: Family Medicine

## 2012-01-22 VITALS — BP 130/86 | HR 92 | Temp 98.5°F | Wt 298.0 lb

## 2012-01-22 DIAGNOSIS — E119 Type 2 diabetes mellitus without complications: Secondary | ICD-10-CM

## 2012-01-22 DIAGNOSIS — Z23 Encounter for immunization: Secondary | ICD-10-CM

## 2012-01-22 MED ORDER — GLIPIZIDE 5 MG PO TABS: ORAL_TABLET | ORAL | Status: DC

## 2012-01-22 NOTE — Patient Instructions (Addendum)
Take 2 of the glucotrol a day and recheck your A1c in 3 months.  Come see me after that.  Take care.  Keep working on [employer].

## 2012-01-22 NOTE — Progress Notes (Signed)
She's still working on placement for her son but the situation is improved overall.  Diabetes:  Using medications without difficulties: intolerant of metformin (she can't tolerate this and still complete her work schedule) Hypoglycemic episodes:yes Hyperglycemic episodes:in the 200s after a meal, none >300 Feet problems:no Blood Sugars averaging: in AM ~120-170 eye exam within last year: due, she'll call about that A1c improved to 8.5.  Discussed.   She's trying to work on her diet and schedule for eating.  Her work continues to be stressful but her situation is improved.  Her outlook is bright and affect is normal.  Meds, vitals, and allergies reviewed.   ROS: See HPI.  Otherwise negative.    GEN: nad, alert and oriented HEENT: mucous membranes moist NECK: supple w/o LA CV: rrr. PULM: ctab, no inc wob ABD: soft, +bs EXT: no edema SKIN: no acute rash  Diabetic foot exam: Normal inspection No skin breakdown No calluses  Normal DP pulses Normal sensation to light touch and monofilament Nails normal

## 2012-01-22 NOTE — Assessment & Plan Note (Signed)
Improved, not yet at goal.  Increase glucotrol to 10mg  a day.  Continue work on diet and weight.  Recheck in 3 months.  She agrees.

## 2012-01-25 ENCOUNTER — Ambulatory Visit (INDEPENDENT_AMBULATORY_CARE_PROVIDER_SITE_OTHER): Payer: BC Managed Care – PPO | Admitting: Family Medicine

## 2012-01-25 ENCOUNTER — Encounter: Payer: Self-pay | Admitting: Family Medicine

## 2012-01-25 VITALS — BP 126/82 | HR 96 | Temp 98.8°F | Wt 298.0 lb

## 2012-01-25 DIAGNOSIS — H6980 Other specified disorders of Eustachian tube, unspecified ear: Secondary | ICD-10-CM

## 2012-01-25 MED ORDER — FLUTICASONE PROPIONATE 50 MCG/ACT NA SUSP: NASAL | Status: DC

## 2012-01-25 NOTE — Patient Instructions (Signed)
You can use nasal saline and gently try to "pop" your ears. If you don't improve, you can use flonase.

## 2012-01-25 NOTE — Progress Notes (Signed)
L ear ringing and pressure.  She can't clear the L ear.  This AM the ear feels 'closed' and has a humming sound.  No R ear pain, pressure, ringing.  Minimal rhinorrhea, minimal ST, no fever.  Minimal cough due to scratchy throat.  Sx ongoing over the last week, but were better by the time she came in for the last OV.  Worse in meantime.   Meds, vitals, and allergies reviewed.   ROS: See HPI.  Otherwise, noncontributory.  nad ncat TM w/poor TM movement on R, no movement on L TMs w/o erythema, canals wnl B Nasal exam stuffy OP wnl  Neck supple, no LA

## 2012-01-26 DIAGNOSIS — H6980 Other specified disorders of Eustachian tube, unspecified ear: Secondary | ICD-10-CM | POA: Insufficient documentation

## 2012-01-26 NOTE — Assessment & Plan Note (Signed)
Gentle valsalva to clear the tubes, nasal saline and flonase with nasal steroid cautions.  F/u prn.  Should resolve.

## 2012-04-25 ENCOUNTER — Other Ambulatory Visit: Payer: BC Managed Care – PPO

## 2012-04-29 ENCOUNTER — Ambulatory Visit: Payer: BC Managed Care – PPO | Admitting: Family Medicine

## 2012-06-14 ENCOUNTER — Other Ambulatory Visit: Payer: Self-pay | Admitting: *Deleted

## 2012-06-14 MED ORDER — METOPROLOL TARTRATE 25 MG PO TABS: 25.0000 mg | ORAL_TABLET | Freq: Two times a day (BID) | ORAL | Status: DC

## 2012-06-17 ENCOUNTER — Other Ambulatory Visit: Payer: Self-pay | Admitting: Family Medicine

## 2012-06-17 DIAGNOSIS — N63 Unspecified lump in unspecified breast: Secondary | ICD-10-CM

## 2012-06-20 ENCOUNTER — Other Ambulatory Visit: Payer: Self-pay | Admitting: *Deleted

## 2012-06-20 MED ORDER — METOPROLOL TARTRATE 25 MG PO TABS: 25.0000 mg | ORAL_TABLET | Freq: Two times a day (BID) | ORAL | Status: DC

## 2012-06-27 ENCOUNTER — Ambulatory Visit
Admission: RE | Admit: 2012-06-27 | Discharge: 2012-06-27 | Disposition: A | Discharge status: Home / Self Care | Payer: 59 | Source: Ambulatory Visit | Attending: Family Medicine | Admitting: Family Medicine

## 2012-06-27 DIAGNOSIS — N63 Unspecified lump in unspecified breast: Secondary | ICD-10-CM

## 2012-07-06 ENCOUNTER — Other Ambulatory Visit: Payer: Self-pay | Admitting: *Deleted

## 2012-07-06 MED ORDER — METOPROLOL TARTRATE 25 MG PO TABS: 25.0000 mg | ORAL_TABLET | Freq: Two times a day (BID) | ORAL | Status: DC

## 2012-07-20 ENCOUNTER — Other Ambulatory Visit: Payer: Self-pay | Admitting: *Deleted

## 2012-07-20 MED ORDER — LISINOPRIL 10 MG PO TABS: 10.0000 mg | ORAL_TABLET | Freq: Every day | ORAL | Status: DC

## 2012-07-20 MED ORDER — SIMVASTATIN 40 MG PO TABS: 40.0000 mg | ORAL_TABLET | Freq: Every day | ORAL | Status: DC

## 2012-07-21 ENCOUNTER — Encounter: Payer: Self-pay | Admitting: Family Medicine

## 2012-07-21 ENCOUNTER — Ambulatory Visit (INDEPENDENT_AMBULATORY_CARE_PROVIDER_SITE_OTHER): Payer: 59 | Admitting: Family Medicine

## 2012-07-21 VITALS — BP 118/90 | HR 78 | Temp 98.6°F

## 2012-07-21 DIAGNOSIS — J329 Chronic sinusitis, unspecified: Secondary | ICD-10-CM

## 2012-07-21 DIAGNOSIS — J029 Acute pharyngitis, unspecified: Secondary | ICD-10-CM

## 2012-07-21 DIAGNOSIS — IMO0002 Reserved for concepts with insufficient information to code with codable children: Secondary | ICD-10-CM

## 2012-07-21 DIAGNOSIS — E119 Type 2 diabetes mellitus without complications: Secondary | ICD-10-CM

## 2012-07-21 DIAGNOSIS — E1165 Type 2 diabetes mellitus with hyperglycemia: Secondary | ICD-10-CM

## 2012-07-21 DIAGNOSIS — IMO0001 Reserved for inherently not codable concepts without codable children: Secondary | ICD-10-CM

## 2012-07-21 LAB — POCT RAPID STREP A (OFFICE): Rapid Strep A Screen: NEGATIVE

## 2012-07-21 LAB — HEMOGLOBIN A1C: Hgb A1c MFr Bld: 10.9 % — ABNORMAL HIGH (ref 4.6–6.5)

## 2012-07-21 MED ORDER — AMOXICILLIN-POT CLAVULANATE 875-125 MG PO TABS: 1.0000 | ORAL_TABLET | Freq: Two times a day (BID) | ORAL | Status: DC

## 2012-07-21 NOTE — Progress Notes (Signed)
5 days of ST, fever, runny nose, chills.  No ear pain but her ears feel stuffy.  Mult sick contacts.  Some cough.  She thought she was getting better today but wanted the cough checked.  She was out of work this week.    Due for A1c.  She hasn't been on DM2 diet as she had planned.   Meds, vitals, and allergies reviewed.   ROS: See HPI.  Otherwise, noncontributory.  GEN: nad, alert and oriented HEENT: mucous membranes moist, tm w/o erythema, nasal exam w/o erythema, clear discharge noted,  OP with cobblestoning, sinuses ttp x4 NECK: supple w/o LA CV: rrr.   PULM: ctab, no inc wob EXT: no edema SKIN: no acute rash

## 2012-07-21 NOTE — Patient Instructions (Addendum)
Go to the lab on the way out.  We'll contact you with your lab report. Hold onto the antibiotics for now.  Start if you don't improve in the next few days.   Take care.

## 2012-07-22 DIAGNOSIS — J329 Chronic sinusitis, unspecified: Secondary | ICD-10-CM | POA: Insufficient documentation

## 2012-07-22 NOTE — Assessment & Plan Note (Signed)
Needs to work on diet and continue current meds.  Recheck in 3 months.

## 2012-07-22 NOTE — Assessment & Plan Note (Signed)
Improved today, hold abx and start if worsening, continued.  She agrees.  Nontoxic.

## 2012-07-25 ENCOUNTER — Encounter: Payer: Self-pay | Admitting: *Deleted

## 2012-10-17 ENCOUNTER — Other Ambulatory Visit: Payer: 59

## 2012-10-20 ENCOUNTER — Ambulatory Visit: Payer: 59 | Admitting: Family Medicine

## 2012-10-20 DIAGNOSIS — Z0289 Encounter for other administrative examinations: Secondary | ICD-10-CM

## 2012-11-23 ENCOUNTER — Ambulatory Visit (INDEPENDENT_AMBULATORY_CARE_PROVIDER_SITE_OTHER): Payer: 59 | Admitting: Family Medicine

## 2012-11-23 ENCOUNTER — Encounter: Payer: Self-pay | Admitting: Family Medicine

## 2012-11-23 VITALS — BP 140/90 | HR 100 | Temp 99.2°F | Wt 292.2 lb

## 2012-11-23 DIAGNOSIS — IMO0002 Reserved for concepts with insufficient information to code with codable children: Secondary | ICD-10-CM

## 2012-11-23 DIAGNOSIS — L723 Sebaceous cyst: Secondary | ICD-10-CM

## 2012-11-23 DIAGNOSIS — IMO0001 Reserved for inherently not codable concepts without codable children: Secondary | ICD-10-CM

## 2012-11-23 DIAGNOSIS — E1165 Type 2 diabetes mellitus with hyperglycemia: Secondary | ICD-10-CM

## 2012-11-23 MED ORDER — DOXYCYCLINE HYCLATE 100 MG PO TABS: 100.0000 mg | ORAL_TABLET | Freq: Two times a day (BID) | ORAL | Status: DC

## 2012-11-23 NOTE — Patient Instructions (Addendum)
Go to the lab on the way out.  We'll contact you with your lab report.  Take the antibiotics; start tonight.   Warm compress 2-3 times a day and try to express some material afterward.  Schedule a follow up on Friday; if better, cancel it.   Take care.

## 2012-11-23 NOTE — Progress Notes (Signed)
Lesion on back. Draining and sore.  Present for about 2 weeks or longer, draining for about 1 week.  Tender. No fevers.    Her sugar has been elevated on home checks.  Due for A1c.  Done today.   Meds, vitals, and allergies reviewed.   ROS: See HPI.  Otherwise, noncontributory.  nad Back with ~2cm lesion, appears to be a ruptured sebaceous cyst with serosanguinous fluid expressed.

## 2012-11-24 LAB — HEMOGLOBIN A1C: Hgb A1c MFr Bld: 10.1 % — ABNORMAL HIGH (ref 4.6–6.5)

## 2012-11-24 NOTE — Assessment & Plan Note (Signed)
Already draining.  Sig material expressed w/o full I&D.  Would start doxy and have her use warm compresses.  Recheck in ~2 days; if improving then cancel the OV.  If not improved, we can I&D. She agrees.  Nontoxic.

## 2012-11-24 NOTE — Assessment & Plan Note (Signed)
See notes on A1c. ?

## 2012-11-25 ENCOUNTER — Ambulatory Visit (INDEPENDENT_AMBULATORY_CARE_PROVIDER_SITE_OTHER): Payer: 59 | Admitting: Family Medicine

## 2012-11-25 ENCOUNTER — Encounter: Payer: Self-pay | Admitting: Family Medicine

## 2012-11-25 VITALS — BP 122/84 | HR 84 | Temp 98.4°F | Wt 290.5 lb

## 2012-11-25 DIAGNOSIS — E1165 Type 2 diabetes mellitus with hyperglycemia: Secondary | ICD-10-CM

## 2012-11-25 DIAGNOSIS — IMO0001 Reserved for inherently not codable concepts without codable children: Secondary | ICD-10-CM

## 2012-11-25 DIAGNOSIS — L723 Sebaceous cyst: Secondary | ICD-10-CM

## 2012-11-25 DIAGNOSIS — E119 Type 2 diabetes mellitus without complications: Secondary | ICD-10-CM

## 2012-11-25 DIAGNOSIS — IMO0002 Reserved for concepts with insufficient information to code with codable children: Secondary | ICD-10-CM

## 2012-11-25 MED ORDER — METFORMIN HCL 500 MG PO TABS: 500.0000 mg | ORAL_TABLET | Freq: Two times a day (BID) | ORAL | Status: DC

## 2012-11-25 NOTE — Progress Notes (Signed)
Lesion on back. Draining but less sore. Less tender. No fevers. On abx.    DM2.  a1c discussed.  Discussed insulin start.  She wants to try metformin once more as she may be able to tolerate GI side effects.  See plan.   Meds, vitals, and allergies reviewed.   ROS: See HPI. Otherwise, noncontributory.   nad  Back with ~2cm lesion, appears to be a ruptured sebaceous cyst with serosanguinous fluid expressed.  Less sore and irritated than prev.

## 2012-11-25 NOTE — Patient Instructions (Addendum)
Try to gradually increase the metformin, work on your diet and recheck A1c in 3 months. Take care.

## 2012-11-25 NOTE — Assessment & Plan Note (Signed)
She'll work on diet, weight.  Uptitrate metformin and recheck A1c in 3 months.  She agrees.  Approaching insulin start o/w.

## 2012-11-25 NOTE — Assessment & Plan Note (Signed)
Improving on abx, continue warm compresses and abx.  No need to I&D given the clinic improvement.  Pt agrees.

## 2012-11-28 NOTE — Addendum Note (Signed)
Addended by: Alvina Chou on: 11/28/2012 10:00 AM   Modules accepted: Orders

## 2012-12-30 ENCOUNTER — Other Ambulatory Visit: Payer: Self-pay | Admitting: Family Medicine

## 2012-12-30 DIAGNOSIS — N6310 Unspecified lump in the right breast, unspecified quadrant: Secondary | ICD-10-CM

## 2012-12-30 IMAGING — CR DG SACRUM/COCCYX 2+V
3 series · 3 of 3 positions shown · non-contrast
Comparison: None.

CLINICAL DATA: Negative.

SACRUM AND COCCYX - 2+ VIEW

[view not recorded (1 of 3)]
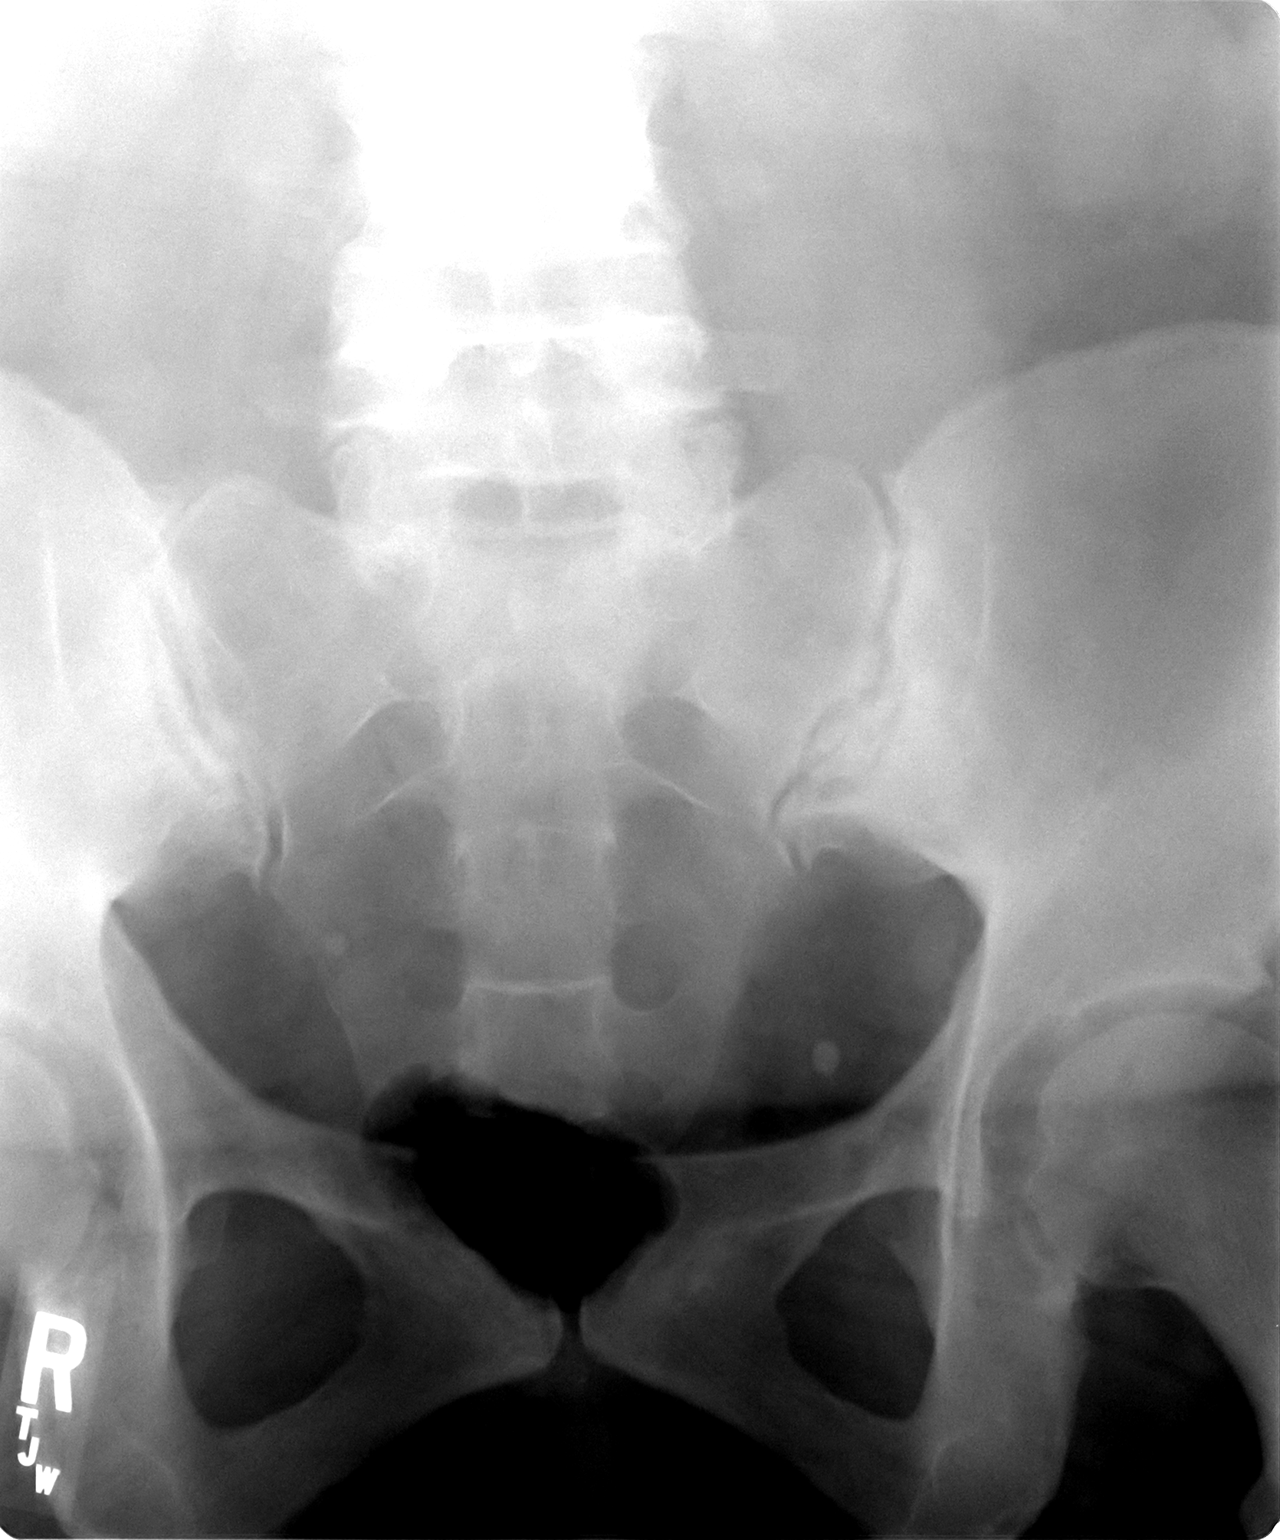

[view not recorded (2 of 3)]
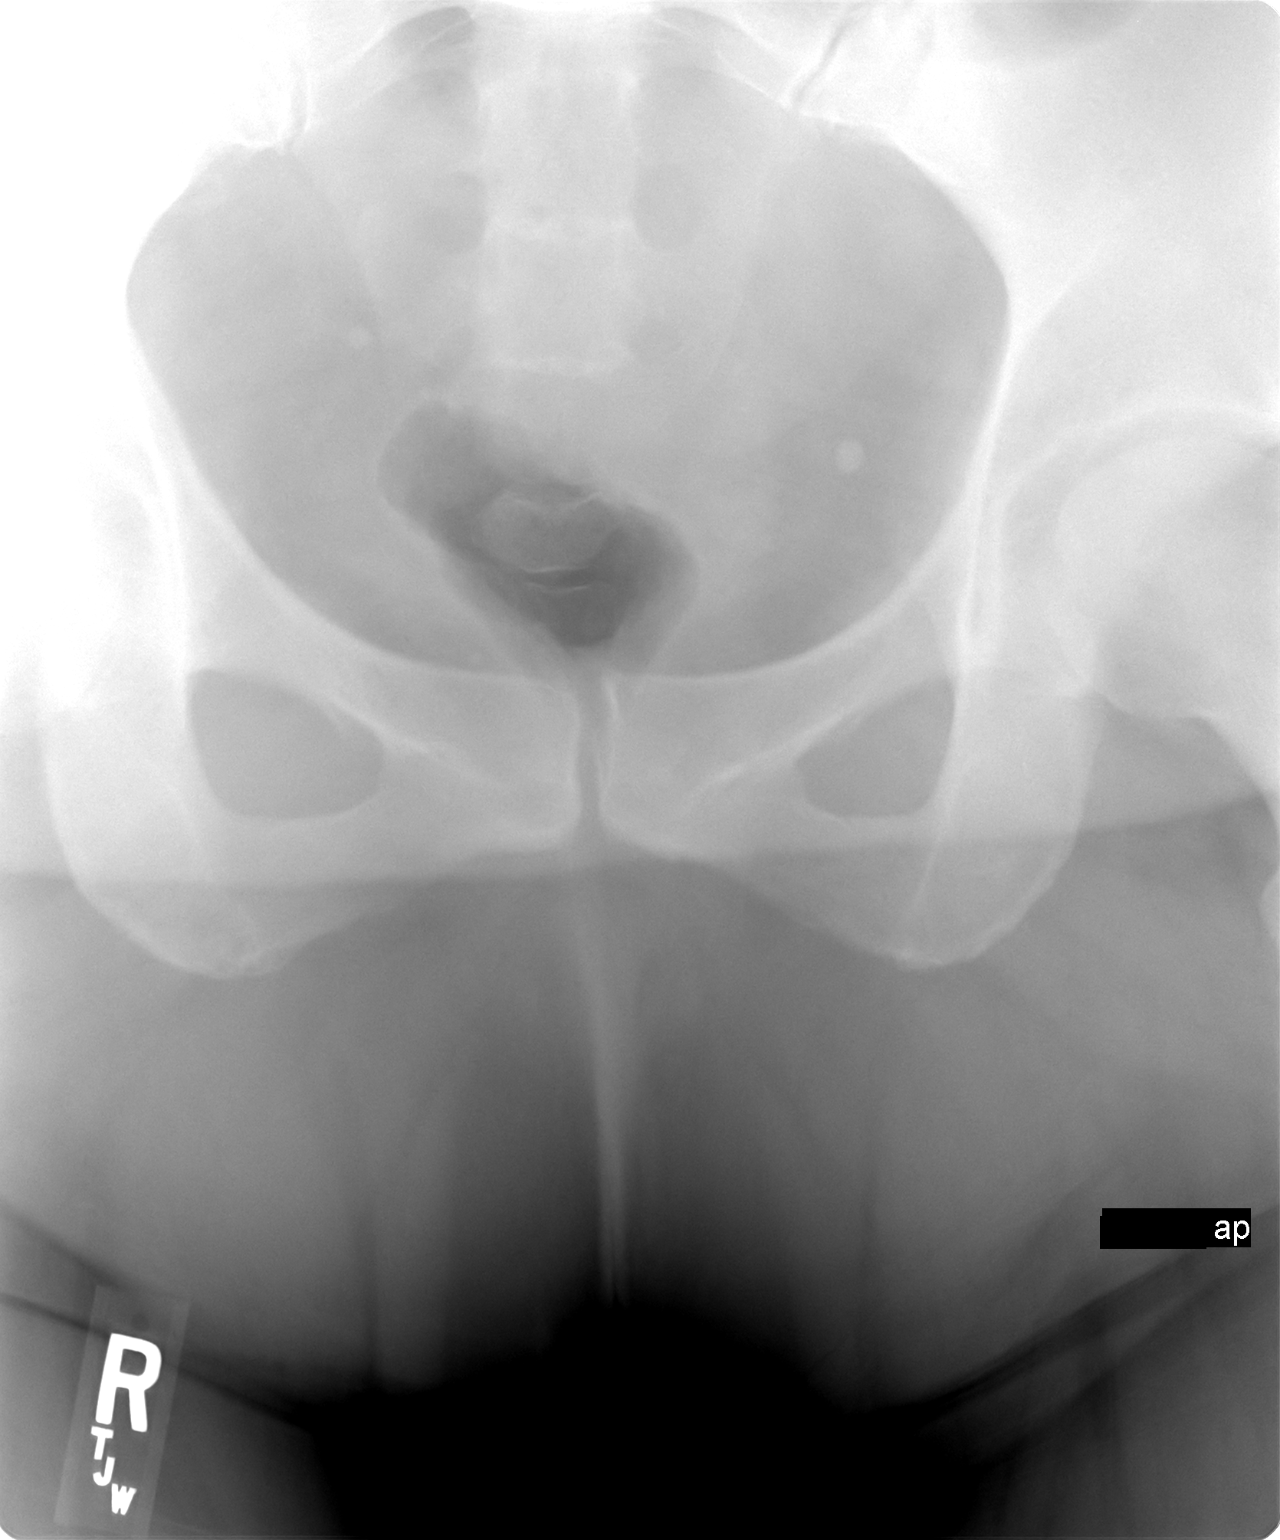

[view not recorded (3 of 3)]
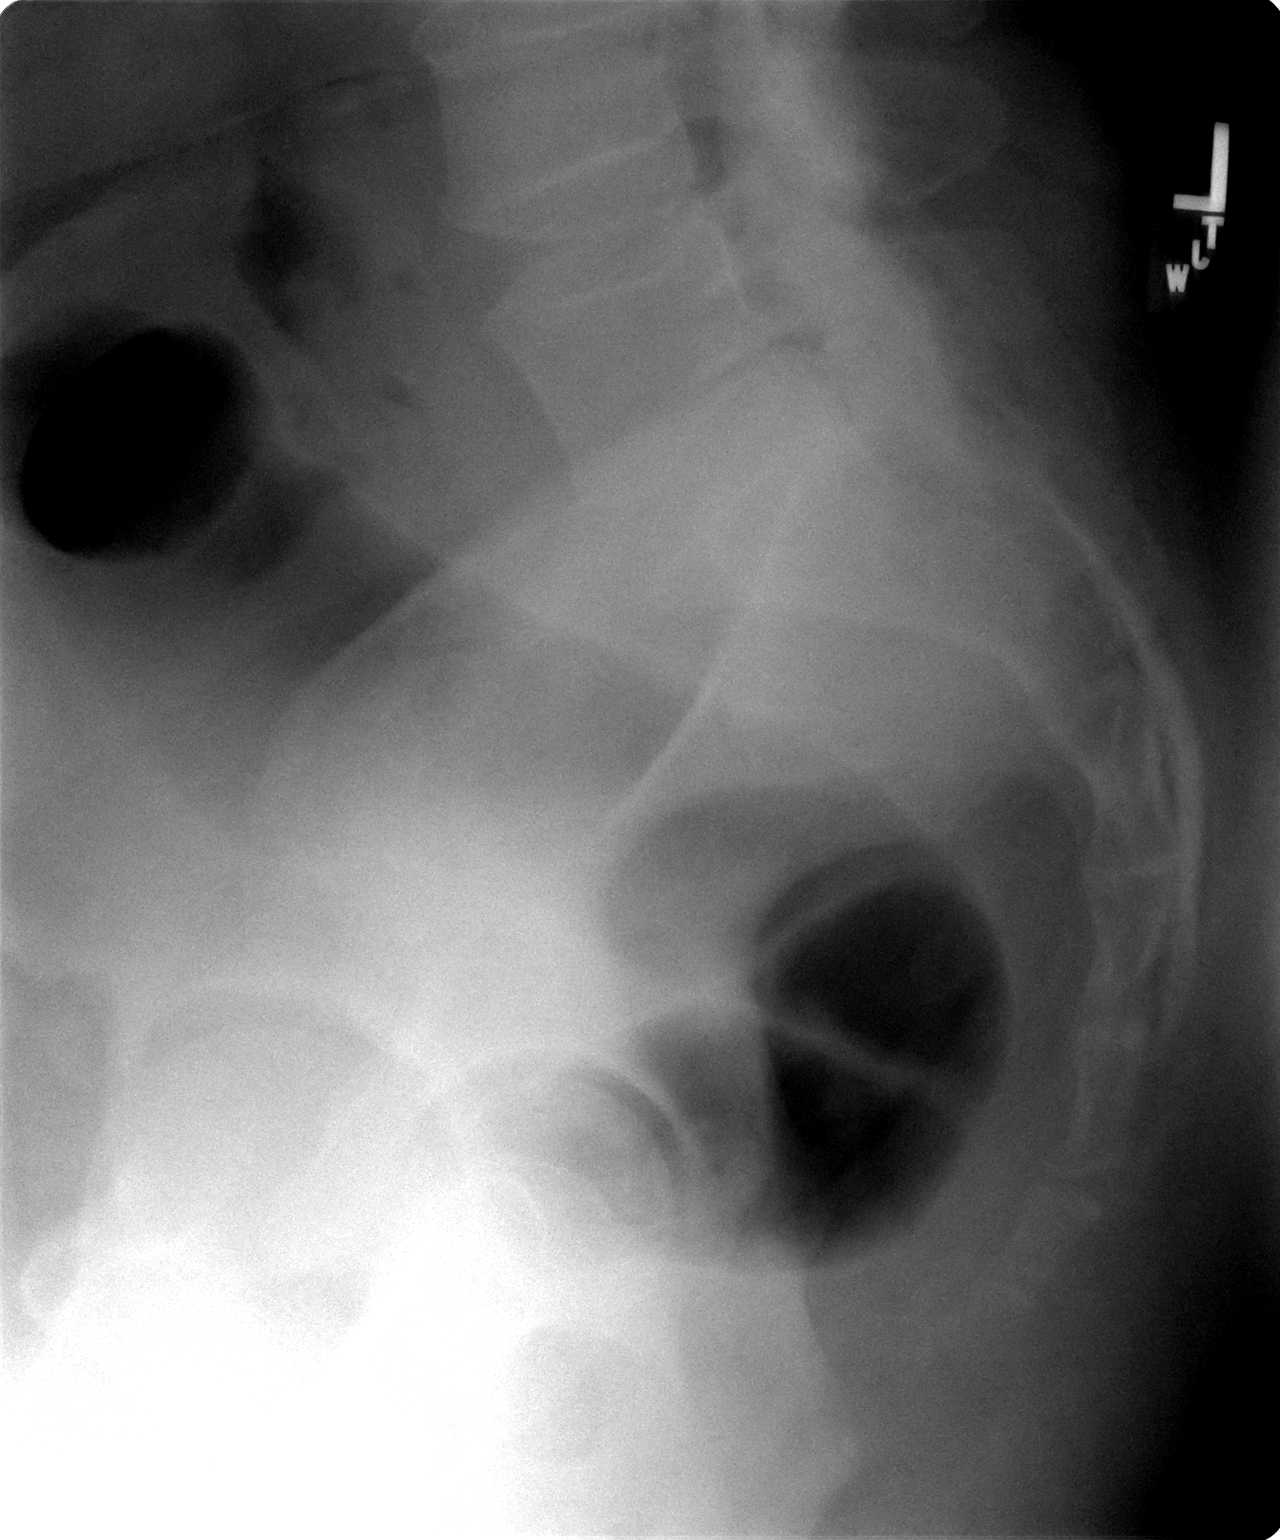

[3 of 3 positions shown; findings below may reference images not displayed]

FINDINGS: There is no evidence of fracture or other focal bone
lesions.
IMPRESSION: Negative.

## 2013-01-18 ENCOUNTER — Ambulatory Visit
Admission: RE | Admit: 2013-01-18 | Discharge: 2013-01-18 | Disposition: A | Discharge status: Home / Self Care | Payer: 59 | Source: Ambulatory Visit | Attending: Family Medicine | Admitting: Family Medicine

## 2013-01-18 DIAGNOSIS — N6310 Unspecified lump in the right breast, unspecified quadrant: Secondary | ICD-10-CM

## 2013-01-20 ENCOUNTER — Encounter: Payer: Self-pay | Admitting: *Deleted

## 2013-02-21 ENCOUNTER — Other Ambulatory Visit: Payer: 59

## 2013-02-23 ENCOUNTER — Ambulatory Visit: Payer: 59 | Admitting: Family Medicine

## 2013-03-15 ENCOUNTER — Ambulatory Visit (INDEPENDENT_AMBULATORY_CARE_PROVIDER_SITE_OTHER): Payer: 59 | Admitting: Family Medicine

## 2013-03-15 ENCOUNTER — Encounter: Payer: Self-pay | Admitting: Family Medicine

## 2013-03-15 VITALS — BP 136/84 | HR 72 | Temp 98.6°F | Wt 281.5 lb

## 2013-03-15 DIAGNOSIS — E119 Type 2 diabetes mellitus without complications: Secondary | ICD-10-CM

## 2013-03-15 DIAGNOSIS — J0100 Acute maxillary sinusitis, unspecified: Secondary | ICD-10-CM | POA: Insufficient documentation

## 2013-03-15 DIAGNOSIS — J019 Acute sinusitis, unspecified: Secondary | ICD-10-CM

## 2013-03-15 MED ORDER — AMOXICILLIN-POT CLAVULANATE 875-125 MG PO TABS: 1.0000 | ORAL_TABLET | Freq: Two times a day (BID) | ORAL | Status: DC

## 2013-03-15 NOTE — Progress Notes (Signed)
She worked up to 4 metformin a day and is tolerating that.  She has lost some weight.  Due for labs and we'll do that today.    duration of symptoms: a few days.  "I just don't feel well." HA, dull, noted the last few days.  Using nasacort in meantime.  Possible low grade fevers.   Rhinorrhea: better with nasacort Congestion: some ear pain: no sore throat: no Cough:some, dry  ROS: See HPI.  Otherwise negative.    Meds, vitals, and allergies reviewed.   GEN: nad, alert and oriented HEENT: mucous membranes moist, TM w/o erythema, nasal epithelium injected, OP with cobblestoning, sinuses ttp x4 NECK: supple w/o LA CV: rrr. PULM: ctab, no inc wob ABD: soft, +bs EXT: no edema

## 2013-03-15 NOTE — Patient Instructions (Addendum)
Go to the lab on the way out.  We'll contact you with your lab report. Start the antibiotics in 1-2 days if not better.  Use a warm compress on your face and keep using the nasal spray.

## 2013-03-15 NOTE — Assessment & Plan Note (Signed)
Continue nasal steroid, hold abx for now, start if sx continue.  She agrees. Supportive care o/w.  Fu prn.  Nontoxic.

## 2013-03-17 ENCOUNTER — Other Ambulatory Visit: Payer: 59

## 2013-03-23 ENCOUNTER — Ambulatory Visit: Payer: 59 | Admitting: Family Medicine

## 2013-03-27 ENCOUNTER — Ambulatory Visit: Payer: 59 | Admitting: Family Medicine

## 2013-04-04 ENCOUNTER — Encounter: Payer: Self-pay | Admitting: Family Medicine

## 2013-04-04 ENCOUNTER — Ambulatory Visit (INDEPENDENT_AMBULATORY_CARE_PROVIDER_SITE_OTHER): Payer: 59 | Admitting: Family Medicine

## 2013-04-04 VITALS — BP 124/90 | HR 80 | Temp 98.2°F | Wt 279.0 lb

## 2013-04-04 DIAGNOSIS — Z23 Encounter for immunization: Secondary | ICD-10-CM

## 2013-04-04 DIAGNOSIS — IMO0002 Reserved for concepts with insufficient information to code with codable children: Secondary | ICD-10-CM

## 2013-04-04 DIAGNOSIS — IMO0001 Reserved for inherently not codable concepts without codable children: Secondary | ICD-10-CM

## 2013-04-04 DIAGNOSIS — E1165 Type 2 diabetes mellitus with hyperglycemia: Secondary | ICD-10-CM

## 2013-04-04 MED ORDER — SITAGLIPTIN PHOSPHATE 100 MG PO TABS: 100.0000 mg | ORAL_TABLET | Freq: Every day | ORAL | Status: DC

## 2013-04-04 NOTE — Progress Notes (Signed)
Recent illness resolved. Feels better.    Diabetes:  Using medications without difficulties: yes Hypoglycemic episodes:no Hyperglycemic episodes:less often now Feet problems:no Blood Sugars averaging:~200 eye exam within last year: <1 year, will be due soon.  A1c improved but not to goal.  Working on weight and diet. Gradual weight loss.    Meds, vitals, and allergies reviewed.   ROS: See HPI.  Otherwise negative.    GEN: nad, alert and oriented, obese HEENT: mucous membranes moist NECK: supple w/o LA CV: rrr. PULM: ctab, no inc wob ABD: soft, +bs EXT: no edema SKIN: no acute rash  Diabetic foot exam: Normal inspection No skin breakdown No calluses  Normal DP pulses Normal sensation to light touch and monofilament Nails normal

## 2013-04-04 NOTE — Patient Instructions (Addendum)
Check on getting an eye exam in early 2015.  Price check the [place] and let me know if it is very expensive.   Schedule a physical for the spring of 2015.   Take care.  Keep working on your [employer] and diet.

## 2013-04-05 NOTE — Assessment & Plan Note (Signed)
Improved, not controlled, d/w pt.  Continue work on diet and exercise, weight.  Will price check the januvia and add on.  She agrees.  Recheck in a few months, call back sooner prn.  She agrees.  She will schedule CPE for 2015.

## 2013-05-23 ENCOUNTER — Other Ambulatory Visit: Payer: Self-pay | Admitting: Family Medicine

## 2013-05-23 MED ORDER — LISINOPRIL 10 MG PO TABS: 10.0000 mg | ORAL_TABLET | Freq: Every day | ORAL | Status: DC

## 2013-06-15 HISTORY — PX: CATARACT EXTRACTION: SUR2

## 2013-06-16 ENCOUNTER — Telehealth: Payer: Self-pay

## 2013-06-16 NOTE — Telephone Encounter (Signed)
Ok to format letter

## 2013-06-16 NOTE — Telephone Encounter (Signed)
Pt left v/m; pt is to start new job on 06/19/13 and HR dept contacted pt that she needs statement that she does not have TB. 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 Left v/m for pt to cb. Pt wants letter today. Please advise.

## 2013-06-16 NOTE — Telephone Encounter (Signed)
Pt left v/m requesting cb. 

## 2013-06-19 NOTE — Telephone Encounter (Signed)
Letter placed in front office for pick ans Left message on voicemail letting pt know it is ready

## 2013-08-11 ENCOUNTER — Other Ambulatory Visit: Payer: Self-pay | Admitting: Family Medicine

## 2013-08-11 DIAGNOSIS — E1165 Type 2 diabetes mellitus with hyperglycemia: Secondary | ICD-10-CM

## 2013-08-11 DIAGNOSIS — IMO0002 Reserved for concepts with insufficient information to code with codable children: Secondary | ICD-10-CM

## 2013-08-14 ENCOUNTER — Other Ambulatory Visit: Payer: 59

## 2013-08-15 ENCOUNTER — Other Ambulatory Visit: Payer: 59

## 2013-08-18 ENCOUNTER — Encounter: Payer: 59 | Admitting: Family Medicine

## 2013-10-10 ENCOUNTER — Other Ambulatory Visit (INDEPENDENT_AMBULATORY_CARE_PROVIDER_SITE_OTHER): Payer: 59

## 2013-10-10 DIAGNOSIS — E1165 Type 2 diabetes mellitus with hyperglycemia: Secondary | ICD-10-CM

## 2013-10-10 DIAGNOSIS — IMO0002 Reserved for concepts with insufficient information to code with codable children: Secondary | ICD-10-CM

## 2013-10-10 DIAGNOSIS — IMO0001 Reserved for inherently not codable concepts without codable children: Secondary | ICD-10-CM

## 2013-10-10 LAB — COMPREHENSIVE METABOLIC PANEL
ALK PHOS: 46 U/L (ref 39–117)
ALT: 36 U/L — AB (ref 0–35)
AST: 38 U/L — ABNORMAL HIGH (ref 0–37)
Albumin: 3.9 g/dL (ref 3.5–5.2)
BILIRUBIN TOTAL: 0.5 mg/dL (ref 0.3–1.2)
BUN: 9 mg/dL (ref 6–23)
CO2: 24 meq/L (ref 19–32)
CREATININE: 0.5 mg/dL (ref 0.4–1.2)
Calcium: 9.3 mg/dL (ref 8.4–10.5)
Chloride: 103 mEq/L (ref 96–112)
GFR: 142.68 mL/min (ref >60.00)
GLUCOSE: 259 mg/dL — AB (ref 70–99)
Potassium: 4.3 mEq/L (ref 3.5–5.1)
SODIUM: 137 meq/L (ref 135–145)
TOTAL PROTEIN: 7.1 g/dL (ref 6.0–8.3)

## 2013-10-10 LAB — LIPID PANEL
CHOLESTEROL: 229 mg/dL — AB (ref 0–200)
HDL: 53.4 mg/dL (ref >39.00)
LDL CALC: 138 mg/dL — AB (ref 0–99)
Total CHOL/HDL Ratio: 4
Triglycerides: 187 mg/dL — ABNORMAL HIGH (ref 0.0–149.0)
VLDL: 37.4 mg/dL (ref 0.0–40.0)

## 2013-10-10 LAB — HEMOGLOBIN A1C: Hgb A1c MFr Bld: 9.5 % — ABNORMAL HIGH (ref 4.6–6.5)

## 2013-10-13 ENCOUNTER — Ambulatory Visit (INDEPENDENT_AMBULATORY_CARE_PROVIDER_SITE_OTHER): Payer: 59 | Admitting: Family Medicine

## 2013-10-13 ENCOUNTER — Encounter: Payer: Self-pay | Admitting: Family Medicine

## 2013-10-13 VITALS — BP 142/92 | HR 96 | Temp 98.0°F | Ht 67.5 in | Wt 278.8 lb

## 2013-10-13 DIAGNOSIS — R109 Unspecified abdominal pain: Secondary | ICD-10-CM | POA: Insufficient documentation

## 2013-10-13 DIAGNOSIS — M25569 Pain in unspecified knee: Secondary | ICD-10-CM

## 2013-10-13 DIAGNOSIS — Z860100 Personal history of colon polyps, unspecified: Secondary | ICD-10-CM | POA: Insufficient documentation

## 2013-10-13 DIAGNOSIS — Z1211 Encounter for screening for malignant neoplasm of colon: Secondary | ICD-10-CM

## 2013-10-13 DIAGNOSIS — Z23 Encounter for immunization: Secondary | ICD-10-CM

## 2013-10-13 DIAGNOSIS — IMO0001 Reserved for inherently not codable concepts without codable children: Secondary | ICD-10-CM

## 2013-10-13 DIAGNOSIS — E1165 Type 2 diabetes mellitus with hyperglycemia: Secondary | ICD-10-CM

## 2013-10-13 DIAGNOSIS — IMO0002 Reserved for concepts with insufficient information to code with codable children: Secondary | ICD-10-CM

## 2013-10-13 HISTORY — DX: Personal history of colon polyps, unspecified: Z86.0100

## 2013-10-13 HISTORY — DX: Personal history of colonic polyps: Z86.010

## 2013-10-13 MED ORDER — GLYBURIDE 5 MG PO TABS: 5.0000 mg | ORAL_TABLET | Freq: Every day | ORAL | Status: DC

## 2013-10-13 NOTE — Assessment & Plan Note (Signed)
Refer to GI 

## 2013-10-13 NOTE — Assessment & Plan Note (Signed)
D/w pt about weight loss.  likely OA.  Can use a knee sleeve, ice as needed.

## 2013-10-13 NOTE — Progress Notes (Signed)
Pre visit review using our clinic review tool, if applicable. No additional management support is needed unless otherwise documented below in the visit note.  Pap and part of CPE deferred given other concerns.   DM2.  Using medications without difficulties: had missed doses with meds, mult doses.  Hadn't been on [place] due to price.   Hypoglycemic episodes:no Hyperglycemic episodes:yes Feet problems:no Blood Sugars averaging: usually 180-200s usually eye exam within last year: due, d/w pt.   A1c is up some, had been off her meds.   Taking metfomin occ, about 2 tabs a day.   Diet and exercise.  Her diet is good, she's working on that.  She is trying to avoid fast food.  She is trying to pack a lunch or get healthy sandwiches. Her weight isn't up.  Some walking for exercise, encouraged more.    Has had f/u with psych.  She was considering a second opinion on med mgmt. She is off pristiq due to finances.  Asking about options.  Mood currently stable.    Possible FH colon cancer.  D/w pt about GI referral.  She agrees.    B knee pain, worse medially B, worse with weather changes.  No trauma.    B abd wall pain, in the lower lateral quadrants.  No trauma.  Positional. No masses noted by patient.   PMH and SH reviewed  Meds, vitals, and allergies reviewed.   ROS: See HPI.  Otherwise negative.    GEN: nad, alert and oriented HEENT: mucous membranes moist NECK: supple w/o LA CV: rrr. PULM: ctab, no inc wob ABD: soft, +bs, obese, laterally ttp at the edges of her pannus. No mass noted.  This pain doesn't appear to originate intra-abdominally.  EXT: no edema SKIN: no acute rash B knee crepitus with medial joint line pain, but ACL MCL LCL feel solid o/w.

## 2013-10-13 NOTE — Patient Instructions (Addendum)
If you want a second opinion with psychiatry, then I would as about seeing Dr. Bridgett Larsson. [place],  [id number]  [phone] (Office)  Rosaria Ferries will call about your referral (GI).  Add on the glyburide and recheck A1c in about 3 months before a visit.  Keep taking the metformin. Take care.

## 2013-10-13 NOTE — Assessment & Plan Note (Signed)
Likely pannus related, no masses noted, no hernia noted.  D/w pt about weight loss.

## 2013-10-13 NOTE — Assessment & Plan Note (Signed)
Add on glyburide and she'll continue to work on diet/weight.  Weight loss is the main issue.  D/w pt about med adherence. Recheck in about 3 months.  Routine cautions given on meds.

## 2013-10-18 ENCOUNTER — Encounter: Payer: Self-pay | Admitting: Internal Medicine

## 2013-10-23 ENCOUNTER — Other Ambulatory Visit: Payer: Self-pay | Admitting: Family Medicine

## 2013-10-26 ENCOUNTER — Ambulatory Visit (INDEPENDENT_AMBULATORY_CARE_PROVIDER_SITE_OTHER): Payer: 59 | Admitting: Family Medicine

## 2013-10-26 ENCOUNTER — Encounter: Payer: Self-pay | Admitting: Family Medicine

## 2013-10-26 VITALS — BP 146/90 | HR 117 | Temp 98.2°F | Wt 277.8 lb

## 2013-10-26 DIAGNOSIS — J069 Acute upper respiratory infection, unspecified: Secondary | ICD-10-CM

## 2013-10-26 DIAGNOSIS — J029 Acute pharyngitis, unspecified: Secondary | ICD-10-CM

## 2013-10-26 LAB — POCT RAPID STREP A (OFFICE): RAPID STREP A SCREEN: NEGATIVE

## 2013-10-26 MED ORDER — BENZONATATE 200 MG PO CAPS: 200.0000 mg | ORAL_CAPSULE | Freq: Three times a day (TID) | ORAL | Status: DC | PRN

## 2013-10-26 NOTE — Patient Instructions (Signed)
Drink plenty of fluids, take tylenol as needed, and gargle with warm salt water for your throat.  This should gradually improve.  Take care.  Let us know if you have other concerns.   Use tessalon for the cough.

## 2013-10-26 NOTE — Progress Notes (Signed)
Pre visit review using our clinic review tool, if applicable. No additional management support is needed unless otherwise documented below in the visit note.  Sugars were better with glyburide until she got she got sick recently.    URI sx started about 6 days ago.  Started with ST, scratchy throat.  Son has been sick at home. Coughing, more sputum prev than now- drier today.  Hoarse voice.  Stuffy, rhinorrhea.  Eye watering.  No ear pain.  Had a fever prev.  Myalgias.  No vomiting, no diarrhea.  Some nausea. No rash.    Meds, vitals, and allergies reviewed.   ROS: See HPI.  Otherwise, noncontributory.  GEN: nad, alert and oriented HEENT: mucous membranes moist, tm w/o erythema, nasal exam w/o erythema, clear discharge noted,  OP with cobblestoning NECK: supple w/o LA CV: rrr.   PULM: ctab, no inc wob EXT: no edema SKIN: no acute rash  RST neg.

## 2013-10-26 NOTE — Assessment & Plan Note (Signed)
Nontoxic, likely viral.  Use tessalon for cough, f/u prn. D/w pt. She agrees. Okay for outpatient f/u.

## 2013-11-21 ENCOUNTER — Ambulatory Visit (AMBULATORY_SURGERY_CENTER): Payer: 59 | Admitting: *Deleted

## 2013-11-21 VITALS — Ht 67.5 in | Wt 281.8 lb

## 2013-11-21 DIAGNOSIS — Z1211 Encounter for screening for malignant neoplasm of colon: Secondary | ICD-10-CM

## 2013-11-21 MED ORDER — NA SULFATE-K SULFATE-MG SULF 17.5-3.13-1.6 GM/177ML PO SOLN
1.0000 | Freq: Once | ORAL | Status: DC
Start: 2013-11-21 — End: 2013-12-12

## 2013-11-21 NOTE — Progress Notes (Signed)
No allergies to eggs or soy. No problems with anesthesia.  Pt given Emmi instructions for colonoscopy  No oxygen use  No diet drug use  

## 2013-12-06 ENCOUNTER — Encounter: Payer: Self-pay | Admitting: Internal Medicine

## 2013-12-12 ENCOUNTER — Encounter: Payer: Self-pay | Admitting: Internal Medicine

## 2013-12-12 ENCOUNTER — Ambulatory Visit (AMBULATORY_SURGERY_CENTER): Payer: 59 | Admitting: Internal Medicine

## 2013-12-12 VITALS — BP 106/62 | HR 110 | Temp 97.8°F | Resp 15 | Ht 67.0 in | Wt 281.0 lb

## 2013-12-12 DIAGNOSIS — D126 Benign neoplasm of colon, unspecified: Secondary | ICD-10-CM

## 2013-12-12 DIAGNOSIS — Z1211 Encounter for screening for malignant neoplasm of colon: Secondary | ICD-10-CM

## 2013-12-12 DIAGNOSIS — D124 Benign neoplasm of descending colon: Secondary | ICD-10-CM

## 2013-12-12 MED ORDER — SODIUM CHLORIDE 0.9 % IV SOLN: 500.0000 mL | INTRAVENOUS | Status: DC

## 2013-12-12 NOTE — Progress Notes (Signed)
A/ox3 pleased with MAC, report to Karen RN 

## 2013-12-12 NOTE — Op Note (Signed)
[place]  [address] [place], [address]   COLONOSCOPY PROCEDURE REPORT  PATIENT: Sarah Walls, Sarah Walls  MR#: [id number] BIRTHDATE: Aug 07, 1960 , 15  yrs. old GENDER: Female ENDOSCOPIST: Gatha Mayer, MD, [facility] REFERRED CV:UDTHYH Damita Dunnings, M.D. PROCEDURE DATE:  12/12/2013 PROCEDURE:   Colonoscopy with snare polypectomy First Screening Colonoscopy - Avg.  risk and is 50 yrs.  old or older Yes.          Polyps Removed Today? Yes. ASA CLASS:   Class III INDICATIONS:average risk screening and first colonoscopy. MEDICATIONS: propofol (Diprivan) 350mg  IV, MAC sedation, administered by CRNA, and These medications were titrated to patient response per physician's verbal order  DESCRIPTION OF PROCEDURE:   After the risks benefits and alternatives of the procedure were thoroughly explained, informed consent was obtained.  A digital rectal exam revealed no abnormalities of the rectum.   The LB [id number]  endoscope was introduced through the anus and advanced to the cecum, which was identified by both the appendix and ileocecal valve. No adverse events experienced.   The quality of the prep was Suprep adequate The instrument was then slowly withdrawn as the colon was fully examined.      COLON FINDINGS: Six sessile polyps measuring 5-12 mm in size were found in the ascending colon, at the hepatic flexure, and splenic flexure.  A polypectomy was performed with a cold snare and using snare cautery.  The resection was complete and the polyp tissue was completely retrieved.   The colon mucosa was otherwise normal. Retroflexed views revealed no abnormalities. The time to cecum=1 minutes 10 seconds.  Withdrawal time=15 minutes 12 seconds.  The scope was withdrawn and the procedure completed. COMPLICATIONS: There were no complications.  ENDOSCOPIC IMPRESSION: 1.   Six sessile polyps measuring 5-12 mm in size were found in the ascending colon, at the hepatic flexure, and  splenic flexure; polypectomy was performed with a cold snare and using snare cautery  2.   The colon mucosa was otherwise normal - adequate prep - first screening  RECOMMENDATIONS: 1.  Hold aspirin, aspirin products, and anti-inflammatory medication for 2 weeks. 2.  Timing of repeat colonoscopy will be determined by pathology findings.   eSigned:  Gatha Mayer, MD, [facility] 12/12/2013 10:27 AM   cc: Elsie Stain, MD and The Patient   PATIENT NAME:  Sarah Walls, Sarah Walls MR#: [id number]

## 2013-12-12 NOTE — Patient Instructions (Addendum)
I found and removed 6 polyps today. They all look benign.  I will let you know pathology results and when to have another routine colonoscopy by mail. It will probably be 3 years.  I appreciate the opportunity to care for you. Gatha Mayer, MD, FACG  NO ASPIRIN, ASPIRIN PRODUCTS OR NSAIDS,IBUPROFEN, ALEVE, ADVIL ETC FOR 2 WEEKS, July 14,2015.   YOU HAD AN ENDOSCOPIC PROCEDURE TODAY AT [facility]: Refer to the procedure report that was given to you for any specific questions about what was found during the examination.  If the procedure report does not answer your questions, please call your gastroenterologist to clarify.  If you requested that your care partner not be given the details of your procedure findings, then the procedure report has been included in a sealed envelope for you to review at your convenience later.  YOU SHOULD EXPECT: Some feelings of bloating in the abdomen. Passage of more gas than usual.  Walking can help get rid of the air that was put into your GI tract during the procedure and reduce the bloating. If you had a lower endoscopy (such as a colonoscopy or flexible sigmoidoscopy) you may notice spotting of blood in your stool or on the toilet paper. If you underwent a bowel prep for your procedure, then you may not have a normal bowel movement for a few days.  DIET: Your first meal following the procedure should be a light meal and then it is ok to progress to your normal diet.  A half-sandwich or bowl of soup is an example of a good first meal.  Heavy or fried foods are harder to digest and may make you feel nauseous or bloated.  Likewise meals heavy in dairy and vegetables can cause extra gas to form and this can also increase the bloating.  Drink plenty of fluids but you should avoid alcoholic beverages for 24 hours.  ACTIVITY: Your care partner should take you home directly after the procedure.  You should plan to take it easy, moving slowly for the  rest of the day.  You can resume normal activity the day after the procedure however you should NOT DRIVE or use heavy machinery for 24 hours (because of the sedation medicines used during the test).    SYMPTOMS TO REPORT IMMEDIATELY: A gastroenterologist can be reached at any hour.  During normal business hours, 8:30 AM to 5:00 PM Monday through Friday, call [phone].  After hours and on weekends, please call the GI answering service at [phone] who will take a message and have the physician on call contact you.   Following lower endoscopy (colonoscopy or flexible sigmoidoscopy):  Excessive amounts of blood in the stool  Significant tenderness or worsening of abdominal pains  Swelling of the abdomen that is new, acute  Fever of 100F or higher FOLLOW UP: If any biopsies were taken you will be contacted by phone or by letter within the next 1-3 weeks.  Call your gastroenterologist if you have not heard about the biopsies in 3 weeks.  Our staff will call the home number listed on your records the next business day following your procedure to check on you and address any questions or concerns that you may have at that time regarding the information given to you following your procedure. This is a courtesy call and so if there is no answer at the home number and we have not heard from you through the emergency physician on call,  we will assume that you have returned to your regular daily activities without incident.  SIGNATURES/CONFIDENTIALITY: You and/or your care partner have signed paperwork which will be entered into your electronic medical record.  These signatures attest to the fact that that the information above on your After Visit Summary has been reviewed and is understood.  Full responsibility of the confidentiality of this discharge information lies with you and/or your care-partner.

## 2013-12-12 NOTE — Progress Notes (Signed)
Called to room to assist during endoscopic procedure.  Patient ID and intended procedure confirmed with present staff. Received instructions for my participation in the procedure from the performing physician.  

## 2013-12-13 ENCOUNTER — Telehealth: Payer: Self-pay | Admitting: *Deleted

## 2013-12-13 NOTE — Telephone Encounter (Signed)
No answer, left message to call if questions or concerns. 

## 2013-12-21 ENCOUNTER — Encounter: Payer: Self-pay | Admitting: Internal Medicine

## 2013-12-21 NOTE — Progress Notes (Signed)
Quick Note:  6 adenomas max 12 mm Repeat colon 2018 ______

## 2014-01-08 ENCOUNTER — Other Ambulatory Visit: Payer: Self-pay | Admitting: Family Medicine

## 2014-01-08 DIAGNOSIS — E1165 Type 2 diabetes mellitus with hyperglycemia: Secondary | ICD-10-CM

## 2014-01-08 DIAGNOSIS — IMO0002 Reserved for concepts with insufficient information to code with codable children: Secondary | ICD-10-CM

## 2014-01-12 ENCOUNTER — Other Ambulatory Visit: Payer: 59

## 2014-01-15 ENCOUNTER — Ambulatory Visit: Payer: 59 | Admitting: Family Medicine

## 2014-02-06 ENCOUNTER — Other Ambulatory Visit: Payer: 59

## 2014-02-07 ENCOUNTER — Other Ambulatory Visit (INDEPENDENT_AMBULATORY_CARE_PROVIDER_SITE_OTHER): Payer: 59

## 2014-02-07 DIAGNOSIS — E1165 Type 2 diabetes mellitus with hyperglycemia: Secondary | ICD-10-CM

## 2014-02-07 DIAGNOSIS — IMO0001 Reserved for inherently not codable concepts without codable children: Secondary | ICD-10-CM

## 2014-02-07 DIAGNOSIS — IMO0002 Reserved for concepts with insufficient information to code with codable children: Secondary | ICD-10-CM

## 2014-02-07 LAB — HEMOGLOBIN A1C: HEMOGLOBIN A1C: 10.2 % — AB (ref 4.6–6.5)

## 2014-02-07 LAB — LIPID PANEL
CHOL/HDL RATIO: 4
Cholesterol: 206 mg/dL — ABNORMAL HIGH (ref 0–200)
HDL: 48.9 mg/dL (ref >39.00)
LDL CALC: 120 mg/dL — AB (ref 0–99)
NONHDL: 157.1
Triglycerides: 188 mg/dL — ABNORMAL HIGH (ref 0.0–149.0)
VLDL: 37.6 mg/dL (ref 0.0–40.0)

## 2014-02-13 ENCOUNTER — Encounter: Payer: Self-pay | Admitting: Family Medicine

## 2014-02-13 ENCOUNTER — Ambulatory Visit (INDEPENDENT_AMBULATORY_CARE_PROVIDER_SITE_OTHER): Payer: 59 | Admitting: Family Medicine

## 2014-02-13 VITALS — BP 126/90 | HR 88 | Temp 97.6°F | Wt 288.5 lb

## 2014-02-13 DIAGNOSIS — IMO0002 Reserved for concepts with insufficient information to code with codable children: Secondary | ICD-10-CM

## 2014-02-13 DIAGNOSIS — E1165 Type 2 diabetes mellitus with hyperglycemia: Secondary | ICD-10-CM

## 2014-02-13 DIAGNOSIS — IMO0001 Reserved for inherently not codable concepts without codable children: Secondary | ICD-10-CM

## 2014-02-13 MED ORDER — INSULIN GLARGINE 100 UNIT/ML SOLOSTAR PEN: PEN_INJECTOR | SUBCUTANEOUS | Status: DC

## 2014-02-13 MED ORDER — INSULIN PEN NEEDLE 31G X 5 MM MISC: Status: DC

## 2014-02-13 NOTE — Patient Instructions (Signed)
Get the pens and needles and then schedule a visit here with Damita Dunnings for insulin start.  Take care.  We'll go from there.

## 2014-02-13 NOTE — Progress Notes (Signed)
Pre visit review using our clinic review tool, if applicable. No additional management support is needed unless otherwise documented below in the visit note.  Her job situation is in upheaval and she is trying to deal with that.  She has trouble with taking her meds, needing to wait to eat to take her meds and that affects her compliance.  A1c up, d/w pt.  Had checked sugar, had been high, rarely >300 but usually above 200.  She can tolerate glyburide but had been off the metformin recently.  Still on statin, tolerating that.  She is eating breakfast.     Meds, vitals, and allergies reviewed.   ROS: See HPI.  Otherwise, noncontributory.  nad ncat Mmm rrr Ctab Abd soft, not ttp obese

## 2014-02-14 NOTE — Assessment & Plan Note (Signed)
D/w pt.  Would start insulin.  D/w pt about pen basics today, self injection.  She understood about dose titration.  Needs work on diet and weight, discussed.  She'll get the pens, then come back for hands on instruction.  She agrees. >25 minutes spent in face to face time with patient, >50% spent in counselling or coordination of care.

## 2014-02-23 ENCOUNTER — Encounter: Payer: Self-pay | Admitting: Family Medicine

## 2014-02-23 ENCOUNTER — Ambulatory Visit (INDEPENDENT_AMBULATORY_CARE_PROVIDER_SITE_OTHER): Payer: 59 | Admitting: Family Medicine

## 2014-02-23 VITALS — BP 136/90 | HR 130 | Temp 98.5°F

## 2014-02-23 DIAGNOSIS — IMO0002 Reserved for concepts with insufficient information to code with codable children: Secondary | ICD-10-CM

## 2014-02-23 DIAGNOSIS — IMO0001 Reserved for inherently not codable concepts without codable children: Secondary | ICD-10-CM

## 2014-02-23 DIAGNOSIS — E1165 Type 2 diabetes mellitus with hyperglycemia: Secondary | ICD-10-CM

## 2014-02-23 NOTE — Progress Notes (Signed)
Pre visit review using our clinic review tool, if applicable. No additional management support is needed unless otherwise documented below in the visit note.  DM2 f/u, insulin instructions.  Has pens, needles to use.  Has a new job pending.  That will likely improve her diet and schedule.  D/w  Pt about both for DM2 control, along with last A1c.  Hadn't been able to tolerate metformin recently due to GI sx/intolerance, but not a true allergy.   Meds, vitals, and allergies reviewed.   ROS: See HPI.  Otherwise, noncontributory.  nad Remainder of exam deferred as time spend counseling face to face.

## 2014-02-23 NOTE — Patient Instructions (Signed)
5 units at night initially. If your AM sugar is >150, then add 1 unit daily.  If 100-150, then give the same dose as the day before.  If <100, then subtract 1 unit from the prev dose.   This is assuming you are working daytime shift. You'll check your sugar after sleeping otherwise and give your insulin after the work shift.   Update me in about 1 week with your sugar and dose.   Take care.   (Check sugar after sleeping and give insulin after work)

## 2014-02-25 ENCOUNTER — Encounter: Payer: Self-pay | Admitting: Family Medicine

## 2014-02-25 NOTE — Assessment & Plan Note (Signed)
>  15 minutes spent in face to face time with patient, >50% spent in counselling or coordination of care.  Specifics of pen use d/w pt, along with dosing, see instructions.  Written instructions given for process.  She gave her first dose, no complications, aseptic technique.  She did well, no complaints.  Will up-titrate dose by 1 unit per day until AM sugar less than 150.  She'll update [place].  See instructions.

## 2014-03-01 ENCOUNTER — Telehealth: Payer: Self-pay

## 2014-03-01 NOTE — Telephone Encounter (Signed)
Pt request copy of immunizations at front desk for new employment. Advised pt done and at front desk for pick up.

## 2014-07-12 ENCOUNTER — Other Ambulatory Visit: Payer: Self-pay | Admitting: *Deleted

## 2014-07-12 MED ORDER — LISINOPRIL 10 MG PO TABS: 10.0000 mg | ORAL_TABLET | Freq: Every day | ORAL | Status: DC

## 2014-09-28 ENCOUNTER — Other Ambulatory Visit: Payer: Self-pay

## 2014-09-28 MED ORDER — SIMVASTATIN 40 MG PO TABS: 40.0000 mg | ORAL_TABLET | Freq: Every day | ORAL | Status: DC

## 2014-09-28 NOTE — Telephone Encounter (Signed)
[place] left v/m requesting refill simvastatin. Done with note pt needs to call for appt.

## 2014-10-30 ENCOUNTER — Emergency Department (HOSPITAL_COMMUNITY)
Admission: EM | Admit: 2014-10-30 | Discharge: 2014-10-30 | Disposition: A | Discharge status: Home / Self Care | Payer: 59 | Attending: Emergency Medicine | Admitting: Emergency Medicine

## 2014-10-30 ENCOUNTER — Encounter (HOSPITAL_COMMUNITY): Payer: Self-pay

## 2014-10-30 DIAGNOSIS — E860 Dehydration: Secondary | ICD-10-CM | POA: Insufficient documentation

## 2014-10-30 DIAGNOSIS — M1990 Unspecified osteoarthritis, unspecified site: Secondary | ICD-10-CM | POA: Diagnosis not present

## 2014-10-30 DIAGNOSIS — Z79899 Other long term (current) drug therapy: Secondary | ICD-10-CM | POA: Insufficient documentation

## 2014-10-30 DIAGNOSIS — I10 Essential (primary) hypertension: Secondary | ICD-10-CM | POA: Insufficient documentation

## 2014-10-30 DIAGNOSIS — E785 Hyperlipidemia, unspecified: Secondary | ICD-10-CM | POA: Diagnosis not present

## 2014-10-30 DIAGNOSIS — F319 Bipolar disorder, unspecified: Secondary | ICD-10-CM | POA: Insufficient documentation

## 2014-10-30 DIAGNOSIS — R42 Dizziness and giddiness: Secondary | ICD-10-CM | POA: Diagnosis present

## 2014-10-30 DIAGNOSIS — Z8719 Personal history of other diseases of the digestive system: Secondary | ICD-10-CM | POA: Diagnosis not present

## 2014-10-30 DIAGNOSIS — F410 Panic disorder [episodic paroxysmal anxiety] without agoraphobia: Secondary | ICD-10-CM | POA: Insufficient documentation

## 2014-10-30 DIAGNOSIS — Z794 Long term (current) use of insulin: Secondary | ICD-10-CM | POA: Insufficient documentation

## 2014-10-30 DIAGNOSIS — R112 Nausea with vomiting, unspecified: Secondary | ICD-10-CM | POA: Insufficient documentation

## 2014-10-30 DIAGNOSIS — Z7982 Long term (current) use of aspirin: Secondary | ICD-10-CM | POA: Diagnosis not present

## 2014-10-30 DIAGNOSIS — E119 Type 2 diabetes mellitus without complications: Secondary | ICD-10-CM | POA: Insufficient documentation

## 2014-10-30 LAB — CBC WITH DIFFERENTIAL/PLATELET
Basophils Absolute: 0 10*3/uL (ref 0.0–0.1)
Basophils Relative: 0 % (ref 0–1)
Eosinophils Absolute: 0.2 10*3/uL (ref 0.0–0.7)
Eosinophils Relative: 2 % (ref 0–5)
HCT: 45.3 % (ref 36.0–46.0)
HEMOGLOBIN: 14.7 g/dL (ref 12.0–15.0)
Lymphocytes Relative: 35 % (ref 12–46)
Lymphs Abs: 3.2 10*3/uL (ref 0.7–4.0)
MCH: 30 pg (ref 26.0–34.0)
MCHC: 32.5 g/dL (ref 30.0–36.0)
MCV: 92.4 fL (ref 78.0–100.0)
MONOS PCT: 6 % (ref 3–12)
Monocytes Absolute: 0.6 10*3/uL (ref 0.1–1.0)
NEUTROS PCT: 57 % (ref 43–77)
Neutro Abs: 5.1 10*3/uL (ref 1.7–7.7)
Platelets: 255 10*3/uL (ref 150–400)
RBC: 4.9 MIL/uL (ref 3.87–5.11)
RDW: 13 % (ref 11.5–15.5)
WBC: 9.1 10*3/uL (ref 4.0–10.5)

## 2014-10-30 LAB — COMPREHENSIVE METABOLIC PANEL
ALBUMIN: 4 g/dL (ref 3.5–5.0)
ALT: 33 U/L (ref 14–54)
AST: 26 U/L (ref 15–41)
Alkaline Phosphatase: 64 U/L (ref 38–126)
Anion gap: 10 (ref 5–15)
BILIRUBIN TOTAL: 0.5 mg/dL (ref 0.3–1.2)
BUN: 14 mg/dL (ref 6–20)
CHLORIDE: 105 mmol/L (ref 101–111)
CO2: 24 mmol/L (ref 22–32)
Calcium: 9.4 mg/dL (ref 8.9–10.3)
Creatinine, Ser: 0.36 mg/dL — ABNORMAL LOW (ref 0.44–1.00)
GFR calc Af Amer: >60 mL/min (ref >60)
GFR calc non Af Amer: >60 mL/min (ref >60)
Glucose, Bld: 247 mg/dL — ABNORMAL HIGH (ref 65–99)
Potassium: 4.2 mmol/L (ref 3.5–5.1)
Sodium: 139 mmol/L (ref 135–145)
Total Protein: 7.6 g/dL (ref 6.5–8.1)

## 2014-10-30 LAB — URINALYSIS, ROUTINE W REFLEX MICROSCOPIC
Bilirubin Urine: NEGATIVE
GLUCOSE, UA: 500 mg/dL — AB
HGB URINE DIPSTICK: NEGATIVE
KETONES UR: 15 mg/dL — AB
Leukocytes, UA: NEGATIVE
Nitrite: NEGATIVE
Protein, ur: 30 mg/dL — AB
Specific Gravity, Urine: 1.031 — ABNORMAL HIGH (ref 1.005–1.030)
Urobilinogen, UA: 0.2 mg/dL (ref 0.0–1.0)
pH: 6 (ref 5.0–8.0)

## 2014-10-30 LAB — CBG MONITORING, ED: Glucose-Capillary: 204 mg/dL — ABNORMAL HIGH (ref 65–99)

## 2014-10-30 LAB — I-STAT TROPONIN, ED: TROPONIN I, POC: 0 ng/mL (ref 0.00–0.08)

## 2014-10-30 LAB — URINE MICROSCOPIC-ADD ON: Urine-Other: NONE SEEN

## 2014-10-30 LAB — LIPASE, BLOOD: Lipase: 18 U/L — ABNORMAL LOW (ref 22–51)

## 2014-10-30 MED ORDER — SODIUM CHLORIDE 0.9 % IV BOLUS (SEPSIS)
1000.0000 mL | Freq: Once | INTRAVENOUS | Status: AC
  Administered 2014-10-30: 1000 mL via INTRAVENOUS

## 2014-10-30 NOTE — ED Notes (Signed)
Nurse drawing labs. 

## 2014-10-30 NOTE — ED Notes (Signed)
EKG given to EDP [facility] for review

## 2014-10-30 NOTE — ED Notes (Signed)
Bed: WA09 Expected date:  Expected time:  Means of arrival:  Comments: EMS Diabetic/dizzy

## 2014-10-30 NOTE — ED Notes (Signed)
Patient arrives by EMS, working at [employer] and developed dizziness, nausea and vomiting. CBG 201, #18 angio left ACF, Zofran 4 mg IV

## 2014-10-30 NOTE — ED Provider Notes (Signed)
CSN: [id number]     Arrival date & time 10/30/14  0000 History   First MD Initiated Contact with Patient 10/30/14 0154     Chief Complaint  Patient presents with  . Blood Sugar Problem  . Dizziness  . Nausea  . Emesis     (Consider location/radiation/quality/duration/timing/severity/associated sxs/prior Treatment) Patient is a 54 y.o. female presenting with dizziness and vomiting. The history is provided by the patient. No language interpreter was used.  Dizziness Quality:  Lightheadedness Severity:  Moderate Onset quality:  Gradual Timing:  Constant Associated symptoms: nausea and vomiting   Associated symptoms: no chest pain, no headaches, no palpitations, no shortness of breath and no weakness   Associated symptoms comment:  She comes in for evaluation of lightheadedness, "like the lights are going to go out", that started earlier this evening around 7:00 pm. Symptoms became worse over time prompting emergency evaluation. She denies syncope, chest pain, headache, palpitations. She had an isolated period of nausea with one episode of vomiting but no further nausea since. She denies recent illness or fever.  Emesis Associated symptoms: no abdominal pain, no chills, no headaches and no myalgias     Past Medical History  Diagnosis Date  . Depression     BAD2 - Dr. Albertine Patricia  . Panic attacks     BAD2 - Dr. Albertine Patricia  . Hypertension   . Diabetes mellitus   . Hyperlipidemia   . Bipolar disorder   . Arthritis   . Personal history of colonic polyps - adenomas 10/13/2013    12/2013 - 6 adenomas max 12 mm - repeat colonoscopy  2018    Past Surgical History  Procedure Laterality Date  . Tonsillectomy  1969  . Cesarean section  1990    Fetal Distress   Family History  Problem Relation Age of Onset  . Cancer Mother     Metastatic liver CA  . Thyroid disease Mother     Hypothyroidism  . Vision loss Mother     Eye tumor, removed orbit at the end of 2004  . Melanoma Mother   .  Diabetes Father   . Heart disease Father     CABG, coronary disease  . Thyroid disease Sister     Hypothyroidism  . Breast cancer Neg Hx   . Colon cancer Maternal Aunt 70   History  Substance Use Topics  . Smoking status: Never Smoker   . Smokeless tobacco: Never Used  . Alcohol Use: No   OB History    No data available     Review of Systems  Constitutional: Negative for fever and chills.  HENT: Negative.   Respiratory: Negative.  Negative for shortness of breath.   Cardiovascular: Negative.  Negative for chest pain and palpitations.  Gastrointestinal: Positive for nausea and vomiting. Negative for abdominal pain.  Genitourinary: Negative.  Negative for dysuria and frequency.  Musculoskeletal: Negative.  Negative for myalgias, neck pain and neck stiffness.  Skin: Negative.   Neurological: Positive for dizziness. Negative for syncope, speech difficulty, weakness and headaches.      Allergies  Review of patient's allergies indicates no known allergies.  Home Medications   Prior to Admission medications   Medication Sig Start Date End Date Taking? Authorizing Provider  ALPRAZolam Duanne Moron) 0.5 MG tablet Take 0.5 mg by mouth at bedtime as needed (for panic attacks.). May take up to 4 tablets per day as needed   Yes Historical Provider, MD  aspirin 81 MG tablet Take 81 mg by  mouth daily.   Yes Historical Provider, MD  Blood Glucose Monitoring Suppl (ACCU-CHEK AVIVA) kit Use to check blood sugar one or two times daily.    Yes Historical Provider, MD  glucose blood (ACCU-CHEK AVIVA) test strip Check blood sugar one or two times daily as directed. 09/28/11  Yes Tonia Ghent, MD  glyBURIDE (DIABETA) 5 MG tablet Take 1 tablet (5 mg total) by mouth daily with breakfast. 10/13/13  Yes Tonia Ghent, MD  Insulin Glargine (LANTUS SOLOSTAR) 100 UNIT/ML Solostar Pen Start with 5 units at night.  If AM sugar >150, then add 1 unit daily. Patient taking differently: Inject 30 Units into the  skin daily at 10 pm. Start with 5 units at night.  If AM sugar >150, then add 1 unit daily. 02/13/14  Yes Tonia Ghent, MD  Insulin Pen Needle 31G X 5 MM MISC Use daily with insulin pen 02/13/14  Yes Tonia Ghent, MD  lamoTRIgine (LAMICTAL) 150 MG tablet Take 150 mg by mouth daily.   Yes Historical Provider, MD  lisinopril (PRINIVIL,ZESTRIL) 10 MG tablet Take 1 tablet (10 mg total) by mouth daily. 07/12/14  Yes Tonia Ghent, MD  metoprolol tartrate (LOPRESSOR) 25 MG tablet Take 1 tablet (25 mg total) by mouth 2 (two) times daily. 07/06/12  Yes Tonia Ghent, MD  simvastatin (ZOCOR) 40 MG tablet Take 1 tablet (40 mg total) by mouth at bedtime. 09/28/14  Yes Tonia Ghent, MD  Multiple Vitamin (MULTIVITAMIN) tablet Take 1 tablet by mouth daily.      Historical Provider, MD   BP 156/78 mmHg  Pulse 107  Temp(Src) 98 F (36.7 C) (Oral)  Resp 18  Ht _0  (1.727 m)  Wt 280 lb (127.007 kg)  BMI 42.58 kg/m2  SpO2 97%  LMP 01/18/2012 Physical Exam  Constitutional: She is oriented to person, place, and time. She appears well-developed and well-nourished.  HENT:  Head: Normocephalic.  Eyes: Conjunctivae are normal. Pupils are equal, round, and reactive to light.  Neck: Normal range of motion. Neck supple.  Cardiovascular: Normal rate and regular rhythm.   Pulmonary/Chest: Effort normal and breath sounds normal.  Abdominal: Soft. Bowel sounds are normal. There is no tenderness. There is no rebound and no guarding.  Musculoskeletal: Normal range of motion.  Neurological: She is alert and oriented to person, place, and time. She has normal strength and normal reflexes. No sensory deficit. She displays a negative Romberg sign. Coordination normal.  CN's 3-12 grossly intact. No coordination abnormalities on heel-to-shin or finger-to-nose. No lateralizing weakness. Normal sensation to light touch without discrepancies. Speech is clear and focused.  Skin: Skin is warm and dry. No rash noted.   Psychiatric: She has a normal mood and affect.    ED Course  Procedures (including critical care time) Labs Review Labs Reviewed  COMPREHENSIVE METABOLIC PANEL - Abnormal; Notable for the following:    Glucose, Bld 247 (*)    Creatinine, Ser 0.36 (*)    All other components within normal limits  LIPASE, BLOOD - Abnormal; Notable for the following:    Lipase 18 (*)    All other components within normal limits  CBG MONITORING, ED - Abnormal; Notable for the following:    Glucose-Capillary 204 (*)    All other components within normal limits  CBC WITH DIFFERENTIAL/PLATELET  URINALYSIS, ROUTINE W REFLEX MICROSCOPIC  I-STAT TROPOININ, ED   Results for orders placed or performed during the hospital encounter of 10/30/14  CBC with  Differential  Result Value Ref Range   WBC 9.1 4.0 - 10.5 K/uL   RBC 4.90 3.87 - 5.11 MIL/uL   Hemoglobin 14.7 12.0 - 15.0 g/dL   HCT 45.3 36.0 - 46.0 %   MCV 92.4 78.0 - 100.0 fL   MCH 30.0 26.0 - 34.0 pg   MCHC 32.5 30.0 - 36.0 g/dL   RDW 13.0 11.5 - 15.5 %   Platelets 255 150 - 400 K/uL   Neutrophils Relative % 57 43 - 77 %   Neutro Abs 5.1 1.7 - 7.7 K/uL   Lymphocytes Relative 35 12 - 46 %   Lymphs Abs 3.2 0.7 - 4.0 K/uL   Monocytes Relative 6 3 - 12 %   Monocytes Absolute 0.6 0.1 - 1.0 K/uL   Eosinophils Relative 2 0 - 5 %   Eosinophils Absolute 0.2 0.0 - 0.7 K/uL   Basophils Relative 0 0 - 1 %   Basophils Absolute 0.0 0.0 - 0.1 K/uL  Comprehensive metabolic panel  Result Value Ref Range   Sodium 139 135 - 145 mmol/L   Potassium 4.2 3.5 - 5.1 mmol/L   Chloride 105 101 - 111 mmol/L   CO2 24 22 - 32 mmol/L   Glucose, Bld 247 (H) 65 - 99 mg/dL   BUN 14 6 - 20 mg/dL   Creatinine, Ser 0.36 (L) 0.44 - 1.00 mg/dL   Calcium 9.4 8.9 - 10.3 mg/dL   Total Protein 7.6 6.5 - 8.1 g/dL   Albumin 4.0 3.5 - 5.0 g/dL   AST 26 15 - 41 U/L   ALT 33 14 - 54 U/L   Alkaline Phosphatase 64 38 - 126 U/L   Total Bilirubin 0.5 0.3 - 1.2 mg/dL   GFR calc non  Af Amer >60 >60 mL/min   GFR calc Af Amer >60 >60 mL/min   Anion gap 10 5 - 15  Lipase, blood  Result Value Ref Range   Lipase 18 (L) 22 - 51 U/L  Urinalysis, Routine w reflex microscopic  Result Value Ref Range   Color, Urine YELLOW YELLOW   APPearance CLEAR CLEAR   Specific Gravity, Urine 1.031 (H) 1.005 - 1.030   pH 6.0 5.0 - 8.0   Glucose, UA 500 (A) NEGATIVE mg/dL   Hgb urine dipstick NEGATIVE NEGATIVE   Bilirubin Urine NEGATIVE NEGATIVE   Ketones, ur 15 (A) NEGATIVE mg/dL   Protein, ur 30 (A) NEGATIVE mg/dL   Urobilinogen, UA 0.2 0.0 - 1.0 mg/dL   Nitrite NEGATIVE NEGATIVE   Leukocytes, UA NEGATIVE NEGATIVE  Urine microscopic-add on  Result Value Ref Range   Urine-Other      NO FORMED ELEMENTS SEEN ON URINE MICROSCOPIC EXAMINATION  I-stat troponin, ED (only if pt is 56 y.o. or older & pain is above umbilicus)  not at [facility], [facility]  Result Value Ref Range   Troponin i, poc 0.00 0.00 - 0.08 ng/mL   Comment 3          POC CBG, ED  Result Value Ref Range   Glucose-Capillary 204 (H) 65 - 99 mg/dL    Imaging Review No results found.   EKG Interpretation   Date/Time:  Tuesday Oct 30 2014 00:12:49 EDT Ventricular Rate:  110 PR Interval:  160 QRS Duration: 88 QT Interval:  357 QTC Calculation: 483 R Axis:   42 Text Interpretation:  Sinus tachycardia Borderline low voltage, extremity  leads No significant change since last tracing Confirmed by OTTER  MD,  OLGA ([id number])  on 10/30/2014 1:05:11 AM      MDM   Final diagnoses:  None    1. Lightheadedness 2. dehydration  4:10:  She is feeling better with IV fluids. Ambulated to bathroom with minimal assistance. UA pending.  5:00: UA negative for infection. She is feeling much better after fluids, drinking PO's. She feels comfortable going home with close PCP follow up. Of note, the patient reports her pulse is "usually in the low 100's" which has been unchanged here.   Charlann Lange, PA-C 10/30/14 [place],  MD 10/30/14 [phone]

## 2014-10-30 NOTE — Discharge Instructions (Signed)
Dehydration, Adult Dehydration is when you lose more fluids from the body than you take in. Vital organs like the kidneys, brain, and heart cannot function without a proper amount of fluids and salt. Any loss of fluids from the body can cause dehydration.  CAUSES   Vomiting.  Diarrhea.  Excessive sweating.  Excessive urine output.  Fever. SYMPTOMS  Mild dehydration  Thirst.  Dry lips.  Slightly dry mouth. Moderate dehydration  Very dry mouth.  Sunken eyes.  Skin does not bounce back quickly when lightly pinched and released.  Dark urine and decreased urine production.  Decreased tear production.  Headache. Severe dehydration  Very dry mouth.  Extreme thirst.  Rapid, weak pulse (more than 100 beats per minute at rest).  Cold hands and feet.  Not able to sweat in spite of heat and temperature.  Rapid breathing.  Blue lips.  Confusion and lethargy.  Difficulty being awakened.  Minimal urine production.  No tears. DIAGNOSIS  Your caregiver will diagnose dehydration based on your symptoms and your exam. Blood and urine tests will help confirm the diagnosis. The diagnostic evaluation should also identify the cause of dehydration. TREATMENT  Treatment of mild or moderate dehydration can often be done at home by increasing the amount of fluids that you drink. It is best to drink small amounts of fluid more often. Drinking too much at one time can make vomiting worse. Refer to the home care instructions below. Severe dehydration needs to be treated at the hospital where you will probably be given intravenous (IV) fluids that contain water and electrolytes. HOME CARE INSTRUCTIONS   Ask your caregiver about specific rehydration instructions.  Drink enough fluids to keep your urine clear or pale yellow.  Drink small amounts frequently if you have nausea and vomiting.  Eat as you normally do.  Avoid:  Foods or drinks high in sugar.  Carbonated  drinks.  Juice.  Extremely hot or cold fluids.  Drinks with caffeine.  Fatty, greasy foods.  Alcohol.  Tobacco.  Overeating.  Gelatin desserts.  Wash your hands well to avoid spreading bacteria and viruses.  Only take over-the-counter or prescription medicines for pain, discomfort, or fever as directed by your caregiver.  Ask your caregiver if you should continue all prescribed and over-the-counter medicines.  Keep all follow-up appointments with your caregiver. SEEK MEDICAL CARE IF:  You have abdominal pain and it increases or stays in one area (localizes).  You have a rash, stiff neck, or severe headache.  You are irritable, sleepy, or difficult to awaken.  You are weak, dizzy, or extremely thirsty. SEEK IMMEDIATE MEDICAL CARE IF:   You are unable to keep fluids down or you get worse despite treatment.  You have frequent episodes of vomiting or diarrhea.  You have blood or green matter (bile) in your vomit.  You have blood in your stool or your stool looks black and tarry.  You have not urinated in 6 to 8 hours, or you have only urinated a small amount of very dark urine.  You have a fever.  You faint. MAKE SURE YOU:   Understand these instructions.  Will watch your condition.  Will get help right away if you are not doing well or get worse. Document Released: 06/01/2005 Document Revised: 08/24/2011 Document Reviewed: 01/19/2011 Exit[employer] Patient Information 2015 [employer]. This information is not intended to replace advice given to you by your health care provider. Make sure you discuss any questions you have with your health care   provider.  

## 2014-10-30 NOTE — ED Notes (Signed)
Pt states nausea and dizziness has improved from prior 8/10 rating to current 4/10 rating of dizziness/nausea.

## 2014-10-31 ENCOUNTER — Ambulatory Visit (INDEPENDENT_AMBULATORY_CARE_PROVIDER_SITE_OTHER): Payer: 59 | Admitting: Family Medicine

## 2014-10-31 ENCOUNTER — Encounter: Payer: Self-pay | Admitting: Family Medicine

## 2014-10-31 VITALS — BP 118/80 | HR 108 | Temp 99.2°F | Wt 286.2 lb

## 2014-10-31 DIAGNOSIS — I10 Essential (primary) hypertension: Secondary | ICD-10-CM

## 2014-10-31 DIAGNOSIS — R42 Dizziness and giddiness: Secondary | ICD-10-CM | POA: Diagnosis not present

## 2014-10-31 DIAGNOSIS — E119 Type 2 diabetes mellitus without complications: Secondary | ICD-10-CM

## 2014-10-31 DIAGNOSIS — E1165 Type 2 diabetes mellitus with hyperglycemia: Secondary | ICD-10-CM | POA: Diagnosis not present

## 2014-10-31 DIAGNOSIS — IMO0002 Reserved for concepts with insufficient information to code with codable children: Secondary | ICD-10-CM

## 2014-10-31 MED ORDER — INSULIN GLARGINE 100 UNIT/ML SOLOSTAR PEN: 30.0000 [IU] | PEN_INJECTOR | Freq: Every day | SUBCUTANEOUS | Status: DC

## 2014-10-31 MED ORDER — MECLIZINE HCL 25 MG PO TABS: 12.5000 mg | ORAL_TABLET | Freq: Three times a day (TID) | ORAL | Status: DC | PRN

## 2014-10-31 MED ORDER — METOPROLOL TARTRATE 25 MG PO TABS: 12.5000 mg | ORAL_TABLET | Freq: Two times a day (BID) | ORAL | Status: DC

## 2014-10-31 NOTE — Patient Instructions (Addendum)
Go to the lab on the way out.  We'll contact you with your lab report. Try taking the metoprolol 1/2 tab twice a day.   Try taking meclizine as needed and use the bed side exercise.  Drink plenty of fluids in the meantime.  Out of work today.  Go back when you feel better.  Take care.  Glad to see you.

## 2014-10-31 NOTE — Progress Notes (Signed)
Pre visit review using our clinic review tool, if applicable. No additional management support is needed unless otherwise documented below in the visit note.  Sugars in AM 130-180.  Max 250 later in the day.  Due for A1c.  D/w pt.    She had some days when she can't take her metoprolol due to sedation.  D/w pt about changing her dosing schedule.  Her BP has been okay o/w.    Sx started Saturday night.  Felt lightheaded, but it resolved.  Mild sx at the time.  The room didn't spin.  No sx Sunday.  Went to work Monday, felt slightly lightheaded then, then her sx got gradually worse.  Vomited in ER.  Had sensation of lightheadedness, then sensation of motion with head turning.    ER notes reviewed, troponin wnl at ER. She got better with IVF in the ER.    Meds, vitals, and allergies reviewed.   ROS: See HPI.  Otherwise, noncontributory.  GEN: nad, alert and oriented, obese HEENT: mucous membranes moist, TM wnl, nasal exam stuffy, OP wnl NECK: supple w/o LA CV: rrr.  no murmur PULM: ctab, no inc wob ABD: soft, +bs EXT: no edema Sx of vertigo return with head movement/turning, but not eye tracking

## 2014-11-01 ENCOUNTER — Other Ambulatory Visit: Payer: Self-pay | Admitting: Family Medicine

## 2014-11-01 ENCOUNTER — Other Ambulatory Visit: Payer: Self-pay

## 2014-11-01 DIAGNOSIS — R42 Dizziness and giddiness: Secondary | ICD-10-CM | POA: Insufficient documentation

## 2014-11-01 LAB — HEMOGLOBIN A1C: Hgb A1c MFr Bld: 10 % — ABNORMAL HIGH (ref 4.6–6.5)

## 2014-11-01 MED ORDER — METOPROLOL TARTRATE 25 MG PO TABS: 12.5000 mg | ORAL_TABLET | Freq: Two times a day (BID) | ORAL | Status: DC

## 2014-11-01 MED ORDER — INSULIN GLARGINE 100 UNIT/ML SOLOSTAR PEN: 30.0000 [IU] | PEN_INJECTOR | Freq: Every day | SUBCUTANEOUS | Status: DC

## 2014-11-01 NOTE — Telephone Encounter (Signed)
Sent. Thanks.   

## 2014-11-01 NOTE — Assessment & Plan Note (Signed)
See AVS re: metoprolo, take 12.5mg  BID for now.

## 2014-11-01 NOTE — Assessment & Plan Note (Signed)
Produced with head movement, possible that she has mult issues, ie relative dehydration, BPV.  D/w pt.  Would use bedside exercises and meclizine.  F/u prn.  She agrees. >25 minutes spent in face to face time with patient, >50% spent in counselling or coordination of care

## 2014-11-01 NOTE — Telephone Encounter (Signed)
Pt left v/m requesting refill metoprolol to [place] outpt pharmacy. Pt last seen 10/31/14 and metoprolol was decreased to 1/2 tab bid for now. Is it OK to refill?

## 2014-11-01 NOTE — Assessment & Plan Note (Signed)
See note on A1c.

## 2014-11-02 ENCOUNTER — Other Ambulatory Visit: Payer: Self-pay | Admitting: Family Medicine

## 2014-11-02 DIAGNOSIS — E1165 Type 2 diabetes mellitus with hyperglycemia: Secondary | ICD-10-CM

## 2014-11-02 DIAGNOSIS — IMO0002 Reserved for concepts with insufficient information to code with codable children: Secondary | ICD-10-CM

## 2014-11-06 NOTE — Telephone Encounter (Signed)
Patient notified by telephone that script has been sent to pharmacy.

## 2014-11-06 NOTE — Telephone Encounter (Signed)
Patient returned Regina's call.  Please call her back at [phone]

## 2014-11-26 ENCOUNTER — Other Ambulatory Visit: Payer: Self-pay

## 2014-11-26 VITALS — BP 122/80 | HR 114 | Ht 67.0 in | Wt 280.2 lb

## 2014-11-26 DIAGNOSIS — E119 Type 2 diabetes mellitus without complications: Secondary | ICD-10-CM

## 2014-11-26 NOTE — Patient Outreach (Signed)
[facility]) Care Management   11/26/2014  Sarah Walls [birth year] [id number]  Sarah Walls is an 54 y.o. female.  Member seen for initial office visit for Link to Wellness program for self management of Type 2 diabetes  Subjective: Member states that she is ready to improve her health and diabetes.  States that she has been adjusting to working night shift and how to eat.  States that she had been having more stress caring for her disabled son but now that he has moved to an assisted living facility she thinks she will be able to take care of herself.  States she has been trying to watch her diet and CHO but she is not sure about how much she should eat.  States she has not been exercising other than her everyday activities.  States that her bipolar depression has been under control and she continues to go to her therapist regularly.  Objective:   ROS  Physical Exam  Filed Vitals:   11/26/14 1554  BP: 122/80  Pulse: 114   Filed Weights   11/26/14 1554  Weight: 280 lb 3.2 oz (127.098 kg)    Current Medications:   Current Outpatient Prescriptions  Medication Sig Dispense Refill  . ALPRAZolam (XANAX) 0.5 MG tablet Take 0.5 mg by mouth at bedtime as needed (for panic attacks.). May take up to 4 tablets per day as needed    . aspirin 81 MG tablet Take 81 mg by mouth daily.    . Blood Glucose Monitoring Suppl (ACCU-CHEK AVIVA) kit Use to check blood sugar one or two times daily.     Marland Kitchen glucose blood (ACCU-CHEK AVIVA) test strip Check blood sugar one or two times daily as directed. 100 each 1  . glyBURIDE (DIABETA) 5 MG tablet Take 1 tablet (5 mg total) by mouth daily with breakfast. 90 tablet 3  . Insulin Glargine (LANTUS SOLOSTAR) 100 UNIT/ML Solostar Pen Inject 30 Units into the skin daily at 10 pm. If AM sugar >120, then add 1 unit daily. (Patient taking differently: Inject 40 Units into the skin daily at 10 pm. If AM sugar >120, then add 1 unit daily. Now up to 40-45  units) 5 pen PRN  . Insulin Pen Needle 31G X 5 MM MISC Use daily with insulin pen 100 each 3  . lamoTRIgine (LAMICTAL) 150 MG tablet Take 150 mg by mouth daily.    Marland Kitchen lisinopril (PRINIVIL,ZESTRIL) 10 MG tablet Take 1 tablet (10 mg total) by mouth daily. 90 tablet 1  . meclizine (ANTIVERT) 25 MG tablet Take 0.5-1 tablets (12.5-25 mg total) by mouth 3 (three) times daily as needed for dizziness (sedation caution).    . metoprolol tartrate (LOPRESSOR) 25 MG tablet Take 0.5 tablets (12.5 mg total) by mouth 2 (two) times daily. 90 tablet 1  . Multiple Vitamin (MULTIVITAMIN) tablet Take 1 tablet by mouth daily.      . simvastatin (ZOCOR) 40 MG tablet Take 1 tablet (40 mg total) by mouth at bedtime. 90 tablet 0   No current facility-administered medications for this visit.    Functional Status:   In your present state of health, do you have any difficulty performing the following activities: 11/26/2014  Hearing? N  Vision? N  Difficulty concentrating or making decisions? N  Walking or climbing stairs? N  Dressing or bathing? N  Doing errands, shopping? N    Fall/Depression Screening:    PHQ 2/9 Scores 11/26/2014  PHQ - 2 Score 0  [facility] CM Care Plan Problem One        Patient Outreach from 11/26/2014 in [place] Problem One  Elevated blood sugars related to dx of Type 2 DM   Care Plan for Problem One  Active   THN Long Term Goal (31-90 days)  Member will decrease hemoglobin  A1C to 8.5 within the next 90 days   THN Long Term Goal Start Date  11/26/14   Interventions for Problem One Long Term Goal  Given Link to Wellness diabetes teaching packet, Instructed on CHO counting and portion control, Instructed on use of diabetes medications, actions and possible side effects, Instructed on goals for blood sugars and when to check her blood sugars, Instructed on the importance of regular exercise  for glycemic control   THN CM Short Term Goal #1 (0-30 days)  Member will  complete EMMI programs by 12/27/14   [facility] CM Short Term Goal #1 Start Date  11/26/14   Interventions for Short Term Goal #1  Instructed on how to access EMMI programs   THN CM Short Term Goal #2 (0-30 days)  Member will attend Core diabetes class [facility] by 12/26/14   [facility] CM Short Term Goal #2 Start Date  11/26/14   Interventions for Short Term Goal #2  Order/referral sent to [facility] to schedule required class, Instructed member about required class       Assessment:   Member seen for initial office visit for Link to [employer] for self management of Type 2 diabetes.  Member has not been meeting self management goals with hemoglobin A1C of 10.  Member needs education on disease process, CHO counting, medications, self monitoring and healthy lifestyle.  Member motivated to make changes in her lifestyle at this time.  Plan:  Plan to attend required class [facility].  They will call you to schedule or call [phone] Plan to eat 30-45 GM (2-3) servings of carbohydrate a meal and 15 GM for snacks.  Plan to eat protein with snacks Plan to check blood sugars twice a day either fasting or 1 -2hrs after a meal.  Goals of 80-130 fasting and 180 or less 1 -2 hours after eating Plan to walk 3 days a week for 30 minutes.  Goal is to work up 150 minutes a week Plan to load Calorie [employer] and My Fitness Pal on your phone. Plan to complete EMMI programs by 12/27/14 Plan to return to Link to Wellness on 01/14/15 4PM Peter Garter RN, [facility] Care Management Coordinator-Link to [place] [facility] [phone]

## 2014-11-27 NOTE — Patient Instructions (Addendum)
1. Plan to attend required class at [facility].  They will call you to schedule or call [phone] 2. Plan to eat 30-45 GM (2-3) servings of carbohydrate a meal and 15 GM for snacks.  Plan to eat protein with snacks 3. Plan to check blood sugars twice a day either fasting or 1 -2hrs after a meal.  Goals of 80-130 fasting and 180 or less 1 -2 hours after eating 4. Plan to walk 3 days a week for 30 minutes.  Goal is to work up 150 minutes a week 5. Plan to load Calorie [employer] and My Fitness Pal on your phone. 6. Plan to complete EMMI programs by 12/27/14 7. Plan to return to Link to Wellness on 01/14/15 4PM

## 2014-12-10 ENCOUNTER — Other Ambulatory Visit: Payer: Self-pay

## 2014-12-22 ENCOUNTER — Encounter: Payer: 59 | Attending: Family Medicine

## 2014-12-22 VITALS — Ht 68.0 in | Wt 285.9 lb

## 2014-12-22 DIAGNOSIS — Z713 Dietary counseling and surveillance: Secondary | ICD-10-CM | POA: Insufficient documentation

## 2014-12-22 DIAGNOSIS — E119 Type 2 diabetes mellitus without complications: Secondary | ICD-10-CM | POA: Diagnosis not present

## 2014-12-22 DIAGNOSIS — Z794 Long term (current) use of insulin: Secondary | ICD-10-CM | POA: Insufficient documentation

## 2014-12-29 NOTE — Progress Notes (Signed)
Patient was seen on 12/22/14 for the complete diabetes self-management series at the [facility]. This is a part of the Link to [employer].  Handouts given during class include:  Living Well with Diabetes book  Carb Counting and Meal Planning book  Meal Plan Card  Carbohydrate guide  Meal planning worksheet  Low Sodium Flavoring Tips  The diabetes portion plate  Low Carbohydrate Snack Suggestions  A1c to eAG Conversion Chart  Diabetes Medications  Stress Management  Diabetes Recommended Care Schedule  Diabetes Success Plan  Core Class Satisfaction Survey  The following learning objectives were met by the patient during this course:  Describe diabetes  State some common risk factors for diabetes  Defines the role of glucose and insulin  Identifies type of diabetes and pathophysiology  Describe the relationship between diabetes and cardiovascular risk  State the members of the Healthcare Team  States the rationale for glucose monitoring  State when to test glucose  State their individual Target Range  State the importance of logging glucose readings  Describe how to interpret glucose readings  Identifies A1C target  Explain the correlation between A1c and eAG values  State symptoms and treatment of high blood glucose  State symptoms and treatment of low blood glucose  Explain proper technique for glucose testing  Identifies proper sharps disposal  Describe the role of different macronutrients on glucose  Explain how carbohydrates affect blood glucose  State what foods contain the most carbohydrates  Demonstrate carbohydrate counting  Demonstrate how to read Nutrition Facts food label  Describe effects of various fats on heart health  Describe the importance of good nutrition for health and healthy eating strategies  Describe techniques for managing your shopping, cooking and meal planning  List  strategies to follow meal plan when dining out  Describe the effects of alcohol on glucose and how to use it safely . State the amount of activity recommended for healthy living . Describe activities suitable for individual needs . Identify ways to regularly incorporate activity into daily life . Identify barriers to activity and ways to over come these barriers  Identify diabetes medications being personally used and their primary action for lowering glucose and possible side effects . Describe role of stress on blood glucose and develop strategies to address psychosocial issues . Identify diabetes complications and ways to prevent them  Explain how to manage diabetes during illness . Evaluate success in meeting personal goal . Establish 2-3 goals that they will plan to diligently work on until they return for the  49-monthfollow-up visit  Goals:   I will count my carb choices at most meals and snacks  I will be active 30 minutes or more 5 times a week  I will take my diabetes medications as scheduled  I will test my glucose at least 2 times a day,7days a week  I will look at patterns in my record book at least 4-5 days a month  To help manage stress I will  walk at least 3 times a week  Your patient has identified these potential barriers to change:  Stress  Your patient has identified their diabetes self-care support plan as  Family Support  Plan: Follow up with Link to [email or web address]

## 2015-01-03 ENCOUNTER — Other Ambulatory Visit: Payer: Self-pay | Admitting: *Deleted

## 2015-01-04 ENCOUNTER — Other Ambulatory Visit: Payer: Self-pay | Admitting: *Deleted

## 2015-01-04 MED ORDER — LANCETS MISC: Status: DC

## 2015-01-04 MED ORDER — LANCET DEVICE MISC
Status: DC
Start: 2015-01-04 — End: 2018-08-10

## 2015-01-04 MED ORDER — GLUCOSE BLOOD VI STRP: ORAL_STRIP | Status: DC

## 2015-01-14 ENCOUNTER — Other Ambulatory Visit: Payer: Self-pay

## 2015-01-14 VITALS — BP 120/80 | HR 98 | Resp 14 | Ht 67.0 in | Wt 293.0 lb

## 2015-01-14 DIAGNOSIS — E119 Type 2 diabetes mellitus without complications: Secondary | ICD-10-CM

## 2015-01-14 NOTE — Patient Instructions (Signed)
1. Plan to eat 30-45 GM (2-3) servings of carbohydrate a meal and 15 GM for snacks.  Plan to eat protein with snacks 2. Plan to check blood sugars twice a day either fasting or 1 -2hrs after a meal.  Goals of 80-130 fasting and 180 or less 1 -2 hours after eating 3. Plan to walk 3 days a week for 30 minutes.  Goal is to work up 150 minutes a week 4. Plan to complete EMMI programs by 03/16/15 5. Plan to see primary provider in September 6. Plan to return to Link to Wellness on 04/01/15

## 2015-01-14 NOTE — Patient Outreach (Signed)
[facility]) Care Management   01/14/2015  Sarah Walls 1960-11-19 [id number]  Sarah Walls is an 54 y.o. female.   Member seen for follow up office visit for Link to Wellness program for self management of Type 2 diabetes  Subjective: Member states that she learned a lot from the diabetes class.  States that she is now trying to eat more protein with her meals and snacks.  States she has seen her blood sugars go down some but with her varied schedule she eats at odd hours.  States she has walking at work and going up and down stairs for exercise but she has not been doing any regular exercise.  States she needs to see her provider later this month or in September.  Objective:   Review of Systems  All other systems reviewed and are negative. Member did not bring meter to visit but brought blood sugar logs which were reviewed.  Physical Exam  Today's Vitals   01/14/15 1603  BP: 120/80  Pulse: 98  Resp: 14  Height: 1.702 m ($Remove'5\' 7"'VvuidrO$ )  Weight: 293 lb (132.904 kg)  SpO2: 96%  PainSc: 0-No pain    Current Medications:   Current Outpatient Prescriptions  Medication Sig Dispense Refill  . ALPRAZolam (XANAX) 0.5 MG tablet Take 0.5 mg by mouth at bedtime as needed (for panic attacks.). May take up to 4 tablets per day as needed    . aspirin 81 MG tablet Take 81 mg by mouth daily.    Marland Kitchen glucose blood (TRUE METRIX BLOOD GLUCOSE TEST) test strip Use as instructed to test blood sugar one or two times daily.  Diagnosis:  E11.65  Insulin dependent 100 each 11  . glyBURIDE (DIABETA) 5 MG tablet Take 1 tablet (5 mg total) by mouth daily with breakfast. 90 tablet 3  . Insulin Glargine (LANTUS SOLOSTAR) 100 UNIT/ML Solostar Pen Inject 30 Units into the skin daily at 10 pm. If AM sugar >120, then add 1 unit daily. (Patient taking differently: Inject 50 Units into the skin daily at 10 pm. If AM sugar >120, then add 1 unit daily. Now up to 40-45 units) 5 pen PRN  . Insulin Pen Needle 31G X 5 MM  MISC Use daily with insulin pen 100 each 3  . lamoTRIgine (LAMICTAL) 150 MG tablet Take 150 mg by mouth daily.    Elmore Guise Device MISC Use to test blood sugar once or twice daily.  Diagnosis E11.65  Insulin dependent.  True Metrix 1 each 0  . Lancets MISC Use as directed to test blood sugar one or two times daily.  Diagnosis E11.65  Insulin dependent.  True Metrix 100 each 11  . lisinopril (PRINIVIL,ZESTRIL) 10 MG tablet Take 1 tablet (10 mg total) by mouth daily. 90 tablet 1  . metoprolol tartrate (LOPRESSOR) 25 MG tablet Take 0.5 tablets (12.5 mg total) by mouth 2 (two) times daily. 90 tablet 1  . Multiple Vitamin (MULTIVITAMIN) tablet Take 1 tablet by mouth daily.      . simvastatin (ZOCOR) 40 MG tablet Take 1 tablet (40 mg total) by mouth at bedtime. 90 tablet 0  . meclizine (ANTIVERT) 25 MG tablet Take 0.5-1 tablets (12.5-25 mg total) by mouth 3 (three) times daily as needed for dizziness (sedation caution). (Patient not taking: Reported on 01/14/2015)     No current facility-administered medications for this visit.    Functional Status:   In your present state of health, do you have any difficulty performing the  following activities: 01/14/2015 11/26/2014  Hearing? N N  Vision? N N  Difficulty concentrating or making decisions? N N  Walking or climbing stairs? N N  Dressing or bathing? N N  Doing errands, shopping? N N    Fall/Depression Screening:    PHQ 2/9 Scores 01/14/2015 12/29/2014 11/26/2014  PHQ - 2 Score 0 0 0   THN CM Care Plan Problem One        Patient Outreach from 01/14/2015 in [place] Problem One  Elevated blood sugars related to dx of Type 2 DM   Care Plan for Problem One  Active   THN Long Term Goal (31-90 days)  Member will decrease hemoglobin  A1C to 8.5 within the next 90 days   THN Long Term Goal Start Date  01/14/15 [facility] not due for hemoglobin A1C yet]   Interventions for Problem One Long Term Goal  Reviewed  CHO counting and portion  control, Discussed different  diabetes medications that she could take that would help decrease her blood sugars and help with weight loss, Instructed to discuss with her provider at her next visit, Reviewed goals for blood sugars and when to check her blood sugars, Reinforced on the importance of regular exercise  for glycemic control   THN CM Short Term Goal #1 (0-30 days)  Member will complete EMMI programs by 12/27/14   [facility] CM Short Term Goal #1 Start Date  11/26/14   [facility] CM Short Term Goal #1 Met Date  01/14/15 [did not complete reassigned]   Interventions for Short Term Goal #1  Instructed on how to access EMMI programs   THN CM Short Term Goal #2 (0-30 days)  Member will attend Core diabetes class at [facility] by 12/26/14   [facility] CM Short Term Goal #2 Start Date  11/26/14   [facility] CM Short Term Goal #2 Met Date  12/22/14 [Completed class on 12/22/14]      Assessment:   Member seen for follow up office visit for Link to [employer] for self management of Type 2 diabetes.  Member has made changes in her diet and is watching her CHO portions.  Fasting CBGs are ranging 114-273.  She is now taking 50 units of Lantus daily and she does not want to take meal time insulin.  Member has not been exercising regularly but has increased her daily activities.  Member to see provider in September and has list of free medications to review with provider.    Plan:   Plan to eat 30-45 GM (2-3) servings of carbohydrate a meal and 15 GM for snacks.  Plan to eat protein with snacks Plan to check blood sugars twice a day either fasting or 1 -2hrs after a meal.  Goals of 80-130 fasting and 180 or less 1 -2 hours after eating Plan to walk 3 days a week for 30 minutes.  Goal is to work up 150 minutes a week Plan to complete EMMI programs by 03/16/15 Plan to see primary provider in September Plan to return to Link to Wellness on 04/01/15 4PM Peter Garter RN, [facility] Care Management  Coordinator-Link to [place] Management [phone]

## 2015-01-16 ENCOUNTER — Other Ambulatory Visit: Payer: Self-pay | Admitting: Family Medicine

## 2015-02-15 ENCOUNTER — Other Ambulatory Visit: Payer: Self-pay | Admitting: *Deleted

## 2015-02-15 MED ORDER — SIMVASTATIN 40 MG PO TABS: 40.0000 mg | ORAL_TABLET | Freq: Every day | ORAL | Status: DC

## 2015-03-04 ENCOUNTER — Other Ambulatory Visit: Payer: Self-pay | Admitting: Family Medicine

## 2015-03-04 NOTE — Telephone Encounter (Signed)
Received refill request electronically Last office visit 10/31/14 Refill request does not match the medication list Is it okay to refill as requested?

## 2015-03-04 NOTE — Telephone Encounter (Signed)
Sent as is.  Thanks.  And needs DM2 f/u this fall.  Labs ahead of time.

## 2015-03-05 NOTE — Telephone Encounter (Signed)
Please schedule appointment as instructed. 

## 2015-03-06 NOTE — Telephone Encounter (Signed)
Pt going to check calendar and call back to r/s

## 2015-03-09 ENCOUNTER — Other Ambulatory Visit: Payer: Self-pay | Admitting: Family Medicine

## 2015-03-09 DIAGNOSIS — E669 Obesity, unspecified: Secondary | ICD-10-CM

## 2015-03-09 DIAGNOSIS — I10 Essential (primary) hypertension: Secondary | ICD-10-CM

## 2015-03-09 DIAGNOSIS — F319 Bipolar disorder, unspecified: Secondary | ICD-10-CM

## 2015-03-09 DIAGNOSIS — E559 Vitamin D deficiency, unspecified: Secondary | ICD-10-CM | POA: Insufficient documentation

## 2015-03-09 DIAGNOSIS — E1165 Type 2 diabetes mellitus with hyperglycemia: Secondary | ICD-10-CM

## 2015-03-09 DIAGNOSIS — IMO0002 Reserved for concepts with insufficient information to code with codable children: Secondary | ICD-10-CM

## 2015-03-11 ENCOUNTER — Other Ambulatory Visit (INDEPENDENT_AMBULATORY_CARE_PROVIDER_SITE_OTHER): Payer: 59

## 2015-03-11 DIAGNOSIS — E559 Vitamin D deficiency, unspecified: Secondary | ICD-10-CM

## 2015-03-11 DIAGNOSIS — I10 Essential (primary) hypertension: Secondary | ICD-10-CM

## 2015-03-11 DIAGNOSIS — E1165 Type 2 diabetes mellitus with hyperglycemia: Secondary | ICD-10-CM | POA: Diagnosis not present

## 2015-03-11 DIAGNOSIS — IMO0002 Reserved for concepts with insufficient information to code with codable children: Secondary | ICD-10-CM

## 2015-03-11 DIAGNOSIS — E669 Obesity, unspecified: Secondary | ICD-10-CM

## 2015-03-11 DIAGNOSIS — F319 Bipolar disorder, unspecified: Secondary | ICD-10-CM

## 2015-03-11 LAB — HEPATIC FUNCTION PANEL
ALBUMIN: 4 g/dL (ref 3.5–5.2)
ALT: 22 U/L (ref 0–35)
AST: 16 U/L (ref 0–37)
Alkaline Phosphatase: 58 U/L (ref 39–117)
Bilirubin, Direct: 0.1 mg/dL (ref 0.0–0.3)
TOTAL PROTEIN: 7 g/dL (ref 6.0–8.3)
Total Bilirubin: 0.3 mg/dL (ref 0.2–1.2)

## 2015-03-11 LAB — BASIC METABOLIC PANEL
BUN: 8 mg/dL (ref 6–23)
CALCIUM: 9.4 mg/dL (ref 8.4–10.5)
CO2: 29 mEq/L (ref 19–32)
CREATININE: 0.48 mg/dL (ref 0.40–1.20)
Chloride: 104 mEq/L (ref 96–112)
GFR: 141.95 mL/min (ref >60.00)
GLUCOSE: 119 mg/dL — AB (ref 70–99)
POTASSIUM: 4.2 meq/L (ref 3.5–5.1)
Sodium: 141 mEq/L (ref 135–145)

## 2015-03-11 LAB — VITAMIN D 25 HYDROXY (VIT D DEFICIENCY, FRACTURES): VITD: 13.73 ng/mL — ABNORMAL LOW (ref 30.00–100.00)

## 2015-03-11 LAB — LIPID PANEL
Cholesterol: 172 mg/dL (ref 0–200)
HDL: 50.3 mg/dL (ref >39.00)
LDL Cholesterol: 94 mg/dL (ref 0–99)
NONHDL: 121.43
Total CHOL/HDL Ratio: 3
Triglycerides: 138 mg/dL (ref 0.0–149.0)
VLDL: 27.6 mg/dL (ref 0.0–40.0)

## 2015-03-11 LAB — HEMOGLOBIN A1C: Hgb A1c MFr Bld: 7.9 % — ABNORMAL HIGH (ref 4.6–6.5)

## 2015-03-12 ENCOUNTER — Ambulatory Visit (INDEPENDENT_AMBULATORY_CARE_PROVIDER_SITE_OTHER): Payer: 59 | Admitting: Family Medicine

## 2015-03-12 ENCOUNTER — Encounter: Payer: Self-pay | Admitting: Family Medicine

## 2015-03-12 VITALS — BP 130/62 | HR 110 | Temp 97.9°F | Wt 286.0 lb

## 2015-03-12 DIAGNOSIS — E1165 Type 2 diabetes mellitus with hyperglycemia: Secondary | ICD-10-CM

## 2015-03-12 DIAGNOSIS — IMO0002 Reserved for concepts with insufficient information to code with codable children: Secondary | ICD-10-CM

## 2015-03-12 DIAGNOSIS — Z119 Encounter for screening for infectious and parasitic diseases, unspecified: Secondary | ICD-10-CM | POA: Diagnosis not present

## 2015-03-12 MED ORDER — SITAGLIPTIN PHOSPHATE 100 MG PO TABS: 100.0000 mg | ORAL_TABLET | Freq: Every day | ORAL | Status: DC

## 2015-03-12 MED ORDER — INSULIN GLARGINE 100 UNIT/ML SOLOSTAR PEN: PEN_INJECTOR | SUBCUTANEOUS | Status: DC

## 2015-03-12 NOTE — Progress Notes (Signed)
Pre visit review using our clinic review tool, if applicable. No additional management support is needed unless otherwise documented below in the visit note.  Diabetes:  Using medications without difficulties: yes Hypoglycemic episodes: no Hyperglycemic episodes: no Feet problems: no Blood Sugars averaging: ~80-100 eye exam within last year: yes, due for f/u this fall.   She is in the wellness program and that is helping.  A1c is improved.  D/w pt.  Not at goal, but better.   Sugars are more uniform recently.   Insulin up to ~60 units per day.  Lipids are improved, d/w pt.   She is optimistic about her progress and future.    Pt opts in for HCV and HIV screening with next set of labs.  D/w pt re: routine screening.    Flu shot to be done at work.  D/w pt.   Vit d def not d/w pt at [place].  See f/u phone note.   Meds, vitals, and allergies reviewed.   ROS: See HPI.  Otherwise negative.    GEN: nad, alert and oriented, obese HEENT: mucous membranes moist NECK: supple w/o LA CV: rrr. PULM: ctab, no inc wob ABD: soft, +bs EXT: no edema SKIN: no acute rash  Diabetic foot exam: Normal inspection No skin breakdown No calluses  Normal DP pulses Normal sensation to light touch and monofilament Nails normal

## 2015-03-12 NOTE — Patient Instructions (Addendum)
Stop glyburide.  Start [place].   Start with 1/2 tab a day for about 10 days.  You'll likely need to decrease your insulin by 1 unit a day, over multiple days.   Take care.  Glad to see you.  Recheck labs in about 3 months before a visit.

## 2015-03-13 ENCOUNTER — Telehealth: Payer: Self-pay | Admitting: Family Medicine

## 2015-03-13 DIAGNOSIS — E559 Vitamin D deficiency, unspecified: Secondary | ICD-10-CM

## 2015-03-13 MED ORDER — VITAMIN D (ERGOCALCIFEROL) 1.25 MG (50000 UNIT) PO CAPS: 50000.0000 [IU] | ORAL_CAPSULE | ORAL | Status: DC

## 2015-03-13 NOTE — Telephone Encounter (Signed)
Left message for patient to return my call.

## 2015-03-13 NOTE — Telephone Encounter (Signed)
Call pt. Forgot to talk to patient about Vit D at the [place].  Level was low.  Is she taking otc vit D? If so please update EMR- okay to continue.   With her low level, would add on 50,000 units vit D once a week x12 weeks and recheck with other labs in about 3-4 months.   rx sent.   Thanks.

## 2015-03-13 NOTE — Assessment & Plan Note (Addendum)
She is in the wellness program and that is helping.  A1c is improved.  D/w pt.  Not at goal, but better.   Sugars are more uniform recently.   Insulin up to ~60 units per day.  Lipids are improved, d/w pt.   She is optimistic about her progress and future.   Continue with diet and meds and exercise, she may have some lag from the A1c.  Recheck in about 3 months.   Stop glyburide. Start [place].  Start with 1/2 tab a day for about 10 days.  Will likely need to decrease insulin by 1 unit a day, over multiple days.  She agrees.

## 2015-03-14 NOTE — Telephone Encounter (Signed)
Left message for patient to return my call.

## 2015-03-18 NOTE — Telephone Encounter (Signed)
Noted, thanks.  Will await info from patient.

## 2015-03-18 NOTE — Telephone Encounter (Signed)
Patient notified as instructed by telephone and verbalized understanding. Patient stated that she has not been taking Vitamin D, but did pick up the script at the pharmacy and has taken one. Offered to schedule follow-up lab appointment, but patient stated that she already has it scheduled. Patient stated that she needs a letter for a class that she is taking, but it has to be worded exactly right, so she will be sending Dr. Damita Dunnings a mychart message with what the letter has to say. Patient stated that she was asleep and is not thinking clearly now, but will send a message later.

## 2015-03-19 ENCOUNTER — Encounter: Payer: Self-pay | Admitting: Family Medicine

## 2015-03-20 ENCOUNTER — Encounter: Payer: Self-pay | Admitting: *Deleted

## 2015-03-20 ENCOUNTER — Telehealth: Payer: Self-pay | Admitting: Family Medicine

## 2015-03-20 NOTE — Telephone Encounter (Signed)
Letter mailed to patient - as instructed.

## 2015-03-20 NOTE — Telephone Encounter (Signed)
Please make sure letter printed and please send it to the patient.  Thanks.

## 2015-04-01 ENCOUNTER — Ambulatory Visit: Payer: 59

## 2015-04-01 ENCOUNTER — Other Ambulatory Visit: Payer: Self-pay | Admitting: Family Medicine

## 2015-04-03 ENCOUNTER — Encounter: Payer: Self-pay | Admitting: Family Medicine

## 2015-04-04 ENCOUNTER — Other Ambulatory Visit: Payer: Self-pay | Admitting: Family Medicine

## 2015-04-04 MED ORDER — INSULIN GLARGINE 100 UNIT/ML SOLOSTAR PEN: PEN_INJECTOR | SUBCUTANEOUS | Status: DC

## 2015-04-16 LAB — HM DIABETES EYE EXAM

## 2015-04-18 ENCOUNTER — Other Ambulatory Visit: Payer: Self-pay

## 2015-04-18 VITALS — BP 122/84 | HR 118 | Resp 14 | Ht 67.0 in | Wt 293.6 lb

## 2015-04-18 DIAGNOSIS — E119 Type 2 diabetes mellitus without complications: Secondary | ICD-10-CM

## 2015-04-18 DIAGNOSIS — Z794 Long term (current) use of insulin: Principal | ICD-10-CM

## 2015-04-18 NOTE — Patient Instructions (Signed)
1. Plan to eat 30-45 GM (2-3) servings of carbohydrate a meal and 15 GM for snacks.  Plan to eat protein with snacks 2. Plan to check blood sugars twice a day either fasting or 1 -2hrs after a meal.  Goals of 80-130 fasting and 180 or less 1 -2 hours after eating 3. Plan to walk 3 days a week for 30 minutes.  Goal is to work up 150 minutes a week 4. Plan to complete EMMI programs by 06/16/15 5. Plan to see primary provider in December 6. Plan to return to Link to Wellness on 07/11/15 at 3:30PM

## 2015-04-18 NOTE — Patient Outreach (Signed)
[facility]) Care Management   04/18/2015  Sarah Walls [birth year] [id number]  Sarah Walls is an 54 y.o. female.   Member seen for follow up office visit for Link to Wellness program for self management of Type 2 diabetes  Subjective: Member states that she saw Dr. Damita Dunnings on 9/27 and her hemoglobin A1C  has gone down to 7.9.  States that he stopped her glyburide and started her on Januvia.  States that her fasting sugars went up some when she switched to the Januvia but they have started to be better.  States she still has high readings after eating.  States she is trying to walk more at work.  States she is trying to eat  more consistently everyday no matter what her schedule.  Objective:   Review of Systems  All other systems reviewed and are negative. Member did not bring glucometer for review  Physical Exam  Today's Vitals   04/18/15 1530  BP: 122/84  Pulse: 118  Resp: 14  Height: 1.702 m (5\' 7" )  Weight: 293 lb 9.6 oz (133.176 kg)  SpO2: 96%  PainSc: 0-No pain    Current Medications:   Current Outpatient Prescriptions  Medication Sig Dispense Refill  . ALPRAZolam (XANAX) 0.5 MG tablet Take 0.5 mg by mouth at bedtime as needed (for panic attacks.). May take up to 4 tablets per day as needed    . aspirin 81 MG tablet Take 81 mg by mouth daily.    . B-D UF III MINI PEN NEEDLES 31G X 5 MM MISC USE DAILY WITH INSULIN PEN 100 each 1  . glucose blood (TRUE METRIX BLOOD GLUCOSE TEST) test strip Use as instructed to test blood sugar one or two times daily.  Diagnosis:  E11.65  Insulin dependent 100 each 11  . Insulin Glargine (LANTUS SOLOSTAR) 100 UNIT/ML Solostar Pen Inject for up to 60 units daily. If AM sugar >120, add 1 unit per day.  If <80, decrease 1 unit 15 mL PRN  . lamoTRIgine (LAMICTAL) 150 MG tablet Take 150 mg by mouth daily.    Elmore Guise Device MISC Use to test blood sugar once or twice daily.  Diagnosis E11.65  Insulin dependent.  True Metrix 1 each 0  .  Lancets MISC Use as directed to test blood sugar one or two times daily.  Diagnosis E11.65  Insulin dependent.  True Metrix 100 each 11  . lisinopril (PRINIVIL,ZESTRIL) 10 MG tablet Take 1 tablet (10 mg total) by mouth daily. 90 tablet 1  . Multiple Vitamin (MULTIVITAMIN) tablet Take 1 tablet by mouth daily.      . simvastatin (ZOCOR) 40 MG tablet Take 1 tablet (40 mg total) by mouth at bedtime. 90 tablet 0  . sitaGLIPtin (JANUVIA) 100 MG tablet Take 1 tablet (100 mg total) by mouth daily. 90 tablet 3  . Vitamin D, Ergocalciferol, (DRISDOL) 50000 UNITS CAPS capsule Take 1 capsule (50,000 Units total) by mouth every 7 (seven) days. 12 capsule 0   No current facility-administered medications for this visit.    Functional Status:   In your present state of health, do you have any difficulty performing the following activities: 04/18/2015 01/14/2015  Hearing? N N  Vision? N N  Difficulty concentrating or making decisions? N N  Walking or climbing stairs? N N  Dressing or bathing? N N  Doing errands, shopping? N N    Fall/Depression Screening:    PHQ 2/9 Scores 04/18/2015 01/14/2015 12/29/2014 11/26/2014  PHQ - 2  Score 0 0 0 0    Assessment:    Member seen for follow up office visit for Link to Wellness program for self management of Type 2 diabetes.  Member is not at goal of hemoglobin A1C of 7 or below but has improved from 10.2 to 7.9.  Member reports trying to eat more consistently and to watch the CHO she is eating.  Reports her CBGs were up some after she started the Januvia but are improving. Member continues to have elevated post prandial readings ranging 177-326.  Fasting CBGs range 98-144.  Member reports walking at work and one day a week when she is off.   Plan:  1. Plan to eat 30-45 GM (2-3) servings of carbohydrate a meal and 15 GM for snacks.  Plan to eat protein with snacks 2. Plan to check blood sugars twice a day either fasting or 1 -2hrs after a meal.  Goals of 80-130 fasting and  180 or less 1 -2 hours after eating 3. Plan to walk 3 days a week for 30 minutes.  Goal is to work up 150 minutes a week 4. Plan to complete EMMI programs by 06/16/15 5. Plan to see primary provider in December 6. Plan to return to Link to Wellness on 07/11/15 at 3:30PM  [facility] CM Care Plan Problem One        Most Recent Value   Care Plan Problem One  Elevated blood sugars related to dx of Type 2 DM   Role Documenting the Problem One  Care Management Coordinator   Care Plan for Problem One  Active   THN Long Term Goal (31-90 days)  Member will decrease hemoglobin  A1C to 7 within the next 90 days   THN Long Term Goal Start Date  04/18/15 [ Hemoglobin A1C decreased to 7.9]   Interventions for Problem One Long Term Goal  Reviewed  CHO counting and portion control, Prasied for lower hemoglobin A1C results, discussed medications and possible choices that MD could prescribe if her hemoglobin A1C is not at goal with next checkup, Reinforced to try to be consistent with her food choices and timing of her meals, Reviewed goals for blood sugars and when to check her blood sugars, Reassigned  EMMI programs and reviewed how to access them, Reinforced on the importance of regular exercise  for glycemic control    Peter Garter RN, [facility] Care Management Coordinator-Link to [place] Management [phone]

## 2015-05-13 ENCOUNTER — Other Ambulatory Visit: Payer: Self-pay | Admitting: Family Medicine

## 2015-05-27 ENCOUNTER — Other Ambulatory Visit: Payer: Self-pay | Admitting: Family Medicine

## 2015-06-11 ENCOUNTER — Ambulatory Visit: Payer: 59 | Admitting: Family Medicine

## 2015-06-12 ENCOUNTER — Other Ambulatory Visit: Payer: 59

## 2015-06-14 ENCOUNTER — Ambulatory Visit: Payer: 59 | Admitting: Family Medicine

## 2015-06-20 MED FILL — LANTUS SOLOSTAR 100 UNITS/M: 100 | 25 days supply | Qty: 15 | Fill #3

## 2015-06-28 ENCOUNTER — Other Ambulatory Visit: Payer: Self-pay | Admitting: Family Medicine

## 2015-06-28 ENCOUNTER — Other Ambulatory Visit (INDEPENDENT_AMBULATORY_CARE_PROVIDER_SITE_OTHER): Payer: 59

## 2015-06-28 DIAGNOSIS — E1165 Type 2 diabetes mellitus with hyperglycemia: Secondary | ICD-10-CM | POA: Diagnosis not present

## 2015-06-28 DIAGNOSIS — Z119 Encounter for screening for infectious and parasitic diseases, unspecified: Secondary | ICD-10-CM | POA: Diagnosis not present

## 2015-06-28 DIAGNOSIS — IMO0002 Reserved for concepts with insufficient information to code with codable children: Secondary | ICD-10-CM

## 2015-06-28 DIAGNOSIS — E559 Vitamin D deficiency, unspecified: Secondary | ICD-10-CM | POA: Diagnosis not present

## 2015-06-28 LAB — HEMOGLOBIN A1C: HEMOGLOBIN A1C: 10 % — AB (ref 4.6–6.5)

## 2015-06-28 LAB — VITAMIN D 25 HYDROXY (VIT D DEFICIENCY, FRACTURES): VITD: 26.48 ng/mL — ABNORMAL LOW (ref 30.00–100.00)

## 2015-06-29 LAB — HEPATITIS C ANTIBODY: HCV AB: NEGATIVE

## 2015-06-29 LAB — HIV ANTIBODY (ROUTINE TESTING W REFLEX): HIV: NONREACTIVE

## 2015-07-05 ENCOUNTER — Ambulatory Visit: Payer: 59 | Admitting: Family Medicine

## 2015-07-11 ENCOUNTER — Ambulatory Visit: Payer: Self-pay

## 2015-07-11 ENCOUNTER — Other Ambulatory Visit: Payer: Self-pay

## 2015-07-11 MED FILL — JANUVIA 100 MG TABLET: 100 | 90 days supply | Qty: 90 | Fill #1

## 2015-07-11 NOTE — Patient Outreach (Signed)
[facility]) Care Management  07/11/2015  Banesa Altepeter 1960/06/21 [id number]  Telephone call to reschedule today's appointment as member has an appointment with Dr.Duncan tomorrow Member states that her numbers have not been good and she expects the MD to make some changes. Link to Wellness visit rescheduled for 07/19/15. Peter Garter RN, [facility] Care Management Coordinator-Link to [place] Management [phone]

## 2015-07-12 ENCOUNTER — Encounter: Payer: Self-pay | Admitting: Family Medicine

## 2015-07-12 ENCOUNTER — Ambulatory Visit (INDEPENDENT_AMBULATORY_CARE_PROVIDER_SITE_OTHER): Payer: 59 | Admitting: Family Medicine

## 2015-07-12 VITALS — BP 122/78 | HR 96 | Temp 98.5°F | Wt 289.2 lb

## 2015-07-12 DIAGNOSIS — E119 Type 2 diabetes mellitus without complications: Secondary | ICD-10-CM

## 2015-07-12 DIAGNOSIS — Z794 Long term (current) use of insulin: Secondary | ICD-10-CM

## 2015-07-12 DIAGNOSIS — IMO0001 Reserved for inherently not codable concepts without codable children: Secondary | ICD-10-CM

## 2015-07-12 DIAGNOSIS — E1165 Type 2 diabetes mellitus with hyperglycemia: Secondary | ICD-10-CM | POA: Diagnosis not present

## 2015-07-12 DIAGNOSIS — E559 Vitamin D deficiency, unspecified: Secondary | ICD-10-CM | POA: Diagnosis not present

## 2015-07-12 MED ORDER — INSULIN GLARGINE 100 UNIT/ML SOLOSTAR PEN: PEN_INJECTOR | SUBCUTANEOUS | Status: DC

## 2015-07-12 MED ORDER — VITAMIN D (ERGOCALCIFEROL) 1.25 MG (50000 UNIT) PO CAPS: 50000.0000 [IU] | ORAL_CAPSULE | ORAL | Status: DC

## 2015-07-12 MED FILL — LANTUS SOLOSTAR 100 UNITS/M: 100 | 18 days supply | Qty: 15 | Fill #0

## 2015-07-12 MED FILL — VIT D2 1.25 MG (50,000 UNIT: 1.25 MG | 84 days supply | Qty: 12 | Fill #0

## 2015-07-12 NOTE — Patient Instructions (Addendum)
You can call for a mammogram at [facility] or  [place]  Schedule your next visit for 30 minutes for your pap and diabetes follow up.    Keep going up on your insulin in the meantime, 1 unit a day.   Restart vitamin D.   Recheck labs before your next visit.   Take care.  Glad to see you.

## 2015-07-12 NOTE — Progress Notes (Signed)
Pre visit review using our clinic review tool, if applicable. No additional management support is needed unless otherwise documented below in the visit note.  Flu shot done 03/2015 at work.    Diabetes:  Using medications without difficulties:yes Hypoglycemic episodes:no Hyperglycemic episodes:no Feet problems:no Blood Sugars averaging: averaging ~200.  She had trouble over the holidays.  Her more recent readings are better, 150-200 recently fasting.   eye exam within last year: yes, done 05/2015- no retinopathy.   She is going to work on weight with diet and exercise. D/w pt.   A1c d/w pt.  It appears that her A1c lags her recent changes.    HIV and HCV neg.  D/w pt.    Vit D improved but still low.  D/w pt.    Meds, vitals, and allergies reviewed.   ROS: See HPI.  Otherwise negative.    GEN: nad, alert and oriented HEENT: mucous membranes moist NECK: supple w/o LA CV: rrr. PULM: ctab, no inc wob ABD: soft, +bs EXT: no edema SKIN: no acute rash

## 2015-07-14 NOTE — Assessment & Plan Note (Signed)
Blood Sugars averaging: averaging ~200 prev. She had trouble over the holidays. Her more recent readings are better, 150-200 recently fasting.  eye exam within last year: yes, done 05/2015- no retinopathy.  She is going to work on weight with diet and exercise. D/w pt.  A1c d/w pt. It appears that her A1c lags her recent changes.  Inc insulin daily until AM sugars improved, see AVS and med list.  Recheck in about 3 months.   >25 minutes spent in face to face time with patient, >50% spent in counselling or coordination of care.

## 2015-07-14 NOTE — Assessment & Plan Note (Signed)
Vit D improved but still low. D/w pt.  Restart replacement and recheck later on.

## 2015-07-18 DIAGNOSIS — F3181 Bipolar II disorder: Secondary | ICD-10-CM | POA: Diagnosis not present

## 2015-07-18 DIAGNOSIS — F332 Major depressive disorder, recurrent severe without psychotic features: Secondary | ICD-10-CM | POA: Diagnosis not present

## 2015-07-19 ENCOUNTER — Other Ambulatory Visit: Payer: Self-pay

## 2015-07-19 VITALS — BP 122/82 | HR 108 | Resp 16 | Ht 67.0 in | Wt 286.2 lb

## 2015-07-19 DIAGNOSIS — Z794 Long term (current) use of insulin: Principal | ICD-10-CM

## 2015-07-19 DIAGNOSIS — E119 Type 2 diabetes mellitus without complications: Secondary | ICD-10-CM

## 2015-07-19 NOTE — Patient Outreach (Signed)
[facility]) Care Management   07/19/2015  Emelene Heppler January 16, 1961 [id number]  Sarah Walls is an 55 y.o. female.   Member seen for follow up office visit for Link to Wellness program for self management of Type 2 diabetes  Subjective:  Member states that she saw Dr.Duncan on 1/27 and her hemoglobin A1C had gone up to 10%.  States that she had a period around the holidays that she was not watching her diet closely.  States that her bipolar disorder was worse and she was more depressed at the time.  States that her psychologist has increased her Pristiq and she is now feeling better.  States for the last month she has been watching her diet better and she is seeing her blood sugars improve. States she has been walking at work and she is thinking about going to the aquatic center.    Objective:   Review of Systems  All other systems reviewed and are negative. Reviewed glucometer 7 day average-209 14 day average-206 30 day average-206  Physical Exam Today's Vitals   07/19/15 1320  BP: 122/82  Pulse: 108  Resp: 16  Height: 1.702 m (5\' 7" )  Weight: 286 lb 3.2 oz (129.819 kg)  SpO2: 97%  PainSc: 0-No pain   Current Medications:   Current Outpatient Prescriptions  Medication Sig Dispense Refill  . ALPRAZolam (XANAX) 0.5 MG tablet Take 0.5 mg by mouth at bedtime as needed (for panic attacks.). May take up to 4 tablets per day as needed    . aspirin 81 MG tablet Take 81 mg by mouth daily.    . B-D UF III MINI PEN NEEDLES 31G X 5 MM MISC USE DAILY WITH INSULIN PEN 100 each 1  . glucose blood (TRUE METRIX BLOOD GLUCOSE TEST) test strip Use as instructed to test blood sugar one or two times daily.  Diagnosis:  E11.65  Insulin dependent 100 each 11  . Insulin Glargine (LANTUS SOLOSTAR) 100 UNIT/ML Solostar Pen Inject up to 80 units daily. If AM sugar >120, add 1 unit per day.  If <80, decrease 1 unit (Patient taking differently: 64 Units. Inject up to 80 units daily. If AM sugar  >120, add 1 unit per day.  If <80, decrease 1 unit) 10 pen PRN  . lamoTRIgine (LAMICTAL) 150 MG tablet Take 150 mg by mouth daily.    Elmore Guise Device MISC Use to test blood sugar once or twice daily.  Diagnosis E11.65  Insulin dependent.  True Metrix 1 each 0  . Lancets MISC Use as directed to test blood sugar one or two times daily.  Diagnosis E11.65  Insulin dependent.  True Metrix 100 each 11  . lisinopril (PRINIVIL,ZESTRIL) 10 MG tablet TAKE 1 TABLET BY MOUTH ONCE DAILY 90 tablet 1  . Multiple Vitamin (MULTIVITAMIN) tablet Take 1 tablet by mouth daily.      . simvastatin (ZOCOR) 40 MG tablet TAKE 1 TABLET BY MOUTH AT BEDTIME 90 tablet 0  . sitaGLIPtin (JANUVIA) 100 MG tablet Take 1 tablet (100 mg total) by mouth daily. 90 tablet 3  . Vitamin D, Ergocalciferol, (DRISDOL) 50000 units CAPS capsule Take 1 capsule (50,000 Units total) by mouth every 7 (seven) days. 12 capsule 0  . desvenlafaxine (PRISTIQ) 50 MG 24 hr tablet Take 100 mg by mouth daily.      No current facility-administered medications for this visit.    Functional Status:   In your present state of health, do you have any difficulty performing  the following activities: 07/19/2015 04/18/2015  Hearing? N N  Vision? N N  Difficulty concentrating or making decisions? N N  Walking or climbing stairs? N N  Dressing or bathing? N N  Doing errands, shopping? N N    Fall/Depression Screening:    PHQ 2/9 Scores 07/19/2015 04/18/2015 01/14/2015 12/29/2014 11/26/2014  PHQ - 2 Score 1 0 0 0 0    Assessment:   Member seen for follow up office visit for Link to Wellness program for self management of Type 2 diabetes.  Member is not at goal of hemoglobin A1C of 7.  Last hemoglobin A1C increased to 10% Member reports trying to eat more consistently and trying to  watch the CHO she is eating since the first of the year. Reports her CBGs are ranging 124-261 Member continues to have elevated post prandial readings. Member reports walking at work 2-3  times a week.  Weight is down 7.4 Lbs since last Link to Wellness visit.     Plan:   Plan to eat 30-45 GM (2-3) servings of carbohydrate a meal and 15 GM for snacks.  Plan to eat protein with snacks Plan to check blood sugars twice a day either fasting or 1 -2hrs after a meal.  Goals of 80-130 fasting and 180 or less 1 -2 hours after eating Plan to walk 3 days a week for 30 minutes at work. Plan to go to aquatic center once a week.  Goal is to work up 150 minutes a week Plan to complete EMMI programs by 09/14/15 Plan to see Dr. Damita Dunnings on 10/11/15 Plan to return to Link to Wellness on 10/17/15 at 3:30PM [facility] CM Care Plan Problem One        Most Recent Value   Care Plan Problem One  Elevated blood sugars related to dx of Type 2 DM   Role Documenting the Problem One  Care Management [place] for Problem One  Active   THN Long Term Goal (31-90 days)  Member will decrease hemoglobin  A1C to 7 within the next 90 days   THN Long Term Goal Start Date  07/19/15 [hemoglobin A1C increased to 10%]   Interventions for Problem One Long Term Goal  Reviewed  CHO counting and portion control, Encouraged to use My Fitness Pal or Calorie [employer] to help count CHO and calories, Again discussed medications and possible choices that MD could prescribe if her hemoglobin A1C is not at goal with next checkup,  Reviewed goals for blood sugars and when to check her blood sugars, Reassigned  EMMI programs and reviewed how to access them, Reinforced on the importance of regular exercise  for glycemic control Instructed to check her blood sugars before and after swimming and to eat a snack as needed to prevent hypoglycemia    Peter Garter RN, [facility] Care Management Coordinator-Link to [place] Management [phone]

## 2015-07-19 NOTE — Patient Instructions (Signed)
1. Plan to eat 30-45 GM (2-3) servings of carbohydrate a meal and 15 GM for snacks.  Plan to eat protein with snacks 2. Plan to check blood sugars twice a day either fasting or 1 -2hrs after a meal.  Goals of 80-130 fasting and 180 or less 1 -2 hours after eating 3. Plan to walk 3 days a week for 30 minutes at work. Plan to go to aquatic center once a week.  Goal is to work up 150 minutes a week 4. Plan to complete EMMI programs by 09/14/15 5. Plan to see Dr. Damita Dunnings on 10/11/15 6. Plan to return to Link to Wellness on 10/17/15 at 3:30PM

## 2015-07-31 MED FILL — BD PEN NDL MINI 31GX5MM: 31G X 5 MM | 90 days supply | Qty: 100 | Fill #1

## 2015-08-12 MED FILL — LANTUS SOLOSTAR 100 UNITS/M: 100 | 18 days supply | Qty: 15 | Fill #1

## 2015-08-12 MED FILL — lamoTRIgine 150 MG TABS: 150 | 90 days supply | Qty: 90 | Fill #2

## 2015-08-12 MED FILL — TRUE METRIX GLUCOSE TEST ST: 90 days supply | Qty: 200 | Fill #2

## 2015-08-19 MED FILL — PRISTIQ ER 50 MG TABLET: 50 | 90 days supply | Qty: 90 | Fill #2

## 2015-09-02 MED FILL — LANTUS SOLOSTAR 100 UNITS/M: 100 | 18 days supply | Qty: 15 | Fill #2

## 2015-09-09 MED FILL — LISINOPRIL 10 MG TABLET: 10 | 90 days supply | Qty: 90 | Fill #1

## 2015-09-18 DIAGNOSIS — F332 Major depressive disorder, recurrent severe without psychotic features: Secondary | ICD-10-CM | POA: Diagnosis not present

## 2015-09-18 DIAGNOSIS — F3181 Bipolar II disorder: Secondary | ICD-10-CM | POA: Diagnosis not present

## 2015-09-18 MED FILL — TRIAZOLAM 0.25 MG TABLET: 0.25 | 1 days supply | Qty: 2 | Fill #0

## 2015-09-18 MED FILL — ALPRAZolam 1 MG TABS: 1 | 30 days supply | Qty: 90 | Fill #0

## 2015-09-20 MED FILL — AMOXICILLIN 500 MG CAPSULE: 500 | 7 days supply | Qty: 22 | Fill #0

## 2015-09-20 MED FILL — DOXYCYCLINE HYC 20 MG TAB: 20 | 30 days supply | Qty: 60 | Fill #0

## 2015-09-20 MED FILL — CHLORHEXIDINE 0.12% RINSE: 0.12 | 15 days supply | Qty: 473 | Fill #0

## 2015-09-24 MED FILL — LANTUS SOLOSTAR 100 UNITS/M: 100 | 18 days supply | Qty: 15 | Fill #3

## 2015-10-04 ENCOUNTER — Other Ambulatory Visit (INDEPENDENT_AMBULATORY_CARE_PROVIDER_SITE_OTHER): Payer: 59

## 2015-10-04 DIAGNOSIS — E559 Vitamin D deficiency, unspecified: Secondary | ICD-10-CM

## 2015-10-04 DIAGNOSIS — E119 Type 2 diabetes mellitus without complications: Secondary | ICD-10-CM

## 2015-10-04 LAB — HEMOGLOBIN A1C: Hgb A1c MFr Bld: 9.2 % — ABNORMAL HIGH (ref 4.6–6.5)

## 2015-10-04 LAB — VITAMIN D 25 HYDROXY (VIT D DEFICIENCY, FRACTURES): VITD: 32.91 ng/mL (ref 30.00–100.00)

## 2015-10-11 ENCOUNTER — Ambulatory Visit (INDEPENDENT_AMBULATORY_CARE_PROVIDER_SITE_OTHER): Payer: 59 | Admitting: Family Medicine

## 2015-10-11 ENCOUNTER — Encounter: Payer: Self-pay | Admitting: Family Medicine

## 2015-10-11 ENCOUNTER — Other Ambulatory Visit: Payer: Self-pay | Admitting: Family Medicine

## 2015-10-11 VITALS — BP 102/80 | HR 111 | Temp 98.2°F | Ht 68.0 in | Wt 283.0 lb

## 2015-10-11 DIAGNOSIS — E1165 Type 2 diabetes mellitus with hyperglycemia: Secondary | ICD-10-CM

## 2015-10-11 DIAGNOSIS — Z794 Long term (current) use of insulin: Secondary | ICD-10-CM

## 2015-10-11 DIAGNOSIS — IMO0001 Reserved for inherently not codable concepts without codable children: Secondary | ICD-10-CM

## 2015-10-11 MED ORDER — INSULIN GLARGINE 100 UNIT/ML SOLOSTAR PEN: 74.0000 [IU] | PEN_INJECTOR | Freq: Every day | SUBCUTANEOUS | Status: DC

## 2015-10-11 MED FILL — lamoTRIgine 150 MG TABS: 150 | 90 days supply | Qty: 90 | Fill #0

## 2015-10-11 MED FILL — SIMVASTATIN 40 MG TABLET: 40 | 90 days supply | Qty: 90 | Fill #0

## 2015-10-11 NOTE — Progress Notes (Signed)
Pre visit review using our clinic review tool, if applicable. No additional management support is needed unless otherwise documented below in the visit note.  She had dental infection, currently treated with doxy, she had f/u pending for 10/31/15.    Diabetes:  Using medications without difficulties:yes Hypoglycemic episodes:no Hyperglycemic episodes:no Feet problems:no Blood Sugars averaging: lowest is usually ~102, highest readings are lower then prev (not 300s- max now in the 200s) eye exam within last year: done 04/2015- no sig retinopathy per patient report.   A1c down to 9.2.  D/w pt.  She wanted to see about checking A1c more often.   She is working on diet.  She was working more on walking but that wasn't going well (harder on her knees) so she has been going to the gym.   10 lbs intentional weight loss.   Pap and mammogram d/w pt.  She'll call about a mammogram.  She has irregular periods, now about once per year.  She is likely menopausal or nearly.  She'll consider pap here or at gyn.  D/w pt.   Meds, vitals, and allergies reviewed.   ROS: Per HPI unless specifically indicated in ROS section   GEN: nad, alert and oriented NECK: supple w/o LA CV: rrr. PULM: ctab, no inc wob ABD: soft, +bs EXT: no edema

## 2015-10-11 NOTE — Patient Instructions (Addendum)
You can call for a mammogram at the [facility] [place] [phone]  Ask about an appointment for a pap on the way out, along with recheck A1c.  I'll check on A1c pricing.  Please check on the OTC kits.  Don't change your meds for now.  Keep working on diet and exercise.   Take care.  Glad to see you.

## 2015-10-13 NOTE — Assessment & Plan Note (Signed)
A1c down to 9.2.  D/w pt.  She wanted to see about checking A1c more often.   She'll check on OTC kits and I asked lab to get a price check done here.   She thinks that more data points will help her manage her progress/situation.  This may be a reasonable point, d/w pt.  She is working on diet.  She was working more on walking but that wasn't going well (harder on her knees) so she has been going to the gym.   10 lbs intentional weight loss.  Continue work on weight loss.

## 2015-10-17 ENCOUNTER — Ambulatory Visit: Payer: 59

## 2015-10-21 MED FILL — LANTUS SOLOSTAR 100 UNITS/M: 100 | 18 days supply | Qty: 15 | Fill #4

## 2015-10-21 MED FILL — DESVENLAFAXINE SUC ER 100 M: 100 | 90 days supply | Qty: 90 | Fill #0

## 2015-10-24 ENCOUNTER — Other Ambulatory Visit: Payer: Self-pay | Admitting: Family Medicine

## 2015-10-24 MED ORDER — INSULIN PEN NEEDLE 31G X 5 MM MISC: Status: DC

## 2015-10-24 MED FILL — UNIFINE PENTIPS 31GX3/16: 31G X 5 MM | 90 days supply | Qty: 100 | Fill #0

## 2015-10-24 NOTE — Telephone Encounter (Signed)
Monica with [facility] pharmacy called to get refill on pen needles; pt seen 09/2015 refilled per protocol.

## 2015-10-24 NOTE — Addendum Note (Signed)
Addended by: Josetta Huddle on: 10/24/2015 08:38 AM   Modules accepted: Orders

## 2015-10-30 MED FILL — TRIAZOLAM 0.25 MG TABLET: 0.25 | 1 days supply | Qty: 3 | Fill #0

## 2015-11-06 ENCOUNTER — Telehealth: Payer: Self-pay | Admitting: Family Medicine

## 2015-11-06 NOTE — Telephone Encounter (Signed)
There has been a GI bug in the community.  Reasonable to try imodium 1-2 tabs a day.   That may help.  Update [place] as needed.  If not passing blood and if still making urine and w/o fever, then should be okay.   Keep drinking plenty of fluids.  If lightheaded, then skip a dose of lisinopril.  Thanks.

## 2015-11-06 NOTE — Telephone Encounter (Signed)
Patient Name: Sarah Walls  DOB: 1961-04-10    Initial Comment Caller states, has diarrhea for 3 days. She doesn't think she has a fever.    Nurse Assessment  Nurse: Raphael Gibney, RN, Vanita Ingles Date/Time (Eastern Time): 11/06/2015 11:30:16 AM  Confirm and document reason for call. If symptomatic, describe symptoms. You must click the next button to save text entered. ---Caller states she has had diarrhea since Monday. No vomiting. She has intermittent nausea which is mild. No fever. Has diarrhea every 3-4 hrs. Cramping gets better when she has the diarrhea stool. Blood sugar has been elevated. She is urinating.  Has the patient traveled out of the country within the last 30 days? ---No  Does the patient have any new or worsening symptoms? ---Yes  Will a triage be completed? ---Yes  Related visit to physician within the last 2 weeks? ---No  Does the PT have any chronic conditions? (i.e. diabetes, asthma, etc.) ---Yes  List chronic conditions. ---IBS; diabetes  Is this a behavioral health or substance abuse call? ---No     Guidelines    Guideline Title Affirmed Question Affirmed Notes  Diarrhea [1] MODERATE diarrhea (e.g., 4-6 times / day more than normal) AND [2] present > 48 hours (2 days)    Final Disposition User   See Physician within 24 Hours Stringer, RN, Vanita Ingles    Comments  Pt is supposed to work Midwife and is wanting to know if there is anything she can take for the diarrhea rather than coming into the office. Please call pt back and let her know about medication.   Referrals  GO TO FACILITY REFUSED   Disagree/Comply: Disagree  Disagree/Comply Reason: Disagree with instructions

## 2015-11-06 NOTE — Telephone Encounter (Signed)
Patient notified as instructed by telephone and verbalized understanding. 

## 2015-11-07 ENCOUNTER — Other Ambulatory Visit: Payer: Self-pay | Admitting: Family Medicine

## 2015-11-07 DIAGNOSIS — N6310 Unspecified lump in the right breast, unspecified quadrant: Secondary | ICD-10-CM

## 2015-11-14 MED FILL — LANTUS SOLOSTAR 100 UNITS/M: 100 | 18 days supply | Qty: 15 | Fill #5

## 2015-11-15 ENCOUNTER — Ambulatory Visit
Admission: RE | Admit: 2015-11-15 | Discharge: 2015-11-15 | Disposition: A | Discharge status: Home / Self Care | Payer: 59 | Source: Ambulatory Visit | Attending: Family Medicine | Admitting: Family Medicine

## 2015-11-15 DIAGNOSIS — N6310 Unspecified lump in the right breast, unspecified quadrant: Secondary | ICD-10-CM

## 2015-11-15 DIAGNOSIS — R928 Other abnormal and inconclusive findings on diagnostic imaging of breast: Secondary | ICD-10-CM | POA: Diagnosis not present

## 2015-11-25 MED FILL — JANUVIA 100 MG TABLET: 100 | 90 days supply | Qty: 90 | Fill #2

## 2015-11-25 MED FILL — CHLORHEXIDINE 0.12% RINSE: 0.12 | 15 days supply | Qty: 473 | Fill #1

## 2015-11-28 ENCOUNTER — Encounter: Payer: Self-pay | Admitting: Obstetrics and Gynecology

## 2015-11-28 ENCOUNTER — Ambulatory Visit (INDEPENDENT_AMBULATORY_CARE_PROVIDER_SITE_OTHER): Payer: 59 | Admitting: Obstetrics and Gynecology

## 2015-11-28 VITALS — BP 128/88 | HR 108 | Resp 16 | Ht 68.0 in | Wt 287.0 lb

## 2015-11-28 DIAGNOSIS — Z01419 Encounter for gynecological examination (general) (routine) without abnormal findings: Secondary | ICD-10-CM | POA: Diagnosis not present

## 2015-11-28 DIAGNOSIS — Z124 Encounter for screening for malignant neoplasm of cervix: Secondary | ICD-10-CM

## 2015-11-28 DIAGNOSIS — Z1151 Encounter for screening for human papillomavirus (HPV): Secondary | ICD-10-CM | POA: Diagnosis not present

## 2015-11-28 NOTE — Progress Notes (Signed)
Subjective:     Sarah Walls is a 55 y.o. female  With BMI 71 who is here for a comprehensive physical exam. The patient reports no problems. She is not sexually active. She reports a 5-day period every 8-9 months. She denies any pelvic pain.  Past Medical History  Diagnosis Date  . Depression     BAD2 - Dr. Albertine Patricia  . Panic attacks     BAD2 - Dr. Albertine Patricia  . Hypertension   . Diabetes mellitus   . Hyperlipidemia   . Bipolar disorder ([place])   . Arthritis   . Personal history of colonic polyps - adenomas 10/13/2013    12/2013 - 6 adenomas max 12 mm - repeat colonoscopy  2018   . Cataract    Past Surgical History  Procedure Laterality Date  . Tonsillectomy  1969  . Cesarean section  1990    Fetal Distress  . Eye surgery    . Cataract extraction Bilateral 2015   Family History  Problem Relation Age of Onset  . Cancer Mother     Metastatic liver CA  . Thyroid disease Mother     Hypothyroidism  . Vision loss Mother     Eye tumor, removed orbit at the end of 2004  . Melanoma Mother   . Diabetes Mother   . Diabetes Father   . Heart disease Father     CABG, coronary disease  . Thyroid disease Sister     Hypothyroidism  . Breast cancer Neg Hx   . Colon cancer Maternal Aunt 70    Social History   Social History  . Marital Status: Married    Spouse Name: N/A  . Number of Children: 1  . Years of Education: N/A   Occupational History  . [employer]     Social History Main Topics  . Smoking status: Never Smoker   . Smokeless tobacco: Never Used  . Alcohol Use: No  . Drug Use: No  . Sexual Activity: Not on file   Other Topics Concern  . Not on file   Social History Narrative   05/2009 Masters in counseling in disabled.   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       Mother lived in home.  (Deceased quickly Spring 09 from metastatic melanoma of the eye everywhere but significant to the liver).   Health  Maintenance  Topic Date Due  . PAP SMEAR  06/05/2012  . INFLUENZA VACCINE  01/14/2016  . FOOT EXAM  03/11/2016  . HEMOGLOBIN A1C  04/04/2016  . OPHTHALMOLOGY EXAM  04/15/2016  . COLONOSCOPY  12/12/2016  . MAMMOGRAM  11/14/2017  . PNEUMOCOCCAL POLYSACCHARIDE VACCINE (2) 10/14/2018  . TETANUS/TDAP  10/14/2023  . Hepatitis C Screening  Completed  . HIV Screening  Completed    Review of Systems Pertinent items are noted in HPI.   Objective:  Blood pressure 128/88, pulse 108, resp. rate 16, height 5\' 8"  (1.727 m), weight 287 lb (130.182 kg), last menstrual period 08/28/2015.     GENERAL: Well-developed, well-nourished female in no acute distress.  HEENT: Normocephalic, atraumatic. Sclerae anicteric.  NECK: Supple. Normal thyroid.  LUNGS: Clear to auscultation bilaterally.  HEART: Regular rate and rhythm. BREASTS: Symmetric in size. No palpable masses or lymphadenopathy, skin changes, or nipple drainage. ABDOMEN: Soft, nontender, nondistended. No organomegaly. PELVIC: Normal external female genitalia. Vagina is pink and rugated.  Normal discharge. Normal appearing cervix. Uterus is normal in size. No adnexal mass  or tenderness. EXTREMITIES: No cyanosis, clubbing, or edema, 2+ distal pulses.    Assessment:    Healthy female exam.      Plan:    pap smear collected Patient had a screening mammogram recently which was normal Patient will be contacted with any abnormal results See After Visit Summary for Counseling Recommendations

## 2015-11-29 LAB — CYTOLOGY - PAP

## 2015-12-02 MED FILL — LANTUS SOLOSTAR 100 UNITS/M: 100 | 30 days supply | Qty: 24 | Fill #6

## 2015-12-09 ENCOUNTER — Other Ambulatory Visit: Payer: Self-pay

## 2015-12-09 VITALS — BP 128/88 | HR 102 | Resp 16 | Ht 67.0 in | Wt 284.4 lb

## 2015-12-09 DIAGNOSIS — Z794 Long term (current) use of insulin: Principal | ICD-10-CM

## 2015-12-09 DIAGNOSIS — E119 Type 2 diabetes mellitus without complications: Secondary | ICD-10-CM

## 2015-12-09 NOTE — Patient Instructions (Signed)
1. Plan to eat 30-45 GM (2-3) servings of carbohydrate a meal and 15 GM for snacks.  Plan to eat protein with snacks 2. Plan to check blood sugars twice a day either fasting or 1 -2hrs after a meal.  Goals of 80-130 fasting and 180 or less 1 -2 hours after eating 3. Plan to go to gym 3 days a week for 30 minutes at work.  Goal is to work up 150 minutes a week 4. Plan to complete EMMI programs by 03/15/16 5. Plan to see Dr. Damita Dunnings on 01/10/16 6. Plan to return to Link to Wellness on 03/09/16 at [place]

## 2015-12-09 NOTE — Patient Outreach (Signed)
[place] [facility]) Care Management   12/09/2015  Kamea Allinson [birth year] [id number]  Sarah Walls is an 55 y.o. female.   Member seen for follow up office visit for Link to Wellness program for self management of Type 2 diabetes  Subjective: Member states that her hemoglobin A1C was down to 9.2% when she saw the MD in April.  States she is trying to watch her portion sizes.  States that she is now going to the gym at work at least 2 times a week for 30 minutes.  States this worked with her schedule much better.  States that she had a stomach virus a few months ago and her blood sugars went up during this time as the foods she could tolerate were high in CHO.  Objective:   Review of Systems  All other systems reviewed and are negative.   Physical Exam Today's Vitals   12/09/15 0854  BP: 128/88  Pulse: 102  Resp: 16  Height: 1.702 m (5\' 7" )  Weight: 284 lb 6.4 oz (129.003 kg)  SpO2: 96%  PainSc: 0-No pain   Encounter Medications:   Outpatient Encounter Prescriptions as of 12/09/2015  Medication Sig Note  . ALPRAZolam (XANAX) 0.5 MG tablet Take 0.5 mg by mouth at bedtime as needed (for panic attacks.). May take up to 4 tablets per day as needed   . aspirin 81 MG tablet Take 81 mg by mouth daily.   . chlorhexidine (PERIDEX) 0.12 % solution  11/28/2015: Received from: External Pharmacy  . Cholecalciferol (VITAMIN D) 2000 units CAPS Take 2,000 Units by mouth daily.   Marland Kitchen desvenlafaxine (PRISTIQ) 50 MG 24 hr tablet Take 100 mg by mouth daily.    Marland Kitchen glucose blood (TRUE METRIX BLOOD GLUCOSE TEST) test strip Use as instructed to test blood sugar one or two times daily.  Diagnosis:  E11.65  Insulin dependent   . Insulin Glargine (LANTUS SOLOSTAR) 100 UNIT/ML Solostar Pen Inject 74 Units into the skin daily at 10 pm. If AM sugar >120, add 1 unit per day.  If <80, decrease 1 unit (Patient taking differently: Inject 75 Units into the skin daily at 10 pm. If AM sugar >120, add 1 unit per day.   If <80, decrease 1 unit)   . Insulin Pen Needle (B-D UF III MINI PEN NEEDLES) 31G X 5 MM MISC USE DAILY WITH INSULIN PEN   . lamoTRIgine (LAMICTAL) 150 MG tablet Take 150 mg by mouth daily.   Elmore Guise Device MISC Use to test blood sugar once or twice daily.  Diagnosis E11.65  Insulin dependent.  True Metrix   . Lancets MISC Use as directed to test blood sugar one or two times daily.  Diagnosis E11.65  Insulin dependent.  True Metrix   . lisinopril (PRINIVIL,ZESTRIL) 10 MG tablet TAKE 1 TABLET BY MOUTH ONCE DAILY   . Multiple Vitamin (MULTIVITAMIN) tablet Take 1 tablet by mouth daily.     . simvastatin (ZOCOR) 40 MG tablet TAKE 1 TABLET BY MOUTH AT BEDTIME   . sitaGLIPtin (JANUVIA) 100 MG tablet Take 1 tablet (100 mg total) by mouth daily.   . triazolam (HALCION) 0.25 MG tablet Reported on 12/09/2015 11/28/2015: Received from: External Pharmacy   No facility-administered encounter medications on file as of 12/09/2015.    Functional Status:   In your present state of health, do you have any difficulty performing the following activities: 07/19/2015 04/18/2015  Hearing? N N  Vision? N N  Difficulty concentrating or making decisions?  N N  Walking or climbing stairs? N N  Dressing or bathing? N N  Doing errands, shopping? N N    Fall/Depression Screening:    PHQ 2/9 Scores 12/09/2015 07/19/2015 04/18/2015 01/14/2015 12/29/2014 11/26/2014  PHQ - 2 Score 0 1 0 0 0 0    Assessment:  Member seen for follow up office visit for Link to Wellness program for self management of Type 2 diabetes. Member is not at goal of hemoglobin A1C of 7% or below.Her last hemoglobin A1C was 9.2%. Member reports trying trying to watch her CHO portion sizes Reports her CBGs are ranging 100-240 Member continues to have elevated post prandial readings. Member reports going to the gym at work and exercising twice a week for 30 minutes. Member up to date with eye exam and dental checkups. Plan:   Plan to eat 30-45 GM (2-3)  servings of carbohydrate a meal and 15 GM for snacks.  Plan to eat protein with snacks Plan to check blood sugars twice a day either fasting or 1 -2hrs after a meal.  Goals of 80-130 fasting and 180 or less 1 -2 hours after eating Plan to go to gym 3 days a week for 30 minutes at work.  Goal is to work up 150 minutes a week Plan to complete EMMI programs by 03/15/16 Plan to see Dr. Damita Dunnings on 01/10/16 Plan to return to Link to Wellness on 03/09/16 at 10 AM  [facility] CM Care Plan Problem One        Most Recent Value   Care Plan Problem One  Elevated blood sugars related to dx of Type 2 DM   Role Documenting the Problem One  Care Management [place] for Problem One  Active   THN Long Term Goal (31-90 days)  Member will decrease hemoglobin  A1C to 7 within the next 90 days   THN Long Term Goal Start Date  12/09/15 [Continue hemoglobin A1C decreased to 9.2%]   Interventions for Problem One Long Term Goal  Reviewed  CHO counting and portion control, Reinforced to try using My Fitness Pal or Calorie King to help count CHO and calories, Instructed on sick days and given handout on sick day rules,  Reviewed goals for blood sugars and when to check her blood sugars, Reassigned  EMMI programs and reviewed how to access them    Peter Garter RN, [facility] Care Management Coordinator-Link to [place] Management [phone]

## 2015-12-29 ENCOUNTER — Other Ambulatory Visit: Payer: Self-pay | Admitting: Family Medicine

## 2015-12-29 DIAGNOSIS — Z794 Long term (current) use of insulin: Principal | ICD-10-CM

## 2015-12-29 DIAGNOSIS — IMO0001 Reserved for inherently not codable concepts without codable children: Secondary | ICD-10-CM

## 2015-12-29 DIAGNOSIS — E1165 Type 2 diabetes mellitus with hyperglycemia: Principal | ICD-10-CM

## 2016-01-02 DIAGNOSIS — F332 Major depressive disorder, recurrent severe without psychotic features: Secondary | ICD-10-CM | POA: Diagnosis not present

## 2016-01-02 DIAGNOSIS — F3181 Bipolar II disorder: Secondary | ICD-10-CM | POA: Diagnosis not present

## 2016-01-03 ENCOUNTER — Other Ambulatory Visit: Payer: 59

## 2016-01-10 ENCOUNTER — Other Ambulatory Visit: Payer: Self-pay | Admitting: Family Medicine

## 2016-01-10 ENCOUNTER — Ambulatory Visit: Payer: 59 | Admitting: Family Medicine

## 2016-01-10 MED FILL — LANTUS SOLOSTAR 100 UNITS/M: 100 | 30 days supply | Qty: 24 | Fill #7

## 2016-01-10 MED FILL — TRUE METRIX GLUCOSE TEST ST: 50 days supply | Qty: 100 | Fill #0

## 2016-01-10 MED FILL — lamoTRIgine 150 MG TABS: 150 | 90 days supply | Qty: 90 | Fill #1

## 2016-01-15 MED FILL — UNIFINE PENTIPS 31GX3/16: 31G X 5 MM | 90 days supply | Qty: 100 | Fill #1

## 2016-01-22 MED FILL — AMOXICILLIN 500 MG CAPSULE: 500 | 7 days supply | Qty: 22 | Fill #0

## 2016-01-22 MED FILL — HYDROCODON-APAP 5-325: 5-325 | 3 days supply | Qty: 12 | Fill #0

## 2016-01-23 ENCOUNTER — Other Ambulatory Visit: Payer: 59

## 2016-01-27 ENCOUNTER — Other Ambulatory Visit (INDEPENDENT_AMBULATORY_CARE_PROVIDER_SITE_OTHER): Payer: 59

## 2016-01-27 DIAGNOSIS — Z794 Long term (current) use of insulin: Secondary | ICD-10-CM | POA: Diagnosis not present

## 2016-01-27 DIAGNOSIS — IMO0001 Reserved for inherently not codable concepts without codable children: Secondary | ICD-10-CM

## 2016-01-27 DIAGNOSIS — E1165 Type 2 diabetes mellitus with hyperglycemia: Secondary | ICD-10-CM

## 2016-01-27 LAB — HEMOGLOBIN A1C: Hgb A1c MFr Bld: 8.6 % — ABNORMAL HIGH (ref 4.6–6.5)

## 2016-01-31 ENCOUNTER — Ambulatory Visit (INDEPENDENT_AMBULATORY_CARE_PROVIDER_SITE_OTHER): Payer: 59 | Admitting: Family Medicine

## 2016-01-31 ENCOUNTER — Encounter: Payer: Self-pay | Admitting: Family Medicine

## 2016-01-31 DIAGNOSIS — IMO0001 Reserved for inherently not codable concepts without codable children: Secondary | ICD-10-CM

## 2016-01-31 DIAGNOSIS — E1165 Type 2 diabetes mellitus with hyperglycemia: Secondary | ICD-10-CM | POA: Diagnosis not present

## 2016-01-31 DIAGNOSIS — Z794 Long term (current) use of insulin: Secondary | ICD-10-CM | POA: Diagnosis not present

## 2016-01-31 MED ORDER — VITAMIN D 50 MCG (2000 UT) PO CAPS: 2000.0000 [IU] | ORAL_CAPSULE | Freq: Every day | ORAL | Status: DC

## 2016-01-31 MED ORDER — INSULIN GLARGINE 100 UNIT/ML SOLOSTAR PEN: 75.0000 [IU] | PEN_INJECTOR | Freq: Every day | SUBCUTANEOUS | Status: DC

## 2016-01-31 NOTE — Progress Notes (Signed)
Diabetes:  Using medications without difficulties:yes Hypoglycemic episodes:no Hyperglycemic episodes:no Feet problems:occ mild tingling but not consistent Blood Sugars averaging: now in the 80-90s in the AMs, improved.  eye exam within last year:yes A1c improved, not at goal but down to 8.6, d/w pt.  She had a dental infection and recently got the tooth pulled.  Her sugar is improve in the meantime.   Diet d/w pt.  She is working on diet and trying to avoid carbs.  She has modified her diet due to her work schedule.  She needed letter for work.  See letter re: knee pain.    Meds, vitals, and allergies reviewed.   ROS: Per HPI unless specifically indicated in ROS section   GEN: nad, alert and oriented HEENT: mucous membranes moist NECK: supple w/o LA CV: rrr. PULM: ctab, no inc wob ABD: soft, +bs EXT: no edema SKIN: no acute rash  Diabetic foot exam: Normal inspection No skin breakdown No calluses  Normal DP pulses Normal sensation to light touch and monofilament Nails normal

## 2016-01-31 NOTE — Progress Notes (Signed)
Pre visit review using our clinic review tool, if applicable. No additional management support is needed unless otherwise documented below in the visit note. 

## 2016-01-31 NOTE — Patient Instructions (Signed)
Keep working on your diet and recheck A1c in about 3 months.  Take care.  Glad to see you.  Thanks for your effort.

## 2016-02-02 NOTE — Assessment & Plan Note (Signed)
A1c improved, not at goal but down to 8.6, d/w pt.  She had a dental infection and recently got the tooth pulled.  Her sugar is improve in the meantime.   Diet d/w pt.  She is working on diet and trying to avoid carbs.  She has modified her diet due to her work schedule. Continue as is.  Recheck in about 3 months.  She agrees.

## 2016-02-18 MED FILL — LANTUS SOLOSTAR 100 UNITS/M: 100 | 30 days supply | Qty: 24 | Fill #8

## 2016-02-26 ENCOUNTER — Other Ambulatory Visit: Payer: Self-pay | Admitting: Family Medicine

## 2016-03-09 ENCOUNTER — Other Ambulatory Visit: Payer: Self-pay

## 2016-03-09 VITALS — BP 122/84 | HR 101 | Resp 14 | Ht 67.0 in | Wt 284.4 lb

## 2016-03-09 DIAGNOSIS — E119 Type 2 diabetes mellitus without complications: Secondary | ICD-10-CM

## 2016-03-09 DIAGNOSIS — Z794 Long term (current) use of insulin: Principal | ICD-10-CM

## 2016-03-09 NOTE — Patient Instructions (Signed)
1. Plan to eat 30-45 GM (2-3) servings of carbohydrate a meal and 15 GM for snacks.  Plan to eat protein with snacks 2. Plan to check blood sugars twice a day either fasting or 1 -2hrs after a meal.  Goals of 80-130 fasting and 180 or less 1 -2 hours after eating 3. Plan to walk 20 minutes 3 days a week.  Goal is to work up 150 minutes a week 4. Plan to complete EMMI programs by 03/15/16 5. Plan to see Dr. Damita Dunnings on 05/01/16 6. Plan to return to Link to Wellness on 06/09/16 at [facility]

## 2016-03-09 NOTE — Patient Outreach (Signed)
[place] [facility]) Care Management   03/09/2016  Sarah Walls, Sarah Walls [id number]  Sarah Walls is an 55 y.o. female.   Member seen for follow up office visit for Link to Wellness program for self management of Type 2 diabetes  Subjective: Member states that her hemoglobin A1C was down to 8.6% when she saw her MD last month.  States that her blood sugars have improved since she had some dental surgery a few months ago.  States she is trying to watch what she eats but she has food that is brought to work that is not too healthy that she eats.  States she is trying to walk at work and go to the gym at least once a week.  States that she and her husband frequently go out to eat when she is not working.  Objective:   Review of Systems  Musculoskeletal: Positive for joint pain.  All other systems reviewed and are negative.   Physical Exam Today's Vitals   03/09/16 1014 03/09/16 1020  BP: 122/84   Pulse: (!) 101   Resp: 14   SpO2: 96%   Weight: 284 lb 6.4 oz (129 kg)   Height: 1.702 m (5\' 7" )   PainSc: 0-No pain 0-No pain   Encounter Medications:   Outpatient Encounter Prescriptions as of 03/09/2016  Medication Sig Note  . ALPRAZolam (XANAX) 0.5 MG tablet Take 0.5 mg by mouth at bedtime as needed (for panic attacks.). May take up to 4 tablets per day as needed   . aspirin 81 MG tablet Take 81 mg by mouth daily.   . chlorhexidine (PERIDEX) 0.12 % solution  11/28/2015: Received from: External Pharmacy  . Cholecalciferol (VITAMIN D) 2000 units CAPS Take 1 capsule (2,000 Units total) by mouth daily.   Marland Kitchen desvenlafaxine (PRISTIQ) 50 MG 24 hr tablet Take 100 mg by mouth daily.    . Insulin Glargine (LANTUS SOLOSTAR) 100 UNIT/ML Solostar Pen Inject 75 Units into the skin daily at 10 pm. If AM sugar >120, add 1 unit per day.  If <80, decrease 1 unit   . Insulin Pen Needle (B-D UF III MINI PEN NEEDLES) 31G X 5 MM MISC USE DAILY WITH INSULIN PEN   . lamoTRIgine (LAMICTAL) 150 MG tablet  Take 150 mg by mouth daily.   Elmore Guise Device MISC Use to test blood sugar once or twice daily.  Diagnosis E11.65  Insulin dependent.  True Metrix   . Lancets MISC Use as directed to test blood sugar one or two times daily.  Diagnosis E11.65  Insulin dependent.  True Metrix   . lisinopril (PRINIVIL,ZESTRIL) 10 MG tablet TAKE 1 TABLET BY MOUTH ONCE DAILY   . Multiple Vitamin (MULTIVITAMIN) tablet Take 1 tablet by mouth daily.     . simvastatin (ZOCOR) 40 MG tablet TAKE 1 TABLET BY MOUTH AT BEDTIME   . sitaGLIPtin (JANUVIA) 100 MG tablet Take 1 tablet (100 mg total) by mouth daily.   . TRUE METRIX BLOOD GLUCOSE TEST test strip USE AS INSTRUCTED TO TEST BLOOD SUGAR ONE OR TWO TIMES DAILY    No facility-administered encounter medications on file as of 03/09/2016.     Functional Status:   In your present state of health, do you have any difficulty performing the following activities: 03/09/2016 12/09/2015  Hearing? N N  Vision? N N  Difficulty concentrating or making decisions? N N  Walking or climbing stairs? N N  Dressing or bathing? - N  Doing errands, shopping? N N  Some recent data might be hidden    Fall/Depression Screening:    PHQ 2/9 Scores 03/09/2016 12/09/2015 07/19/2015 04/18/2015 01/14/2015 12/29/2014 11/26/2014  PHQ - 2 Score 0 0 1 0 0 0 0    Assessment:  Member seen for follow up office visit for Link to Wellness program for self management of Type 2 diabetes. Member is not at goal of hemoglobin A1C of 7% or below.Her last hemoglobin A1C was 8.6% which is improved from 9.2%. Member reports trying trying to watch her CHO portion sizes Reports her CBGs are ranging 85-110 fasting and 150-250 post prandial.   Member continues to have elevated post prandial readings. Member reports going to the gym at work and exercising once a week for 30 minutes and walking at work also. Member up to date with eye exam and dental checkups  Plan:  Plan to eat 30-45 GM (2-3) servings of carbohydrate a  meal and 15 GM for snacks.  Plan to eat protein with snacks Plan to check blood sugars twice a day either fasting or 1 -2hrs after a meal.  Goals of 80-130 fasting and 180 or less 1 -2 hours after eating Plan to walk 20 minutes 3 days a week.  Goal is to work up 150 minutes a week Plan to complete EMMI programs by 03/15/16 Plan to see Dr. Damita Dunnings on 05/01/16 Plan to return to Link to Wellness on 06/09/16 at [place] Problem One   Flowsheet Row Most Recent Value  Care Plan Problem One  Elevated blood sugars as evidenced by elevated hemoglobin A1C of 10.2% related to dx of Type 2 DM  Role Documenting the Problem One  Care Management Coordinator  Care Plan for Problem One  Active  THN Long Term Goal (31-90 days)  Member will decrease hemoglobin  A1C to 7 within the next 90 days  THN Long Term Goal Start Date  03/09/16 [Continue hemoglobin A1C decreased to 8.6% from 9.2%]  Interventions for Problem One Long Term Goal  Reviewed  CHO counting and portion control, Encouraged to watch portions when eating at work and when eating out, Reinforced importance of regular exercise for glycemic control, Encouraged to discuss with MD about going to see an endocrinologist to help her with her diabetes control, Reviewed goals for blood sugars and when to check her blood sugars, Reassigned  EMMI programs and reviewed how to access them    Peter Garter RN, [facility] Care Management Coordinator-Link to [place] Management [phone]

## 2016-03-20 MED FILL — LISINOPRIL 10 MG TABLET: 10 | 90 days supply | Qty: 90 | Fill #0

## 2016-03-20 MED FILL — LANTUS SOLOSTAR 100 UNITS/M: 100 | 30 days supply | Qty: 24 | Fill #9

## 2016-03-31 MED FILL — DESVENLAFAXINE SUC ER 100 M: 100 | 90 days supply | Qty: 90 | Fill #1

## 2016-03-31 MED FILL — SIMVASTATIN 40 MG TABLET: 40 | 90 days supply | Qty: 90 | Fill #1

## 2016-04-01 DIAGNOSIS — R42 Dizziness and giddiness: Secondary | ICD-10-CM | POA: Diagnosis not present

## 2016-04-01 DIAGNOSIS — H8101 Meniere's disease, right ear: Secondary | ICD-10-CM | POA: Diagnosis not present

## 2016-04-09 DIAGNOSIS — F3181 Bipolar II disorder: Secondary | ICD-10-CM | POA: Diagnosis not present

## 2016-04-19 ENCOUNTER — Other Ambulatory Visit: Payer: Self-pay | Admitting: Family Medicine

## 2016-04-19 DIAGNOSIS — IMO0001 Reserved for inherently not codable concepts without codable children: Secondary | ICD-10-CM

## 2016-04-19 DIAGNOSIS — Z794 Long term (current) use of insulin: Principal | ICD-10-CM

## 2016-04-19 DIAGNOSIS — E1165 Type 2 diabetes mellitus with hyperglycemia: Principal | ICD-10-CM

## 2016-04-24 MED FILL — TRUE METRIX GLUCOSE TEST ST: 50 days supply | Qty: 100 | Fill #1

## 2016-04-24 MED FILL — lamoTRIgine 25 MG TABS: 25 | 90 days supply | Qty: 90 | Fill #0

## 2016-04-24 MED FILL — lamoTRIgine 150 MG TABS: 150 | 90 days supply | Qty: 90 | Fill #2

## 2016-04-27 ENCOUNTER — Other Ambulatory Visit (INDEPENDENT_AMBULATORY_CARE_PROVIDER_SITE_OTHER): Payer: 59

## 2016-04-27 DIAGNOSIS — E1165 Type 2 diabetes mellitus with hyperglycemia: Secondary | ICD-10-CM | POA: Diagnosis not present

## 2016-04-27 DIAGNOSIS — Z794 Long term (current) use of insulin: Secondary | ICD-10-CM

## 2016-04-27 DIAGNOSIS — IMO0001 Reserved for inherently not codable concepts without codable children: Secondary | ICD-10-CM

## 2016-04-27 LAB — COMPREHENSIVE METABOLIC PANEL
ALT: 24 U/L (ref 0–35)
AST: 16 U/L (ref 0–37)
Albumin: 4.1 g/dL (ref 3.5–5.2)
Alkaline Phosphatase: 51 U/L (ref 39–117)
BUN: 7 mg/dL (ref 6–23)
CALCIUM: 9.7 mg/dL (ref 8.4–10.5)
CHLORIDE: 105 meq/L (ref 96–112)
CO2: 30 meq/L (ref 19–32)
Creatinine, Ser: 0.58 mg/dL (ref 0.40–1.20)
GFR: 113.64 mL/min (ref >60.00)
Glucose, Bld: 140 mg/dL — ABNORMAL HIGH (ref 70–99)
POTASSIUM: 4.2 meq/L (ref 3.5–5.1)
Sodium: 141 mEq/L (ref 135–145)
Total Bilirubin: 0.4 mg/dL (ref 0.2–1.2)
Total Protein: 7.1 g/dL (ref 6.0–8.3)

## 2016-04-27 LAB — LIPID PANEL
CHOL/HDL RATIO: 4
Cholesterol: 186 mg/dL (ref 0–200)
HDL: 52 mg/dL (ref >39.00)
LDL CALC: 110 mg/dL — AB (ref 0–99)
NonHDL: 133.52
TRIGLYCERIDES: 120 mg/dL (ref 0.0–149.0)
VLDL: 24 mg/dL (ref 0.0–40.0)

## 2016-04-27 LAB — HEMOGLOBIN A1C: Hgb A1c MFr Bld: 8.5 % — ABNORMAL HIGH (ref 4.6–6.5)

## 2016-04-29 DIAGNOSIS — L720 Epidermal cyst: Secondary | ICD-10-CM | POA: Diagnosis not present

## 2016-04-29 DIAGNOSIS — L821 Other seborrheic keratosis: Secondary | ICD-10-CM | POA: Diagnosis not present

## 2016-04-29 DIAGNOSIS — L812 Freckles: Secondary | ICD-10-CM | POA: Diagnosis not present

## 2016-04-29 DIAGNOSIS — L918 Other hypertrophic disorders of the skin: Secondary | ICD-10-CM | POA: Diagnosis not present

## 2016-04-30 ENCOUNTER — Encounter: Payer: Self-pay | Admitting: Family Medicine

## 2016-04-30 MED FILL — LANTUS SOLOSTAR 100 UNITS/M: 100 | 30 days supply | Qty: 24 | Fill #10

## 2016-05-01 ENCOUNTER — Ambulatory Visit: Payer: 59 | Admitting: Family Medicine

## 2016-05-11 ENCOUNTER — Other Ambulatory Visit: Payer: Self-pay | Admitting: Family Medicine

## 2016-05-11 MED FILL — JANUVIA 100 MG TABLET: 100 | 90 days supply | Qty: 90 | Fill #0

## 2016-05-15 ENCOUNTER — Ambulatory Visit (INDEPENDENT_AMBULATORY_CARE_PROVIDER_SITE_OTHER): Payer: 59 | Admitting: Family Medicine

## 2016-05-15 ENCOUNTER — Encounter: Payer: Self-pay | Admitting: Family Medicine

## 2016-05-15 DIAGNOSIS — E1165 Type 2 diabetes mellitus with hyperglycemia: Secondary | ICD-10-CM

## 2016-05-15 DIAGNOSIS — Z794 Long term (current) use of insulin: Secondary | ICD-10-CM

## 2016-05-15 DIAGNOSIS — E785 Hyperlipidemia, unspecified: Secondary | ICD-10-CM | POA: Diagnosis not present

## 2016-05-15 DIAGNOSIS — I10 Essential (primary) hypertension: Secondary | ICD-10-CM | POA: Diagnosis not present

## 2016-05-15 DIAGNOSIS — IMO0001 Reserved for inherently not codable concepts without codable children: Secondary | ICD-10-CM

## 2016-05-15 MED ORDER — METFORMIN HCL 500 MG PO TABS: 500.0000 mg | ORAL_TABLET | Freq: Every day | ORAL | 3 refills | Status: DC

## 2016-05-15 MED FILL — metFORMIN HCL 500 MG TABS: 500 | 90 days supply | Qty: 90 | Fill #0

## 2016-05-15 NOTE — Patient Instructions (Addendum)
Call about an eye exam.   Try taking a metformin midday to try to limit sugar spikes later in the day.  Recheck in about 3 months.   Take care.  Glad to see you.  Update me as needed.

## 2016-05-15 NOTE — Progress Notes (Signed)
Diabetes:  Using medications without difficulties:yes Hypoglycemic episodes:rarely down to 70s but not lower.  Hyperglycemic episodes:no Feet problems:no Blood Sugars averaging: usually ~70-90s in the AM, after sleeping.  Can get up to 200+ later in the day.  eye exam within last year: due, d/w pt.   A1c d/w pt.  Not controlled, similar to prev.   She was open to retrial of metformin.  D/w pt.    Elevated Cholesterol: Using medications without problems:yes Muscle aches: not from statin, but has "normal 55 year old aches" Diet compliance:encouraged Exercise:encouraged Labs d/w pt.    Hypertension:    Using medication without problems or lightheadedness: yes Chest pain with exertion:no Edema:no Short of breath:likely only weight related.    She saw Dr. Allyson Sabal re: routine skin check.  That was normal/reassuring per patient.   She saw Dr. Lucia Gaskins with ENT.  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  She is considering a second opinion for later on and she'll update me about getting that scheduled later on.    PMH and SH reviewed  ROS: Per HPI unless specifically indicated in ROS section   Meds, vitals, and allergies reviewed.   GEN: nad, alert and oriented HEENT: mucous membranes moist NECK: supple w/o LA CV: rrr. PULM: ctab, no inc wob ABD: soft, +bs EXT: no edema SKIN: no acute rash

## 2016-05-15 NOTE — Progress Notes (Signed)
Pre visit review using our clinic review tool, if applicable. No additional management support is needed unless otherwise documented below in the visit note. 

## 2016-05-16 ENCOUNTER — Encounter: Payer: Self-pay | Admitting: Family Medicine

## 2016-05-16 DIAGNOSIS — E785 Hyperlipidemia, unspecified: Secondary | ICD-10-CM | POA: Insufficient documentation

## 2016-05-16 NOTE — Assessment & Plan Note (Signed)
Controlled. Continue work on diet and exercise. Needs weight reduction. No change in medication. She agrees.

## 2016-05-16 NOTE — Assessment & Plan Note (Signed)
Not worse. Not yet at goal. Not controlled. Continue work on diet and exercise. Continue work on weight loss. Discussed with patient about retrial of low dose metformin to see if she can tolerate it. Recheck in about 3 months, labs before visit. She agrees. Recent labs discussed with patient.

## 2016-06-04 ENCOUNTER — Other Ambulatory Visit: Payer: Self-pay | Admitting: Family Medicine

## 2016-06-04 MED FILL — UNIFINE PENTIPS 31GX3/16": 31G X 5 MM | 90 days supply | Qty: 100 | Fill #0

## 2016-06-04 MED FILL — UNIFINE PENTIPS 31GX3/16: 31G X 5 MM | 90 days supply | Qty: 100 | Fill #0

## 2016-06-04 MED FILL — LANTUS SOLOSTAR 100 UNITS/M: 100 | 30 days supply | Qty: 24 | Fill #11

## 2016-06-05 ENCOUNTER — Ambulatory Visit (INDEPENDENT_AMBULATORY_CARE_PROVIDER_SITE_OTHER): Payer: 59 | Admitting: Internal Medicine

## 2016-06-05 ENCOUNTER — Encounter: Payer: Self-pay | Admitting: Internal Medicine

## 2016-06-05 VITALS — BP 122/86 | HR 110 | Temp 98.3°F | Wt 288.0 lb

## 2016-06-05 DIAGNOSIS — L723 Sebaceous cyst: Secondary | ICD-10-CM | POA: Diagnosis not present

## 2016-06-05 DIAGNOSIS — L089 Local infection of the skin and subcutaneous tissue, unspecified: Secondary | ICD-10-CM

## 2016-06-05 MED ORDER — CEPHALEXIN 500 MG PO CAPS: 500.0000 mg | ORAL_CAPSULE | Freq: Three times a day (TID) | ORAL | 1 refills | Status: DC

## 2016-06-05 NOTE — Progress Notes (Signed)
Subjective:    Patient ID: Sarah Walls, female    DOB: 10-Sep-1960, 55 y.o.   MRN: [id number]  HPI Here due to recurrent skin problem on back  Has had areas of lesions on back that get "junk" in it Will have drainage at times Goes back a year or more Now has a new place-- red, tender, husband thinks it is infected  Current Outpatient Prescriptions on File Prior to Visit  Medication Sig Dispense Refill  . ALPRAZolam (XANAX) 0.5 MG tablet Take 0.5 mg by mouth at bedtime as needed (for panic attacks.). May take up to 4 tablets per day as needed    . aspirin 81 MG tablet Take 81 mg by mouth daily.    . chlorhexidine (PERIDEX) 0.12 % solution   3  . Cholecalciferol (VITAMIN D) 2000 units CAPS Take 1 capsule (2,000 Units total) by mouth daily. 30 capsule   . desvenlafaxine (PRISTIQ) 50 MG 24 hr tablet Take 100 mg by mouth daily.     . Insulin Glargine (LANTUS SOLOSTAR) 100 UNIT/ML Solostar Pen Inject 75 Units into the skin daily at 10 pm. If AM sugar >120, add 1 unit per day.  If <80, decrease 1 unit    . JANUVIA 100 MG tablet TAKE 1 TABLET BY MOUTH ONCE DAILY 90 tablet 0  . lamoTRIgine (LAMICTAL) 150 MG tablet Take 150 mg by mouth daily.    Marland Kitchen lamoTRIgine (LAMICTAL) 25 MG tablet Take 25 mg by mouth as needed.    Elmore Guise Device MISC Use to test blood sugar once or twice daily.  Diagnosis E11.65  Insulin dependent.  True Metrix 1 each 0  . Lancets MISC Use as directed to test blood sugar one or two times daily.  Diagnosis E11.65  Insulin dependent.  True Metrix 100 each 11  . lisinopril (PRINIVIL,ZESTRIL) 10 MG tablet TAKE 1 TABLET BY MOUTH ONCE DAILY 90 tablet 0  . metFORMIN (GLUCOPHAGE) 500 MG tablet Take 1 tablet (500 mg total) by mouth daily. 90 tablet 3  . Multiple Vitamin (MULTIVITAMIN) tablet Take 1 tablet by mouth daily.      . simvastatin (ZOCOR) 40 MG tablet TAKE 1 TABLET BY MOUTH AT BEDTIME 90 tablet 1  . TRUE METRIX BLOOD GLUCOSE TEST test strip USE AS INSTRUCTED TO TEST BLOOD SUGAR  ONE OR TWO TIMES DAILY 100 each 11  . UNIFINE PENTIPS 31G X 5 MM MISC USE DAILY WITH INSULIN PEN 100 each 4   No current facility-administered medications on file prior to visit.     Allergies  Allergen Reactions  . Beta Adrenergic Blockers Other (See Comments)    Drowsy, fatigue    Past Medical History:  Diagnosis Date  . Arthritis   . Bipolar disorder ([place])   . Cataract   . Depression    BAD2 - Dr. Albertine Patricia  . Diabetes mellitus   . Hyperlipidemia   . Hypertension   . Panic attacks    BAD2 - Dr. Albertine Patricia  . Personal history of colonic polyps - adenomas 10/13/2013   12/2013 - 6 adenomas max 12 mm - repeat colonoscopy  2018     Past Surgical History:  Procedure Laterality Date  . CATARACT EXTRACTION Bilateral 2015  . CESAREAN SECTION  1990   Fetal Distress  . EYE SURGERY    . TONSILLECTOMY  1969    Family History  Problem Relation Age of Onset  . Cancer Mother     Metastatic liver CA  . Thyroid disease  Mother     Hypothyroidism  . Vision loss Mother     Eye tumor, removed orbit at the end of 2004  . Melanoma Mother   . Diabetes Mother   . Diabetes Father   . Heart disease Father     CABG, coronary disease  . Thyroid disease Sister     Hypothyroidism  . Colon cancer Maternal Aunt 71  . Breast cancer Neg Hx     Social History   Social History  . Marital status: Married    Spouse name: N/A  . Number of children: 1  . Years of education: N/A   Occupational History  . [employer]  Unemployed   Social History Main Topics  . Smoking status: Never Smoker  . Smokeless tobacco: Never Used  . Alcohol use No  . Drug use: No  . Sexual activity: Not Currently    Birth control/ protection: None   Other Topics Concern  . Not on file   Social History Narrative   05/2009 Masters in counseling in disabled.   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       Mother lived in home.  (Deceased quickly Spring 09 from  metastatic melanoma)   Review of Systems  No fever Feels slightly "under the weather"---like achy     Objective:   Physical Exam  Skin:  Cyst on mid back --inflamed but not fluctuant. Fairly deep At least 1 other cyst--smaller and not infected (higher up)          Assessment & Plan:

## 2016-06-05 NOTE — Patient Instructions (Signed)
Please try warm compresses on the cyst to see if it will drain. If it gets worse, start the antibiotic.

## 2016-06-05 NOTE — Assessment & Plan Note (Signed)
Mildly inflamed Discussed warm compresses Will give Rx for cephalexin to fill if worsens

## 2016-06-05 NOTE — Progress Notes (Signed)
Pre visit review using our clinic review tool, if applicable. No additional management support is needed unless otherwise documented below in the visit note. 

## 2016-06-09 ENCOUNTER — Ambulatory Visit: Payer: Self-pay

## 2016-07-07 ENCOUNTER — Ambulatory Visit (INDEPENDENT_AMBULATORY_CARE_PROVIDER_SITE_OTHER): Payer: 59 | Admitting: Family Medicine

## 2016-07-07 ENCOUNTER — Encounter: Payer: Self-pay | Admitting: Family Medicine

## 2016-07-07 VITALS — BP 122/82 | HR 112 | Temp 98.5°F | Wt 283.0 lb

## 2016-07-07 DIAGNOSIS — R197 Diarrhea, unspecified: Secondary | ICD-10-CM

## 2016-07-07 DIAGNOSIS — K529 Noninfective gastroenteritis and colitis, unspecified: Secondary | ICD-10-CM | POA: Diagnosis not present

## 2016-07-07 DIAGNOSIS — R42 Dizziness and giddiness: Secondary | ICD-10-CM

## 2016-07-07 LAB — COMPREHENSIVE METABOLIC PANEL
ALBUMIN: 4.5 g/dL (ref 3.5–5.2)
ALK PHOS: 54 U/L (ref 39–117)
ALT: 37 U/L — ABNORMAL HIGH (ref 0–35)
AST: 31 U/L (ref 0–37)
BUN: 10 mg/dL (ref 6–23)
CO2: 29 mEq/L (ref 19–32)
Calcium: 9.8 mg/dL (ref 8.4–10.5)
Chloride: 104 mEq/L (ref 96–112)
Creatinine, Ser: 0.64 mg/dL (ref 0.40–1.20)
GFR: 101.37 mL/min (ref >60.00)
Glucose, Bld: 180 mg/dL — ABNORMAL HIGH (ref 70–99)
POTASSIUM: 3.9 meq/L (ref 3.5–5.1)
Sodium: 140 mEq/L (ref 135–145)
TOTAL PROTEIN: 7.8 g/dL (ref 6.0–8.3)
Total Bilirubin: 0.4 mg/dL (ref 0.2–1.2)

## 2016-07-07 NOTE — Progress Notes (Signed)
Pre visit review using our clinic review tool, if applicable. No additional management support is needed unless otherwise documented below in the visit note. 

## 2016-07-07 NOTE — Patient Instructions (Addendum)
Presumed norovirus.   Rest and fluids.  Go to the lab on the way out.  We'll contact you with your lab report. Rosaria Ferries will call about your referral. Please call in.  Thanks.

## 2016-07-07 NOTE — Progress Notes (Signed)
Had been off oral meds for a few days but still taking insulin.    Sx started with vertigo PM on 07/02/16.  The room was spinning.  Husband had to come pick her up at work.  Didn't have diarrhea at that point- diarrhea started Friday.  Also had nausea and vomiting.  Last vomited 07/03/16.  Last diarrhea was this AM.  Diarrhea is slowly getting better.  She feels likely she isn't back to baseline at this point, still diffusely weak.   Isn't nauseated at this point.  Sugar has been up and down in the meantime, with more variation as expected with recent illness, d/w pt.    She has had episodes of vertigo in the past, but usually doesn't have diarrhea with prev episodes.    Meds, vitals, and allergies reviewed.   ROS: Per HPI unless specifically indicated in ROS section   GEN: nad, alert and oriented HEENT: mucous membranes moist NECK: supple w/o LA CV: rrr.  no murmur PULM: ctab, no inc wob ABD: soft, +bs, minimally ttp in the B lower abd EXT: no edema

## 2016-07-08 ENCOUNTER — Encounter: Payer: Self-pay | Admitting: *Deleted

## 2016-07-08 DIAGNOSIS — K529 Noninfective gastroenteritis and colitis, unspecified: Secondary | ICD-10-CM | POA: Insufficient documentation

## 2016-07-08 NOTE — Assessment & Plan Note (Signed)
May not have been related, but is recurrent.  Refer.  She agrees.

## 2016-07-08 NOTE — Assessment & Plan Note (Signed)
Presumed norovirus.   Rest and fluids.  D/w pt about dx and path/phys.  Work note given.  Restart oral meds when possible.   Okay for outpatient f/u.  See notes on labs.

## 2016-07-09 DIAGNOSIS — F3181 Bipolar II disorder: Secondary | ICD-10-CM | POA: Diagnosis not present

## 2016-07-09 DIAGNOSIS — F332 Major depressive disorder, recurrent severe without psychotic features: Secondary | ICD-10-CM | POA: Diagnosis not present

## 2016-07-10 MED FILL — ALPRAZolam 1 MG TABS: 1 | 30 days supply | Qty: 90 | Fill #0

## 2016-07-13 ENCOUNTER — Other Ambulatory Visit: Payer: Self-pay | Admitting: Family Medicine

## 2016-07-13 NOTE — Telephone Encounter (Signed)
Sent. Thanks.   

## 2016-07-13 NOTE — Telephone Encounter (Signed)
Received refill electronically Last office visit 07/07/16 Refill request does not match medication list.

## 2016-07-14 MED FILL — LANTUS SOLOSTAR 100 UNITS/M: 100 | 27 days supply | Qty: 21 | Fill #0

## 2016-07-15 DIAGNOSIS — H8102 Meniere's disease, left ear: Secondary | ICD-10-CM | POA: Diagnosis not present

## 2016-07-15 DIAGNOSIS — R42 Dizziness and giddiness: Secondary | ICD-10-CM | POA: Diagnosis not present

## 2016-07-15 DIAGNOSIS — H9042 Sensorineural hearing loss, unilateral, left ear, with unrestricted hearing on the contralateral side: Secondary | ICD-10-CM | POA: Diagnosis not present

## 2016-07-15 DIAGNOSIS — H903 Sensorineural hearing loss, bilateral: Secondary | ICD-10-CM | POA: Diagnosis not present

## 2016-07-16 MED FILL — CHLORHEXIDINE 0.12% RINSE: 0.12 | 15 days supply | Qty: 473 | Fill #2

## 2016-08-07 ENCOUNTER — Encounter: Payer: Self-pay | Admitting: Family Medicine

## 2016-08-07 ENCOUNTER — Other Ambulatory Visit: Payer: Self-pay | Admitting: Family Medicine

## 2016-08-07 MED ORDER — SIMVASTATIN 40 MG PO TABS: 40.0000 mg | ORAL_TABLET | Freq: Every day | ORAL | 2 refills | Status: DC

## 2016-08-07 MED ORDER — INSULIN GLARGINE 100 UNIT/ML SOLOSTAR PEN: 75.0000 [IU] | PEN_INJECTOR | Freq: Every day | SUBCUTANEOUS | 5 refills | Status: DC

## 2016-08-07 MED ORDER — LISINOPRIL 10 MG PO TABS: 10.0000 mg | ORAL_TABLET | Freq: Every day | ORAL | 1 refills | Status: DC

## 2016-08-07 MED FILL — SIMVASTATIN 40 MG TABLET: 40 | 90 days supply | Qty: 90 | Fill #0

## 2016-08-07 MED FILL — lamoTRIgine 25 MG TABS: 25 | 90 days supply | Qty: 90 | Fill #1

## 2016-08-07 MED FILL — LISINOPRIL 10 MG TABLET: 10 | 90 days supply | Qty: 90 | Fill #0

## 2016-08-07 MED FILL — LANTUS SOLOSTAR 100 UNITS/M: 100 | 27 days supply | Qty: 21 | Fill #1

## 2016-08-09 ENCOUNTER — Other Ambulatory Visit: Payer: Self-pay | Admitting: Family Medicine

## 2016-08-09 DIAGNOSIS — IMO0001 Reserved for inherently not codable concepts without codable children: Secondary | ICD-10-CM

## 2016-08-09 DIAGNOSIS — Z794 Long term (current) use of insulin: Principal | ICD-10-CM

## 2016-08-09 DIAGNOSIS — E1165 Type 2 diabetes mellitus with hyperglycemia: Principal | ICD-10-CM

## 2016-08-10 ENCOUNTER — Other Ambulatory Visit: Payer: 59

## 2016-08-14 ENCOUNTER — Ambulatory Visit: Payer: 59 | Admitting: Family Medicine

## 2016-08-18 MED FILL — TRAMADOL-ACETAMINOPHN 37.5-: 37.5-325 | 2 days supply | Qty: 16 | Fill #0

## 2016-08-18 MED FILL — AMOXICILLIN 500 MG CAPSULE: 500 | 6 days supply | Qty: 25 | Fill #0

## 2016-09-07 MED FILL — DESVENLAFAXINE SUC ER 100 M: 100 | 90 days supply | Qty: 90 | Fill #0

## 2016-09-09 ENCOUNTER — Other Ambulatory Visit: Payer: Self-pay | Admitting: Family Medicine

## 2016-09-09 ENCOUNTER — Encounter: Payer: Self-pay | Admitting: Family Medicine

## 2016-09-09 MED ORDER — SITAGLIPTIN PHOSPHATE 100 MG PO TABS: 100.0000 mg | ORAL_TABLET | Freq: Every day | ORAL | 1 refills | Status: DC

## 2016-09-09 MED FILL — JANUVIA 100 MG TABLET: 100 | 90 days supply | Qty: 90 | Fill #0

## 2016-09-09 MED FILL — metFORMIN HCL 500 MG TABS: 500 | 90 days supply | Qty: 90 | Fill #1

## 2016-09-14 ENCOUNTER — Encounter: Payer: Self-pay | Admitting: Family Medicine

## 2016-09-14 ENCOUNTER — Other Ambulatory Visit: Payer: 59

## 2016-09-15 ENCOUNTER — Other Ambulatory Visit (INDEPENDENT_AMBULATORY_CARE_PROVIDER_SITE_OTHER): Payer: 59

## 2016-09-15 DIAGNOSIS — IMO0001 Reserved for inherently not codable concepts without codable children: Secondary | ICD-10-CM

## 2016-09-15 DIAGNOSIS — E1165 Type 2 diabetes mellitus with hyperglycemia: Secondary | ICD-10-CM

## 2016-09-15 DIAGNOSIS — Z794 Long term (current) use of insulin: Secondary | ICD-10-CM | POA: Diagnosis not present

## 2016-09-15 LAB — HEMOGLOBIN A1C: HEMOGLOBIN A1C: 9.2 % — AB (ref 4.6–6.5)

## 2016-09-17 ENCOUNTER — Encounter: Payer: Self-pay | Admitting: Family Medicine

## 2016-09-17 ENCOUNTER — Ambulatory Visit (INDEPENDENT_AMBULATORY_CARE_PROVIDER_SITE_OTHER): Payer: 59 | Admitting: Family Medicine

## 2016-09-17 VITALS — BP 134/72 | HR 107 | Temp 98.6°F | Wt 287.8 lb

## 2016-09-17 DIAGNOSIS — E1165 Type 2 diabetes mellitus with hyperglycemia: Secondary | ICD-10-CM | POA: Diagnosis not present

## 2016-09-17 DIAGNOSIS — E119 Type 2 diabetes mellitus without complications: Secondary | ICD-10-CM

## 2016-09-17 DIAGNOSIS — Z794 Long term (current) use of insulin: Secondary | ICD-10-CM | POA: Diagnosis not present

## 2016-09-17 DIAGNOSIS — IMO0001 Reserved for inherently not codable concepts without codable children: Secondary | ICD-10-CM

## 2016-09-17 NOTE — Progress Notes (Signed)
Pre visit review using our clinic review tool, if applicable. No additional management support is needed unless otherwise documented below in the visit note. 

## 2016-09-17 NOTE — Progress Notes (Signed)
She admits to fatigue.  Rare use of BZD.  Still on baseline meds.  She is working her night shift and that is difficult.    Diabetes:  Using medications without difficulties:yes Hypoglycemic episodes: rare, one episode in the last month, down to 70s Hyperglycemic episodes: no Feet problems: no Blood Sugars averaging: up to 300 occ, usually with diet/schedule changes.   eye exam within last year:due, d/w pt.   She is trying to avoid cards in her diet.   A1c up, d/w pt.   She had been working on a meal replacement protein drink.    She still taking insulin from 5-7PM, regardless of her work shift.    She isn't having manic symptoms. She admits to ongoing stressors with her work shift, home situation (her child has chronic disabilities), and her medical conditions including diabetes. Discussed with patient. Offered support at the office visit. Per patient there was not a medical intervention at this point that I could provide that would help her situation. She is trying to work through her situation.  Meds, vitals, and allergies reviewed.   ROS: Per HPI unless specifically indicated in ROS section   GEN: nad, alert and oriented HEENT: mucous membranes moist NECK: supple w/o LA CV: rrr. PULM: ctab, no inc wob EXT: no edema

## 2016-09-17 NOTE — Patient Instructions (Addendum)
I will check on the phone system problems.  I don't want this to be a problem for your or any other patient, now or in the future.  I apologize for that on behalf of the clinic. Sarah Walls will call about your referral. Don't change your meds for now.  Take care.  Glad to see you.

## 2016-09-18 MED FILL — LANTUS SOLOSTAR 100 UNITS/M: 100 | 27 days supply | Qty: 21 | Fill #2

## 2016-09-18 NOTE — Assessment & Plan Note (Signed)
Discussed with patient about options. Her A1c had been improving over the last several checks but is worse this time. She is a difficult situation, functionally on a swing shift. Discussed with her about getting in 3 months to work on diet and exercise and not change medications or refer to endocrine. She opted for endocrine referral. Ordered. At this point we didn't change her medications. Continue as is for now. She agrees.  There was an incident at check in today. She was raising her voice and was reportedly rude to check in staff.  This was brought to my attention by check in staff and other staff members elsewhere in the clinic who noticed it.  I didn't directly witness the event, but all of the details reported by staff were similar.  Discussed with patient. She can report what her complaint she has to me, but I advised her it was not reasonable to be rude to check in staff. I asked her to apologize on the way out.  She reported multiple stressors, see above. She also reports difficulty with our phone system in the clinic earlier this week. I was not aware of recent phone issues in the clinic. I will check with out office manager about this. I did apologize on behalf of the clinic about the phone difficulty she had earlier this week.  Either way this does not make her behavior check in acceptable. Discussed with patient.

## 2016-10-02 MED FILL — lamoTRIgine 150 MG TABS: 150 | 90 days supply | Qty: 90 | Fill #0

## 2016-10-02 MED FILL — UNIFINE PENTIPS 31GX3/16: 31G X 5 MM | 90 days supply | Qty: 100 | Fill #1

## 2016-10-02 MED FILL — UNIFINE PENTIPS 31GX3/16": 31G X 5 MM | 90 days supply | Qty: 100 | Fill #1

## 2016-10-15 DIAGNOSIS — F3181 Bipolar II disorder: Secondary | ICD-10-CM | POA: Diagnosis not present

## 2016-10-15 DIAGNOSIS — F3341 Major depressive disorder, recurrent, in partial remission: Secondary | ICD-10-CM | POA: Diagnosis not present

## 2016-10-15 MED FILL — LANTUS SOLOSTAR 100 UNITS/M: 100 | 27 days supply | Qty: 21 | Fill #3

## 2016-10-26 ENCOUNTER — Telehealth: Payer: Self-pay

## 2016-10-26 MED ORDER — GLUCOSE BLOOD VI STRP: ORAL_STRIP | 1 refills | Status: DC

## 2016-10-26 MED ORDER — ACCU-CHEK FASTCLIX LANCETS MISC: 1 refills | Status: DC

## 2016-10-26 NOTE — Telephone Encounter (Signed)
Pt has new accu ck meter and request accuchek guide strips and fastclix lancets. Refilled per protocol to [facility]. Pt voiced understanding and will ck with pharmacy.

## 2016-11-14 MED FILL — SIMVASTATIN 40 MG TABLET: 40 | 90 days supply | Qty: 90 | Fill #1

## 2016-11-14 MED FILL — LANTUS SOLOSTAR 100 UNITS/M: 100 | 27 days supply | Qty: 21 | Fill #4

## 2016-11-16 MED FILL — LISINOPRIL 10 MG TABLET: 10 | 90 days supply | Qty: 90 | Fill #1

## 2016-11-23 MED FILL — ACCU-CHEK GUIDE TEST STRIP: 90 days supply | Qty: 200 | Fill #0

## 2016-11-27 ENCOUNTER — Ambulatory Visit (INDEPENDENT_AMBULATORY_CARE_PROVIDER_SITE_OTHER): Payer: 59 | Admitting: Internal Medicine

## 2016-11-27 ENCOUNTER — Encounter: Payer: Self-pay | Admitting: Internal Medicine

## 2016-11-27 VITALS — BP 140/88 | HR 119 | Wt 282.0 lb

## 2016-11-27 DIAGNOSIS — Z794 Long term (current) use of insulin: Secondary | ICD-10-CM

## 2016-11-27 DIAGNOSIS — E113299 Type 2 diabetes mellitus with mild nonproliferative diabetic retinopathy without macular edema, unspecified eye: Secondary | ICD-10-CM

## 2016-11-27 DIAGNOSIS — IMO0001 Reserved for inherently not codable concepts without codable children: Secondary | ICD-10-CM

## 2016-11-27 DIAGNOSIS — E1165 Type 2 diabetes mellitus with hyperglycemia: Secondary | ICD-10-CM | POA: Diagnosis not present

## 2016-11-27 LAB — POCT GLYCOSYLATED HEMOGLOBIN (HGB A1C): Hemoglobin A1C: 8.8

## 2016-11-27 MED ORDER — INSULIN DEGLUDEC 200 UNIT/ML ~~LOC~~ SOPN: 74.0000 [IU] | PEN_INJECTOR | Freq: Every day | SUBCUTANEOUS | 11 refills | Status: DC

## 2016-11-27 MED ORDER — METFORMIN HCL ER 500 MG PO TB24: 1000.0000 mg | ORAL_TABLET | Freq: Two times a day (BID) | ORAL | 3 refills | Status: DC

## 2016-11-27 MED FILL — METFORMIN HCL ER 500 MG TAB: 500 | 90 days supply | Qty: 360 | Fill #0

## 2016-11-27 MED FILL — TRESIBA FLEXTOUCH 200 UNITS: 200 | 24 days supply | Qty: 9 | Fill #0

## 2016-11-27 NOTE — Progress Notes (Signed)
Patient ID: Sarah Walls, female   DOB: August 13, 1960, 56 y.o.   MRN: [id number]   HPI: Sarah Walls is a 56 y.o.-year-old female, referred by her PCP, Dr. 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  Last hemoglobin A1c was: Lab Results  Component Value Date   HGBA1C 9.2 (H) 09/15/2016   HGBA1C 8.5 (H) 04/27/2016   HGBA1C 8.6 (H) 01/27/2016   HGBA1C 9.2 (H) 10/04/2015   HGBA1C 10.0 (H) 06/28/2015   HGBA1C 7.9 (H) 03/11/2015   HGBA1C 10.0 (H) 10/31/2014   HGBA1C 10.2 (H) 02/07/2014   HGBA1C 9.5 (H) 10/10/2013   HGBA1C 9.3 (H) 03/15/2013   Pt is on a regimen of: - Metformin 500 mg 1x a day, with dinner. Higher doses >> diarrhea (she has IBS). - Januvia 100 mg daily with dinner - Lantus 75 units at bedtime - in the evening Did not try other meds.  She is in the [place] pgm.  Pt checks her sugars 1x a day and they are: - am: 90-120 when works; 125-231, occas. >300 - not in months - 2h after b'fast: n/c - before lunch: n/c - 2h after lunch: n/c - before dinner: n/c - 2h after dinner: n/c - bedtime: n/c - nighttime: n/c No lows. Lowest sugar was 85; she has hypoglycemia awareness at 70.  Highest sugar was >300.  Glucometer: AccuChek  Pt's meals are: - Breakfast: b'fast sandwich + yoghurt; boiled eggs + slice of bacon + yoghurt - Lunch: frozen meal +/- salad - Dinner: frozen meals - Snacks: zucchini bread, apple  She works night shift - 12h 3x a week.  - no CKD, last BUN/creatinine:  Lab Results  Component Value Date   BUN 10 07/07/2016   BUN 7 04/27/2016   CREATININE 0.64 07/07/2016   CREATININE 0.58 04/27/2016  On Lisinopril. - last set of lipids: Lab Results  Component Value Date   CHOL 186 04/27/2016   HDL 52.00 04/27/2016   LDLCALC 110 (H) 04/27/2016   LDLDIRECT 140.1 10/06/2011   TRIG 120.0 04/27/2016   CHOLHDL 4 04/27/2016  On Zocor 40. - last eye exam was in 2017. + mild DR. In  2016 >> cataract sx. - no numbness and tingling in her feet.  Pt has FH of DM in M, F, PGM.  ROS: Constitutional: + weight loss, + fatigue, + subjective hyperthermia Eyes: + blurry vision, no xerophthalmia ENT: no sore throat, no nodules palpated in throat, no dysphagia/odynophagia, no hoarseness Cardiovascular: + CP (had investig. >> mm-skeletal)/+ SOB/no palpitations/leg swelling Respiratory: no cough/+ SOB Gastrointestinal: no N/V/+ D/no C Musculoskeletal: no muscle/+ joint aches Skin: no rashes Neurological: no tremors/numbness/tingling/dizziness Psychiatric: + both:depression/anxiety  Past Medical History:  Diagnosis Date  . Arthritis   . Bipolar disorder ([place])   . Cataract   . Depression    BAD2 - Dr. Albertine Patricia  . Diabetes mellitus   . Hyperlipidemia   . Hypertension   . Panic attacks    BAD2 - Dr. Albertine Patricia  . Personal history of colonic polyps - adenomas 10/13/2013   12/2013 - 6 adenomas max 12 mm - repeat colonoscopy  2018    Past Surgical History:  Procedure Laterality Date  . CATARACT EXTRACTION Bilateral 2015  . CESAREAN SECTION  1990   Fetal Distress  . EYE SURGERY    . TONSILLECTOMY  1969   Social History   Social History  . Marital status: Married    Spouse name: N/A  .  Number of children: 1   Occupational History  . 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    Social History Main Topics  . Smoking status: Never Smoker  . Smokeless tobacco: Never Used  . Alcohol use No  . Drug use: No   Social History Narrative   05/2009 Masters in counseling in disabled.   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       Mother lived in home.  (Deceased quickly Spring 09 from metastatic melanoma)   Current Outpatient Prescriptions on File Prior to Visit  Medication Sig Dispense Refill  . ACCU-CHEK FASTCLIX LANCETS MISC Check blood 1 - 2 times daily and as directed. Dx E11.65 200 each 1  . ALPRAZolam  (XANAX) 0.5 MG tablet Take 0.5 mg by mouth at bedtime as needed (for panic attacks.). May take up to 4 tablets per day as needed    . aspirin 81 MG tablet Take 81 mg by mouth daily.    . chlorhexidine (PERIDEX) 0.12 % solution   3  . Cholecalciferol (VITAMIN D) 2000 units CAPS Take 1 capsule (2,000 Units total) by mouth daily. 30 capsule   . desvenlafaxine (PRISTIQ) 100 MG 24 hr tablet Take 100 mg by mouth daily.    Marland Kitchen glucose blood (ACCU-CHEK GUIDE) test strip Check blood 1 - 2 times daily and as directed. Dx E11.65 200 each 1  . Insulin Glargine (LANTUS SOLOSTAR) 100 UNIT/ML Solostar Pen Inject 75 Units into the skin daily at 10 pm. If AM sugar >120, add 1 unit per day.  If <80, decrease 1 unit 15 mL 5  . lamoTRIgine (LAMICTAL) 150 MG tablet Take 150 mg by mouth daily.    Marland Kitchen lamoTRIgine (LAMICTAL) 25 MG tablet Take 25 mg by mouth as needed.    Elmore Guise Device MISC Use to test blood sugar once or twice daily.  Diagnosis E11.65  Insulin dependent.  True Metrix 1 each 0  . lisinopril (PRINIVIL,ZESTRIL) 10 MG tablet Take 1 tablet (10 mg total) by mouth daily. 90 tablet 1  . metFORMIN (GLUCOPHAGE) 500 MG tablet Take 1 tablet (500 mg total) by mouth daily. 90 tablet 3  . Multiple Vitamin (MULTIVITAMIN) tablet Take 1 tablet by mouth daily.      . simvastatin (ZOCOR) 40 MG tablet Take 1 tablet (40 mg total) by mouth at bedtime. 90 tablet 2  . sitaGLIPtin (JANUVIA) 100 MG tablet Take 1 tablet (100 mg total) by mouth daily. 90 tablet 1  . UNIFINE PENTIPS 31G X 5 MM MISC USE DAILY WITH INSULIN PEN 100 each 4   No current facility-administered medications on file prior to visit.    Allergies  Allergen Reactions  . Beta Adrenergic Blockers Other (See Comments)    Drowsy, fatigue   Family History  Problem Relation Age of Onset  . Cancer Mother        Metastatic liver CA  . Thyroid disease Mother        Hypothyroidism  . Vision loss Mother        Eye tumor, removed orbit at the end of 2004  .  Melanoma Mother   . Diabetes Mother   . Diabetes Father   . Heart disease Father        CABG, coronary disease  . Thyroid disease Sister        Hypothyroidism  . Colon cancer Maternal Aunt 60  . Breast cancer Neg Hx    PE: BP 140/88 (  BP Location: Left Arm, Patient Position: Sitting)   Pulse (!) 119   Wt 282 lb (127.9 kg)   LMP 08/28/2015 (Approximate)   SpO2 96%   BMI 44.17 kg/m  Wt Readings from Last 3 Encounters:  11/27/16 282 lb (127.9 kg)  09/17/16 287 lb 12 oz (130.5 kg)  07/07/16 283 lb (128.4 kg)   Constitutional: obese, in NAD Eyes: PERRLA, EOMI, no exophthalmos ENT: moist mucous membranes, no thyromegaly, no cervical lymphadenopathy Cardiovascular: tachycardia, RR, No MRG Respiratory: CTA B Gastrointestinal: abdomen soft, NT, ND, BS+ Musculoskeletal: no deformities, strength intact in all 4 Skin: moist, warm, no rashes Neurological: no tremor with outstretched hands, DTR normal in all 4  ASSESSMENT: 1. DM2, insulin-dependent, uncontrolled, with complications - mild DR  PLAN:  1. Patient with long-standing, uncontrolled diabetes, on oral antidiabetic regimen + basal insulin, which became insufficient. She is trying to adjust her diet to contain as little carbs as possible, on the expense of adding fats. We discussed about the concept of insulin resistance and why she should work on reducing fats and adding starches back. - since she is working 3 nights a week >> I recommended Tyler Aas instead of lantus as this has a longer t1/2 and better for shift work. Will use the U200 version since she uses a larger volume. - We will also try to increase Metformin but in the ER form as she had diarrhea in the past with reg. Metformin - we also discussed about possibly adding an SGLT2 inh or a GLP1 R agonist at next visit, depending on the sugars - I suggested to:  Patient Instructions  Please change: - Lantus to [place] 74 units and take it at bedtime  Change Metformin to  Metformin ER and increase the dose as tolerated: add another Metformin ER tablet (500 mg) with breakfast x 4 days. If you tolerate this well, add another metformin tablet with dinner (total 1000 mg) x 4 days. If you tolerate this well, add another metformin tablet with breakfast (total 1000 mg). Continue with 1000 mg of metformin 2x a day with breakfast and dinner.  Continue: - Januvia 100 mg before b'fast  Please let me know if the sugars are consistently <80 or >200.  Please return in 1.5-2 months with your sugar log.   - Strongly advised her to start checking sugars at different times of the day - check 1-2 times a day, rotating checks - given sugar log and advised how to fill it and to bring it at next appt  - given foot care handout and explained the principles  - given instructions for hypoglycemia management "15-15 rule"  - advised for yearly eye exams  - Return to clinic in 1.5-2 mo with sugar log   Philemon Kingdom, MD PhD [facility] Endocrinology

## 2016-11-27 NOTE — Patient Instructions (Addendum)
Please change: - Lantus to [place] 74 units and take it at bedtime  Change Metformin to Metformin ER and increase the dose as tolerated: add another Metformin ER tablet (500 mg) with breakfast x 4 days. If you tolerate this well, add another metformin tablet with dinner (total 1000 mg) x 4 days. If you tolerate this well, add another metformin tablet with breakfast (total 1000 mg). Continue with 1000 mg of metformin 2x a day with breakfast and dinner.  Continue: - Januvia 100 mg before b'fast  Please let me know if the sugars are consistently <80 or >200.  Please return in 1.5-2 months with your sugar log.   PATIENT INSTRUCTIONS FOR TYPE 2 DIABETES:  **Please join MyChart!** - see attached instructions about how to join if you have not done so already.  DIET AND EXERCISE Diet and exercise is an important part of diabetic treatment.  We recommended aerobic exercise in the form of brisk walking (working between 40-60% of maximal aerobic capacity, similar to brisk walking) for 150 minutes per week (such as 30 minutes five days per week) along with 3 times per week performing 'resistance' training (using various gauge rubber tubes with handles) 5-10 exercises involving the major muscle groups (upper body, lower body and core) performing 10-15 repetitions (or near fatigue) each exercise. Start at half the above goal but build slowly to reach the above goals. If limited by weight, joint pain, or disability, we recommend daily walking in a swimming pool with water up to waist to reduce pressure from joints while allow for adequate exercise.    BLOOD GLUCOSES Monitoring your blood glucoses is important for continued management of your diabetes. Please check your blood glucoses 2-4 times a day: fasting, before meals and at bedtime (you can rotate these measurements - e.g. one day check before the 3 meals, the next day check before 2 of the meals and before bedtime, etc.).   HYPOGLYCEMIA (low blood  sugar) Hypoglycemia is usually a reaction to not eating, exercising, or taking too much insulin/ other diabetes drugs.  Symptoms include tremors, sweating, hunger, confusion, headache, etc. Treat IMMEDIATELY with 15 grams of Carbs: . 4 glucose tablets .  cup regular juice/soda . 2 tablespoons raisins . 4 teaspoons sugar . 1 tablespoon honey Recheck blood glucose in 15 mins and repeat above if still symptomatic/blood glucose <100.  RECOMMENDATIONS TO REDUCE YOUR RISK OF DIABETIC COMPLICATIONS: * Take your prescribed MEDICATION(S) * Follow a DIABETIC diet: Complex carbs, fiber rich foods, (monounsaturated and polyunsaturated) fats * AVOID saturated/trans fats, high fat foods, >2,300 mg salt per day. * EXERCISE at least 5 times a week for 30 minutes or preferably daily.  * DO NOT SMOKE OR DRINK more than 1 drink a day. * Check your FEET every day. Do not wear tightfitting shoes. Contact us if you develop an ulcer * See your EYE doctor once a year or more if needed * Get a FLU shot once a year * Get a PNEUMONIA vaccine once before and once after age 1 years  GOALS:  * Your Hemoglobin A1c of <7%  * fasting sugars need to be <130 * after meals sugars need to be <180 (2h after you start eating) * Your Systolic BP should be 935 or lower  * Your Diastolic BP should be 80 or lower  * Your HDL (Good Cholesterol) should be 40 or higher  * Your LDL (Bad Cholesterol) should be 100 or lower. * Your Triglycerides should be 150 or  lower  * Your Urine microalbumin (kidney function) should be <30 * Your Body Mass Index should be 25 or lower    Please consider the following ways to cut down carbs and fat and increase fiber and micronutrients in your diet: - substitute whole grain for white bread or pasta - substitute brown rice for white rice - substitute 90-calorie flat bread pieces for slices of bread when possible - substitute sweet potatoes or yams for white potatoes - substitute humus for  margarine - substitute tofu for cheese when possible - substitute almond or rice milk for regular milk (would not drink soy milk daily due to concern for soy estrogen influence on breast cancer risk) - substitute dark chocolate for other sweets when possible - substitute water - can add lemon or orange slices for taste - for diet sodas (artificial sweeteners will trick your body that you can eat sweets without getting calories and will lead you to overeating and weight gain in the long run) - do not skip breakfast or other meals (this will slow down the metabolism and will result in more weight gain over time)  - can try smoothies made from fruit and almond/rice milk in am instead of regular breakfast - can also try old-fashioned (not instant) oatmeal made with almond/rice milk in am - order the dressing on the side when eating salad at a restaurant (pour less than half of the dressing on the salad) - eat as little meat as possible - can try juicing, but should not forget that juicing will get rid of the fiber, so would alternate with eating raw veg./fruits or drinking smoothies - use as little oil as possible, even when using olive oil - can dress a salad with a mix of balsamic vinegar and lemon juice, for e.g. - use agave nectar, stevia sugar, or regular sugar rather than artificial sweateners - steam or broil/roast veggies  - snack on veggies/fruit/nuts (unsalted, preferably) when possible, rather than processed foods - reduce or eliminate aspartame in diet (it is in diet sodas, chewing gum, etc) Read the labels!  Try to read Dr. Janene Harvey book: "Program for Reversing Diabetes" for other ideas for healthy eating.

## 2016-12-22 MED FILL — TRESIBA FLEXTOUCH 200 UNITS: 200 | 24 days supply | Qty: 9 | Fill #1

## 2016-12-22 MED FILL — JANUVIA 100 MG TABLET: 100 | 90 days supply | Qty: 90 | Fill #1

## 2016-12-31 ENCOUNTER — Encounter: Payer: Self-pay | Admitting: Internal Medicine

## 2016-12-31 MED FILL — DESVENLAFAXINE SUC ER 100 M: 100 | 90 days supply | Qty: 90 | Fill #0

## 2017-01-14 MED FILL — TRESIBA FLEXTOUCH 200 UNITS: 200 | 24 days supply | Qty: 9 | Fill #2

## 2017-01-18 MED FILL — lamoTRIgine 150 MG TABS: 150 | 90 days supply | Qty: 90 | Fill #1

## 2017-01-20 MED FILL — UNIFINE PENTIPS 31GX3/16": 31G X 5 MM | 90 days supply | Qty: 100 | Fill #2

## 2017-01-20 MED FILL — UNIFINE PENTIPS 31GX3/16: 31G X 5 MM | 90 days supply | Qty: 100 | Fill #2

## 2017-02-12 ENCOUNTER — Encounter: Payer: Self-pay | Admitting: Internal Medicine

## 2017-02-12 ENCOUNTER — Ambulatory Visit (INDEPENDENT_AMBULATORY_CARE_PROVIDER_SITE_OTHER): Payer: 59 | Admitting: Internal Medicine

## 2017-02-12 VITALS — BP 116/76 | HR 110 | Resp 16 | Ht 68.0 in | Wt 281.2 lb

## 2017-02-12 DIAGNOSIS — Z6841 Body Mass Index (BMI) 40.0 and over, adult: Secondary | ICD-10-CM

## 2017-02-12 DIAGNOSIS — Z794 Long term (current) use of insulin: Secondary | ICD-10-CM | POA: Diagnosis not present

## 2017-02-12 DIAGNOSIS — E113211 Type 2 diabetes mellitus with mild nonproliferative diabetic retinopathy with macular edema, right eye: Secondary | ICD-10-CM | POA: Diagnosis not present

## 2017-02-12 DIAGNOSIS — E113291 Type 2 diabetes mellitus with mild nonproliferative diabetic retinopathy without macular edema, right eye: Secondary | ICD-10-CM | POA: Diagnosis not present

## 2017-02-12 DIAGNOSIS — E669 Obesity, unspecified: Secondary | ICD-10-CM | POA: Diagnosis not present

## 2017-02-12 DIAGNOSIS — IMO0001 Reserved for inherently not codable concepts without codable children: Secondary | ICD-10-CM

## 2017-02-12 LAB — POCT GLYCOSYLATED HEMOGLOBIN (HGB A1C): Hemoglobin A1C: 7.6

## 2017-02-12 MED ORDER — INSULIN DEGLUDEC 200 UNIT/ML ~~LOC~~ SOPN: 60.0000 [IU] | PEN_INJECTOR | Freq: Every day | SUBCUTANEOUS | 5 refills | Status: DC

## 2017-02-12 NOTE — Progress Notes (Signed)
Patient ID: Sarah Walls, female   DOB: 01-Jul-1960, 56 y.o.   MRN: [id number]   HPI: Sarah Walls is a 56 y.o.-year-old female, returning for f/u for DM2, dx in ~2010, insulin-dependent since ~2015-16, uncontrolled, with complications (mild DR). Last visit 2.5 mo ago.  She read Dr. Emilio Math book (Program for Reversing Diabetes) >> sugars better.  She also feels better and is very happy with the diet. She is not doing it 100%, still eats chicken twice a week and during the weekend she eats some eggs and cheese.  Last hemoglobin A1c was: Lab Results  Component Value Date   HGBA1C 7.6 02/12/2017   HGBA1C 8.8 11/27/2016   HGBA1C 9.2 (H) 09/15/2016   HGBA1C 8.5 (H) 04/27/2016   HGBA1C 8.6 (H) 01/27/2016   HGBA1C 9.2 (H) 10/04/2015   HGBA1C 10.0 (H) 06/28/2015   HGBA1C 7.9 (H) 03/11/2015   HGBA1C 10.0 (H) 10/31/2014   HGBA1C 10.2 (H) 02/07/2014   Pt was on a regimen of: - Metformin 500 mg 1x a day, with dinner. Higher doses >> diarrhea (she has IBS). - Januvia 100 mg daily with dinner - Lantus 75 units at bedtime - in the evening  At last visit, we changed to: - Tresiba 60 units and take it at bedtime - Metformin ER 1000 mg of metformin 2x a day with breakfast and dinner. - Januvia 100 mg before b'fast  She is in the [place] pgm.  Pt checks her sugars 1x a day and they are: - am: 90-120 when works; 125-231, occas. >300 - not in months >> 84-148, 153 - 2h after b'fast: n/c - before lunch: n/c - 2h after lunch: n/c - before dinner: n/c >> 134-183 - 2h after dinner: n/c >> 158-194, 238, 241 - bedtime: n/c >> 112-183,  - nighttime: n/c No lows. Lowest sugar was 85 >> 84; she has hypoglycemia awareness at 70.  Highest sugar was >300 >> 241.  Glucometer: AccuChek  Pt's meals are: - Breakfast: b'fast sandwich + yoghurt; boiled eggs + slice of bacon + yoghurt - Lunch: frozen meal +/- salad - Dinner: frozen meals - Snacks: zucchini bread, apple  She works night shift - 12h 3x a  week.  - no CKD, last BUN/creatinine:  Lab Results  Component Value Date   BUN 10 07/07/2016   BUN 7 04/27/2016   CREATININE 0.64 07/07/2016   CREATININE 0.58 04/27/2016  On Lisinopril. - last set of lipids: Lab Results  Component Value Date   CHOL 186 04/27/2016   HDL 52.00 04/27/2016   LDLCALC 110 (H) 04/27/2016   LDLDIRECT 140.1 10/06/2011   TRIG 120.0 04/27/2016   CHOLHDL 4 04/27/2016  On Zocor 40. - last eye exam was in 2017. + mild DR. In 2016 >> cataract sx. - no numbness and tingling in her feet.  Pt has FH of DM in M, F, PGM.  ROS: Constitutional: no weight gain/no weight loss, no fatigue, no subjective hyperthermia, no subjective hypothermia Eyes: no blurry vision, no xerophthalmia ENT: no sore throat, no nodules palpated in throat, no dysphagia, no odynophagia, no hoarseness Cardiovascular: no CP/no SOB/no palpitations/no leg swelling Respiratory: no cough/no SOB/no wheezing Gastrointestinal: no N/no V/no D/no C/no acid reflux Musculoskeletal: no muscle aches/no joint aches Skin: no rashes, no hair loss Neurological: no tremors/no numbness/no tingling/no dizziness  I reviewed pt's medications, allergies, PMH, social hx, family hx, and changes were documented in the history of present illness. Otherwise, unchanged from my initial visit note.   Past Medical  History:  Diagnosis Date  . Arthritis   . Bipolar disorder ([place])   . Cataract   . Depression    BAD2 - Dr. Albertine Patricia  . Hyperlipidemia   . Hypertension   . Panic attacks    BAD2 - Dr. Albertine Patricia  . Personal history of colonic polyps - adenomas 10/13/2013   12/2013 - 6 adenomas max 12 mm - repeat colonoscopy  2018    Past Surgical History:  Procedure Laterality Date  . CATARACT EXTRACTION Bilateral 2015  . CESAREAN SECTION  1990   Fetal Distress  . EYE SURGERY    . TONSILLECTOMY  1969   Social History   Social History  . Marital status: Married    Spouse name: N/A  . Number of children: 1    Occupational History  . 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    Social History Main Topics  . Smoking status: Never Smoker  . Smokeless tobacco: Never Used  . Alcohol use No  . Drug use: No   Social History Narrative   05/2009 Masters in counseling in disabled.   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       Mother lived in home.  (Deceased quickly Spring 09 from metastatic melanoma)   Current Outpatient Prescriptions on File Prior to Visit  Medication Sig Dispense Refill  . ACCU-CHEK FASTCLIX LANCETS MISC Check blood 1 - 2 times daily and as directed. Dx E11.65 200 each 1  . ALPRAZolam (XANAX) 0.5 MG tablet Take 0.5 mg by mouth at bedtime as needed (for panic attacks.). May take up to 4 tablets per day as needed    . aspirin 81 MG tablet Take 81 mg by mouth daily.    . chlorhexidine (PERIDEX) 0.12 % solution   3  . Cholecalciferol (VITAMIN D) 2000 units CAPS Take 1 capsule (2,000 Units total) by mouth daily. 30 capsule   . desvenlafaxine (PRISTIQ) 100 MG 24 hr tablet Take 100 mg by mouth daily.    Marland Kitchen glucose blood (ACCU-CHEK GUIDE) test strip Check blood 1 - 2 times daily and as directed. Dx E11.65 200 each 1  . Insulin Degludec (TRESIBA FLEXTOUCH) 200 UNIT/ML SOPN Inject 74 Units into the skin daily at 2 PM. (Patient taking differently: Inject 60 Units into the skin daily at 2 PM. ) 3 pen 11  . lamoTRIgine (LAMICTAL) 150 MG tablet Take 150 mg by mouth daily.    Marland Kitchen lamoTRIgine (LAMICTAL) 25 MG tablet Take 25 mg by mouth as needed.    Elmore Guise Device MISC Use to test blood sugar once or twice daily.  Diagnosis E11.65  Insulin dependent.  True Metrix 1 each 0  . lisinopril (PRINIVIL,ZESTRIL) 10 MG tablet Take 1 tablet (10 mg total) by mouth daily. 90 tablet 1  . metFORMIN (GLUCOPHAGE-XR) 500 MG 24 hr tablet Take 2 tablets (1,000 mg total) by mouth 2 (two) times daily with a meal. 360 tablet 3  . Multiple Vitamin  (MULTIVITAMIN) tablet Take 1 tablet by mouth daily.      . simvastatin (ZOCOR) 40 MG tablet Take 1 tablet (40 mg total) by mouth at bedtime. 90 tablet 2  . sitaGLIPtin (JANUVIA) 100 MG tablet Take 1 tablet (100 mg total) by mouth daily. 90 tablet 1  . UNIFINE PENTIPS 31G X 5 MM MISC USE DAILY WITH INSULIN PEN 100 each 4   No current facility-administered medications on file prior to visit.    Allergies  Allergen Reactions  . Beta Adrenergic Blockers Other (See Comments)    Drowsy, fatigue   Family History  Problem Relation Age of Onset  . Cancer Mother        Metastatic liver CA  . Thyroid disease Mother        Hypothyroidism  . Vision loss Mother        Eye tumor, removed orbit at the end of 2004  . Melanoma Mother   . Diabetes Mother   . Diabetes Father   . Heart disease Father        CABG, coronary disease  . Thyroid disease Sister        Hypothyroidism  . Colon cancer Maternal Aunt 81  . Breast cancer Neg Hx    PE: BP 116/76   Pulse (!) 110   Resp 16   Ht 5\' 8"  (1.727 m)   Wt 281 lb 3.2 oz (127.6 kg)   LMP 08/28/2015 (Approximate)   SpO2 95%   BMI 42.76 kg/m  Wt Readings from Last 3 Encounters:  02/12/17 281 lb 3.2 oz (127.6 kg)  11/27/16 282 lb (127.9 kg)  09/17/16 287 lb 12 oz (130.5 kg)   Constitutional: obese, in NAD Eyes: PERRLA, EOMI, no exophthalmos ENT: moist mucous membranes, no thyromegaly, no cervical lymphadenopathy Cardiovascular: tachycardia, RR, No MRG Respiratory: CTA B Gastrointestinal: abdomen soft, NT, ND, BS+ Musculoskeletal: no deformities, strength intact in all 4 Skin: moist, warm, no rashes Neurological: no tremor with outstretched hands, DTR normal in all 4  ASSESSMENT: 1. 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 Obesity class 3  PLAN:  1. Patient with long-standing, uncontrolled diabetes, on oral antidiabetic regimen and basal insulin, with improvement of her sugars since last visit after she  started to transition to a more plant-based diet. She is very excited about continuing the diet and is trying to follow it as much as she can. She already saw improvement in her sugars and she feels much better. She reduced her insulin since last visit by 15 units, and we'll continue the current dose. - We'll continue with metformin extended-release and Januvia. - I congratulated her about the change in diet and strongly advised her to continue. I gave her more references.  - I suggested to:  Patient Instructions  Please continue; - Tresiba 60 units and take it at bedtime - Metformin ER 1000 mg of metformin 2x a day with breakfast and dinner. - Januvia 100 mg before b'fast  Read the following books: Dr. Karl Luke - Prevent and Reverse - Heart Disease Dr. Alden Benjamin - How Not to Die Christa See - The Engine 2 Diet  Please return in 3 months with your sugar log.   - today, HbA1c is 7.6% (improved) - continue checking sugars at different times of the day - check 1x a day, rotating checks - advised for yearly eye exams >> she needs one - Return to clinic in 3 mo with sugar log   2. Obesity class 3 - She did not lose weight after she started the diet, but I strongly advised her to continue and to try to transition more towards plant-based foods only   Philemon Kingdom, MD PhD [facility] Endocrinology

## 2017-02-12 NOTE — Patient Instructions (Addendum)
Please continue; - Tresiba 60 units and take it at bedtime - Metformin ER 1000 mg of metformin 2x a day with breakfast and dinner. - Januvia 100 mg before b'fast  Read the following books: Dr. Karl Luke - Prevent and Reverse - Heart Disease Dr. Alden Benjamin - How Not to Die Christa See - The Engine 2 Diet  Please return in 3 months with your sugar log.

## 2017-02-23 MED FILL — TRESIBA FLEXTOUCH 200 UNITS: 200 | 30 days supply | Qty: 9 | Fill #0

## 2017-03-03 ENCOUNTER — Other Ambulatory Visit: Payer: Self-pay | Admitting: Family Medicine

## 2017-03-03 MED FILL — ACCU-CHEK GUIDE TEST STRIP: 90 days supply | Qty: 200 | Fill #1

## 2017-03-03 MED FILL — SIMVASTATIN 40 MG TABLET: 40 | 90 days supply | Qty: 90 | Fill #2

## 2017-03-04 MED FILL — LISINOPRIL 10 MG TABS: 10 | 90 days supply | Qty: 90 | Fill #0

## 2017-03-09 MED FILL — METFORMIN HCL ER 500 MG TAB: 500 | 90 days supply | Qty: 360 | Fill #1

## 2017-03-22 ENCOUNTER — Other Ambulatory Visit: Payer: Self-pay | Admitting: Family Medicine

## 2017-03-22 MED FILL — TRESIBA FLEXTOUCH 200 UNITS: 200 | 30 days supply | Qty: 9 | Fill #1

## 2017-03-22 NOTE — Telephone Encounter (Signed)
Electronic refill request. Januvia Last office visit:   09/17/16  Does Dr. Cruzita Lederer prescribe this? Last Filled:    90 tablet 1 09/09/2016  Please advise.

## 2017-03-23 NOTE — Telephone Encounter (Signed)
Routed to Dr. Cruzita Lederer, appreciate her help.  Thanks.

## 2017-04-01 MED FILL — CHLORHEXIDINE 0.12% RINSE: 0.12 | 15 days supply | Qty: 473 | Fill #0

## 2017-04-05 ENCOUNTER — Other Ambulatory Visit: Payer: Self-pay

## 2017-04-05 ENCOUNTER — Telehealth: Payer: Self-pay | Admitting: Family Medicine

## 2017-04-05 MED ORDER — SITAGLIPTIN PHOSPHATE 100 MG PO TABS: 100.0000 mg | ORAL_TABLET | Freq: Every day | ORAL | 1 refills | Status: DC

## 2017-04-05 MED FILL — JANUVIA 100 MG TABLET: 100 | 90 days supply | Qty: 90 | Fill #0

## 2017-04-05 NOTE — Telephone Encounter (Signed)
Submitted

## 2017-04-05 NOTE — Telephone Encounter (Signed)
Copied from [place] #[id number]. Topic: Quick Communication - See Telephone Encounter >> Apr 05, 2017  3:01 PM Burnis Medin, NT wrote: CRM for notification. See Telephone encounter for:  04/05/17. Pt. Called about med refill that has been sent to pharmacy a week ago. Medication is sitaGLIPtin (JANUVIA) 100 MG tablet.

## 2017-04-05 NOTE — Telephone Encounter (Signed)
Pt sees endocrinology and medication previously filled

## 2017-04-05 NOTE — Telephone Encounter (Signed)
Pt called requesting a refill on her sitaGLIPtin (JANUVIA) 100 MG tablet. Please advise, thank you!  [place], [place]

## 2017-04-07 MED FILL — DESVENLAFAXINE SUC ER 100 M: 100 | 90 days supply | Qty: 90 | Fill #1

## 2017-04-08 MED FILL — UNIFINE PENTIPS 31GX3/16": 31G X 5 MM | 90 days supply | Qty: 100 | Fill #3

## 2017-04-08 MED FILL — UNIFINE PENTIPS 31GX3/16: 31G X 5 MM | 90 days supply | Qty: 100 | Fill #3

## 2017-04-08 MED FILL — lamoTRIgine 25 MG TABS: 25 | 90 days supply | Qty: 90 | Fill #2

## 2017-04-26 MED FILL — TRESIBA FLEXTOUCH 200 UNITS: 200 | 30 days supply | Qty: 9 | Fill #2

## 2017-04-28 MED FILL — lamoTRIgine 150 MG TABS: 150 | 90 days supply | Qty: 90 | Fill #2

## 2017-05-03 ENCOUNTER — Encounter: Payer: Self-pay | Admitting: Internal Medicine

## 2017-05-18 ENCOUNTER — Other Ambulatory Visit: Payer: Self-pay

## 2017-05-18 NOTE — Patient Outreach (Signed)
[facility]) Care Management  05/18/2017  Sarah Walls [birth year] [id number]   Case closed in [place].  Member is being followed by the [employer] program Member enrolled in an external program. Peter Garter RN, [facility] Care Management Coordinator-Link to [place] Management [phone]

## 2017-05-21 ENCOUNTER — Ambulatory Visit: Payer: 59 | Admitting: Internal Medicine

## 2017-06-11 ENCOUNTER — Ambulatory Visit (AMBULATORY_SURGERY_CENTER): Payer: Self-pay | Admitting: *Deleted

## 2017-06-11 ENCOUNTER — Other Ambulatory Visit: Payer: Self-pay

## 2017-06-11 VITALS — Ht 68.0 in | Wt 289.8 lb

## 2017-06-11 DIAGNOSIS — Z86010 Personal history of colonic polyps: Secondary | ICD-10-CM

## 2017-06-11 NOTE — Progress Notes (Signed)
No egg or soy allergy known to patient  No issues with past sedation with any surgeries  or procedures, no intubation problems  No diet pills per patient No home 02 use per patient  No blood thinners per patient  Pt denies issues with constipation  No A fib or A flutter  EMMI video sent to pt's e mail  

## 2017-06-13 ENCOUNTER — Telehealth: Payer: Self-pay | Admitting: Family Medicine

## 2017-06-13 DIAGNOSIS — Z1211 Encounter for screening for malignant neoplasm of colon: Secondary | ICD-10-CM

## 2017-06-13 NOTE — Telephone Encounter (Signed)
Ordered. Thanks

## 2017-06-13 NOTE — Telephone Encounter (Signed)
Copied from [place]. Topic: Referral - Request >> Jun 11, 2017  9:07 AM Aurelio Brash B wrote: Reason for CRM: Pt is having colonoscopy 1/11  with Dr Silvano Rusk  and her insurance is changing so she needs referral

## 2017-06-14 MED FILL — TRESIBA FLEXTOUCH 200 UNITS: 200 | 30 days supply | Qty: 9 | Fill #3

## 2017-06-14 NOTE — Telephone Encounter (Signed)
Left message for patient. We dont do any Auth for these referrals, patient obtains Auth directly on the [employer] App on their phone.

## 2017-06-18 ENCOUNTER — Other Ambulatory Visit: Payer: Self-pay | Admitting: Family Medicine

## 2017-06-18 MED FILL — LISINOPRIL 10 MG TABS: 10 | 90 days supply | Qty: 90 | Fill #1

## 2017-06-18 MED FILL — SIMVASTATIN 40 MG TABLET: 40 | 90 days supply | Qty: 90 | Fill #0

## 2017-06-25 ENCOUNTER — Ambulatory Visit (AMBULATORY_SURGERY_CENTER): Payer: No Typology Code available for payment source | Admitting: Internal Medicine

## 2017-06-25 ENCOUNTER — Encounter: Payer: Self-pay | Admitting: Internal Medicine

## 2017-06-25 ENCOUNTER — Other Ambulatory Visit: Payer: Self-pay

## 2017-06-25 VITALS — BP 117/68 | HR 111 | Temp 99.3°F | Resp 20 | Ht 68.0 in | Wt 289.0 lb

## 2017-06-25 DIAGNOSIS — D124 Benign neoplasm of descending colon: Secondary | ICD-10-CM | POA: Diagnosis not present

## 2017-06-25 DIAGNOSIS — Z86010 Personal history of colonic polyps: Secondary | ICD-10-CM

## 2017-06-25 DIAGNOSIS — D128 Benign neoplasm of rectum: Secondary | ICD-10-CM

## 2017-06-25 DIAGNOSIS — D123 Benign neoplasm of transverse colon: Secondary | ICD-10-CM | POA: Diagnosis not present

## 2017-06-25 HISTORY — PX: COLONOSCOPY: SHX174

## 2017-06-25 MED ORDER — SODIUM CHLORIDE 0.9 % IV SOLN: 500.0000 mL | Freq: Once | INTRAVENOUS | Status: DC

## 2017-06-25 NOTE — Op Note (Signed)
[place] Patient Name: Sarah Walls Procedure Date: 06/25/2017 1:24 PM MRN: [id number] Endoscopist: Gatha Mayer , MD Age: 57 Referring MD:  Date of Birth: June 28, 1960 Gender: Female Account #: [id number] Procedure:                Colonoscopy Indications:              Surveillance: Personal history of adenomatous                            polyps on last colonoscopy 3 years ago Medicines:                Propofol per Anesthesia, Monitored Anesthesia Care Procedure:                Pre-Anesthesia Assessment:                           - Prior to the procedure, a History and Physical                            was performed, and patient medications and                            allergies were reviewed. The patient's tolerance of                            previous anesthesia was also reviewed. The risks                            and benefits of the procedure and the sedation                            options and risks were discussed with the patient.                            All questions were answered, and informed consent                            was obtained. Prior Anticoagulants: The patient has                            taken no previous anticoagulant or antiplatelet                            agents. ASA Grade Assessment: III - A patient with                            severe systemic disease. After reviewing the risks                            and benefits, the patient was deemed in                            satisfactory condition to undergo the procedure.  After obtaining informed consent, the colonoscope                            was passed under direct vision. Throughout the                            procedure, the patient's blood pressure, pulse, and                            oxygen saturations were monitored continuously. The                            Colonoscope was introduced through the anus and                             advanced to the the cecum, identified by                            appendiceal orifice and ileocecal valve. The                            colonoscopy was somewhat difficult due to                            significant looping. Successful completion of the                            procedure was aided by using manual pressure. The                            patient tolerated the procedure well. The quality                            of the bowel preparation was adequate. The bowel                            preparation used was Miralax. The ileocecal valve,                            appendiceal orifice, and rectum were photographed. Scope In: 1:33:54 PM Scope Out: 1:58:53 PM Scope Withdrawal Time: 0 hours 18 minutes 20 seconds  Total Procedure Duration: 0 hours 24 minutes 59 seconds  Findings:                 The perianal and digital rectal examinations were                            normal.                           Five sessile polyps were found in the rectum,                            descending colon and transverse colon. The polyps  were 3 to 6 mm in size. These polyps were removed                            with a cold snare. Resection and retrieval were                            complete. Verification of patient identification                            for the specimen was done. Estimated blood loss was                            minimal.                           The colon (entire examined portion) was moderately                            redundant. Advancing the scope required withdrawing                            and reinserting the scope and applying abdominal                            pressure.                           The exam was otherwise without abnormality on                            direct and retroflexion views. Complications:            No immediate complications. Estimated Blood Loss:     Estimated blood loss was  minimal. Impression:               - Five 3 to 6 mm polyps in the rectum, in the                            descending colon and in the transverse colon,                            removed with a cold snare. Resected and retrieved.                           - Redundant colon.                           - The examination was otherwise normal on direct                            and retroflexion views.                           - Personal history of colonic polyps - 6 adenomas  2015. Recommendation:           - Patient has a contact number available for                            emergencies. The signs and symptoms of potential                            delayed complications were discussed with the                            patient. Return to normal activities tomorrow.                            Written discharge instructions were provided to the                            patient.                           - Resume previous diet.                           - Continue present medications.                           - Repeat colonoscopy is recommended for                            surveillance. The colonoscopy date will be                            determined after pathology results from today's                            exam become available for review.                           - Consider extra vs differnt prep next time Gatha Mayer, MD 06/25/2017 2:06:33 PM This report has been signed electronically.

## 2017-06-25 NOTE — Patient Instructions (Addendum)
   I found and removed 5 small polyps. I will let you know pathology results and when to have another routine colonoscopy by mail and/or My Chart.  I appreciate the opportunity to care for you. Gatha Mayer, MD, [facility]  *Handout given on polyps*  YOU HAD AN ENDOSCOPIC PROCEDURE TODAY AT [facility]:   Refer to the procedure report that was given to you for any specific questions about what was found during the examination.  If the procedure report does not answer your questions, please call your gastroenterologist to clarify.  If you requested that your care partner not be given the details of your procedure findings, then the procedure report has been included in a sealed envelope for you to review at your convenience later.  YOU SHOULD EXPECT: Some feelings of bloating in the abdomen. Passage of more gas than usual.  Walking can help get rid of the air that was put into your GI tract during the procedure and reduce the bloating. If you had a lower endoscopy (such as a colonoscopy or flexible sigmoidoscopy) you may notice spotting of blood in your stool or on the toilet paper. If you underwent a bowel prep for your procedure, you may not have a normal bowel movement for a few days.  Please Note:  You might notice some irritation and congestion in your nose or some drainage.  This is from the oxygen used during your procedure.  There is no need for concern and it should clear up in a day or so.  SYMPTOMS TO REPORT IMMEDIATELY:   Following lower endoscopy (colonoscopy or flexible sigmoidoscopy):  Excessive amounts of blood in the stool  Significant tenderness or worsening of abdominal pains  Swelling of the abdomen that is new, acute  Fever of 100F or higher   For urgent or emergent issues, a gastroenterologist can be reached at any hour by calling [phone].   DIET:  We do recommend a small meal at first, but then you may proceed to your regular diet.  Drink  plenty of fluids but you should avoid alcoholic beverages for 24 hours.  ACTIVITY:  You should plan to take it easy for the rest of today and you should NOT DRIVE or use heavy machinery until tomorrow (because of the sedation medicines used during the test).    FOLLOW UP: Our staff will call the number listed on your records the next business day following your procedure to check on you and address any questions or concerns that you may have regarding the information given to you following your procedure. If we do not reach you, we will leave a message.  However, if you are feeling well and you are not experiencing any problems, there is no need to return our call.  We will assume that you have returned to your regular daily activities without incident.  If any biopsies were taken you will be contacted by phone or by letter within the next 1-3 weeks.  Please call us at [phone] if you have not heard about the biopsies in 3 weeks.    SIGNATURES/CONFIDENTIALITY: You and/or your care partner have signed paperwork which will be entered into your electronic medical record.  These signatures attest to the fact that that the information above on your After Visit Summary has been reviewed and is understood.  Full responsibility of the confidentiality of this discharge information lies with you and/or your care-partner.

## 2017-06-25 NOTE — Progress Notes (Signed)
Pt's states no medical or surgical changes since previsit or office visit. 

## 2017-06-25 NOTE — Progress Notes (Signed)
Called to room to assist during endoscopic procedure.  Patient ID and intended procedure confirmed with present staff. Received instructions for my participation in the procedure from the performing physician.  

## 2017-06-25 NOTE — Progress Notes (Signed)
To recovery, report to RN, VSS. 

## 2017-06-28 ENCOUNTER — Telehealth: Payer: Self-pay | Admitting: *Deleted

## 2017-06-28 ENCOUNTER — Telehealth: Payer: Self-pay

## 2017-06-28 NOTE — Telephone Encounter (Signed)
Left message on f/u call 

## 2017-06-28 NOTE — Telephone Encounter (Signed)
  Follow up Call-  Call back number 06/25/2017  Post procedure Call Back phone  # [phone]  Permission to leave phone message Yes  Some recent data might be hidden     Patient questions:  Do you have a fever, pain , or abdominal swelling? No. Pain Score  0 *  Have you tolerated food without any problems? Yes.    Have you been able to return to your normal activities? Yes.    Do you have any questions about your discharge instructions: Diet   No. Medications  No. Follow up visit  No.  Do you have questions or concerns about your Care? No.  Actions: * If pain score is 4 or above: No action needed, pain <4.

## 2017-06-29 ENCOUNTER — Encounter: Payer: Self-pay | Admitting: Internal Medicine

## 2017-06-29 NOTE — Progress Notes (Signed)
5 sub cm adenomas Recall 2022 My Chart letter

## 2017-07-14 MED FILL — TRESIBA FLEXTOUCH 200 UNITS: 200 | 30 days supply | Qty: 9 | Fill #4

## 2017-07-23 ENCOUNTER — Encounter: Payer: Self-pay | Admitting: Internal Medicine

## 2017-07-23 ENCOUNTER — Other Ambulatory Visit: Payer: Self-pay | Admitting: Family Medicine

## 2017-07-23 ENCOUNTER — Ambulatory Visit: Payer: No Typology Code available for payment source | Admitting: Internal Medicine

## 2017-07-23 VITALS — BP 132/84 | HR 120 | Ht 67.5 in | Wt 286.0 lb

## 2017-07-23 DIAGNOSIS — E113291 Type 2 diabetes mellitus with mild nonproliferative diabetic retinopathy without macular edema, right eye: Secondary | ICD-10-CM

## 2017-07-23 DIAGNOSIS — Z794 Long term (current) use of insulin: Secondary | ICD-10-CM

## 2017-07-23 LAB — POCT GLYCOSYLATED HEMOGLOBIN (HGB A1C): Hemoglobin A1C: 9.4

## 2017-07-23 MED ORDER — INSULIN PEN NEEDLE 31G X 5 MM MISC: 4 refills | Status: DC

## 2017-07-23 MED ORDER — DULAGLUTIDE 0.75 MG/0.5ML ~~LOC~~ SOAJ: SUBCUTANEOUS | 2 refills | Status: DC

## 2017-07-23 MED ORDER — GLUCOSE BLOOD VI STRP: ORAL_STRIP | 3 refills | Status: DC

## 2017-07-23 MED FILL — JANUVIA 100 MG TABLET: 100 | 90 days supply | Qty: 90 | Fill #1

## 2017-07-23 MED FILL — UNIFINE PENTIPS 31GX3/16: 31G X 5 MM | 90 days supply | Qty: 100 | Fill #0

## 2017-07-23 MED FILL — ACCU-CHEK GUIDE TEST STRIP: 90 days supply | Qty: 200 | Fill #0

## 2017-07-23 MED FILL — UNIFINE PENTIPS 31GX3/16": 31G X 5 MM | 90 days supply | Qty: 100 | Fill #0

## 2017-07-23 NOTE — Patient Instructions (Addendum)
Please continue; - Tresiba 60 units in the evening - Metformin ER 1000 mg 2x a day with meals  Please start Trulicity 4.76 mg weekly. Let me know when you are close to running out to call in the higher dose to your pharmacy (1.5 mg).  Stop Januvia before the second Trulicity dose.  Read the following books: Dr. Karl Luke - Prevent and Reverse - Heart Disease Dr. Alden Benjamin - How Not to Ernestine Mcmurray - The Engine 2 Diet  Please return in 3 months with your sugar log.

## 2017-07-23 NOTE — Progress Notes (Signed)
Patient ID: Sarah Walls, female   DOB: Aug 12, 1960, 57 y.o.   MRN: [id number]   HPI: Sarah Walls is a 57 y.o.-year-old female, returning for f/u for DM2, dx in ~2010, insulin-dependent since ~2015-16, uncontrolled, with complications (mild DR). Last visit 4 months ago  At last visit, she is switched to a more plant-based diet after reading Dr. Emilio Math book (Program for Reversing Diabetes).  Her sugars were better and she was also feeling better.  However, since last visit, she fell off the wagon with her diet >> sugars are higher.  Last hemoglobin A1c was: Lab Results  Component Value Date   HGBA1C 7.6 02/12/2017   HGBA1C 8.8 11/27/2016   HGBA1C 9.2 (H) 09/15/2016   HGBA1C 8.5 (H) 04/27/2016   HGBA1C 8.6 (H) 01/27/2016   HGBA1C 9.2 (H) 10/04/2015   HGBA1C 10.0 (H) 06/28/2015   HGBA1C 7.9 (H) 03/11/2015   HGBA1C 10.0 (H) 10/31/2014   HGBA1C 10.2 (H) 02/07/2014   Pt was on a regimen of: - Metformin 500 mg 1x a day, with dinner. Higher doses >> diarrhea (she has IBS). - Januvia 100 mg daily with dinner - Lantus 75 units at bedtime - in the evening  She is now on: - Tresiba 60 units in p.m. - Metformin ER 1000 mg 2x a day - Januvia 100 mg before b'fast She was in the [place] pgm.  Pt checks her sugars 1x a day: - am: 125-231, occas. >300 >> 84-148, 153 >> 122-194, 212 - 2h after b'fast: n/c - before lunch: n/c - 2h after lunch: n/c >> 264 - before dinner: n/c >> 134-183 >> n/c - 2h after dinner: n/c >> 158-194, 238, 241 >> 235, 249 - bedtime: n/c >> 112-183 >> 171-260, 302 - nighttime: n/c Lowest sugar was 85 >> 84 >> 122; she has hypoglycemia awareness at 70s.  Highest sugar was >300 >> 241 >> 302  Glucometer: AccuChek  Pt's meals are: - Breakfast: b'fast sandwich + yoghurt; boiled eggs + slice of bacon + yoghurt - Lunch: frozen meal +/- salad - Dinner: frozen meals - Snacks: zucchini bread, apple  She works night shift - 12h 3 times a week  -No CKD, last  BUN/creatinine:  Lab Results  Component Value Date   BUN 10 07/07/2016   BUN 7 04/27/2016   CREATININE 0.64 07/07/2016   CREATININE 0.58 04/27/2016  On lisinopril. -+ HL; last set of lipids: Lab Results  Component Value Date   CHOL 186 04/27/2016   HDL 52.00 04/27/2016   LDLCALC 110 (H) 04/27/2016   LDLDIRECT 140.1 10/06/2011   TRIG 120.0 04/27/2016   CHOLHDL 4 04/27/2016  On Zocor 40. - last eye exam was in 2017: + mild DR. she had cataract surgery in 2016 - no numbness and tingling in her feet.  ROS: Constitutional: no weight gain/no weight loss, + fatigue, no subjective hyperthermia, no subjective hypothermia Eyes: no blurry vision, no xerophthalmia ENT: no sore throat, no nodules palpated in throat, no dysphagia, no odynophagia, no hoarseness Cardiovascular: no CP/no SOB/no palpitations/no leg swelling Respiratory: no cough/no SOB/no wheezing Gastrointestinal: no N/no V/no D/no C/no acid reflux Musculoskeletal: no muscle aches/no joint aches Skin: no rashes, no hair loss Neurological: no tremors/no numbness/no tingling/no dizziness  I reviewed pt's medications, allergies, PMH, social hx, family hx, and changes were documented in the history of present illness. Otherwise, unchanged from my initial visit note.   Past Medical History:  Diagnosis Date  . Allergy   . Arthritis   .  Bipolar disorder ([place])   . Cataract   . Depression    BAD2 - Dr. Albertine Patricia  . Diabetes mellitus without complication ([place])   . Hyperlipidemia   . Hypertension   . Panic attacks    BAD2 - Dr. Albertine Patricia  . Personal history of colonic polyps - adenomas 10/13/2013   12/2013 - 6 adenomas max 12 mm - repeat colonoscopy  2018   . Tuberculosis    positive PPD   Past Surgical History:  Procedure Laterality Date  . CATARACT EXTRACTION Bilateral 2015  . CESAREAN SECTION  1990   Fetal Distress  . COLONOSCOPY    . EYE SURGERY    . POLYPECTOMY    . TONSILLECTOMY  1969   Social History   Social  History  . Marital status: Married    Spouse name: N/A  . Number of children: 1   Occupational History  . 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    Social History Main Topics  . Smoking status: Never Smoker  . Smokeless tobacco: Never Used  . Alcohol use No  . Drug use: No   Social History Narrative   05/2009 Masters in counseling in disabled.   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       Mother lived in home.  (Deceased quickly Spring 09 from metastatic melanoma)   Current Outpatient Medications on File Prior to Visit  Medication Sig Dispense Refill  . ACCU-CHEK FASTCLIX LANCETS MISC Check blood 1 - 2 times daily and as directed. Dx E11.65 200 each 1  . ALPRAZolam (XANAX) 0.5 MG tablet Take 0.5 mg by mouth at bedtime as needed (for panic attacks.). May take up to 4 tablets per day as needed    . aspirin 81 MG tablet Take 81 mg by mouth daily.    . chlorhexidine (PERIDEX) 0.12 % solution   3  . desvenlafaxine (PRISTIQ) 100 MG 24 hr tablet Take 100 mg by mouth daily.    Marland Kitchen glucose blood (ACCU-CHEK GUIDE) test strip Check blood 1 - 2 times daily and as directed. Dx E11.65 200 each 1  . Insulin Degludec (TRESIBA FLEXTOUCH) 200 UNIT/ML SOPN Inject 60 Units into the skin daily at 2 PM. 6 pen 5  . lamoTRIgine (LAMICTAL) 150 MG tablet Take 150 mg by mouth daily.    Marland Kitchen lamoTRIgine (LAMICTAL) 25 MG tablet Take 25 mg by mouth as needed.    Elmore Guise Device MISC Use to test blood sugar once or twice daily.  Diagnosis E11.65  Insulin dependent.  True Metrix 1 each 0  . lisinopril (PRINIVIL,ZESTRIL) 10 MG tablet TAKE 1 TABLET BY MOUTH DAILY. 90 tablet 1  . metFORMIN (GLUCOPHAGE-XR) 500 MG 24 hr tablet Take 2 tablets (1,000 mg total) by mouth 2 (two) times daily with a meal. 360 tablet 3  . Multiple Vitamin (MULTIVITAMIN) tablet Take 1 tablet by mouth daily.      . simvastatin (ZOCOR) 40 MG tablet TAKE 1 TABLET BY MOUTH AT BEDTIME.  90 tablet 1  . sitaGLIPtin (JANUVIA) 100 MG tablet Take 1 tablet (100 mg total) by mouth daily. 90 tablet 1  . UNIFINE PENTIPS 31G X 5 MM MISC USE DAILY WITH INSULIN PEN 100 each 4   No current facility-administered medications on file prior to visit.    Allergies  Allergen Reactions  . Beta Adrenergic Blockers Other (See Comments)    Drowsy, fatigue   Family History  Problem Relation Age of  Onset  . Cancer Mother        Metastatic liver CA  . Thyroid disease Mother        Hypothyroidism  . Vision loss Mother        Eye tumor, removed orbit at the end of 2004  . Melanoma Mother   . Diabetes Mother   . Diabetes Father   . Heart disease Father        CABG, coronary disease  . Thyroid disease Sister        Hypothyroidism  . Colon cancer Maternal Aunt 67  . Breast cancer Neg Hx   . Colon polyps Neg Hx   . Esophageal cancer Neg Hx   . Rectal cancer Neg Hx   . Stomach cancer Neg Hx    PE: BP 132/84   Pulse (!) 120   Ht 5' 7.5" (1.715 m)   Wt 286 lb (129.7 kg)   LMP 08/28/2015 (Approximate)   SpO2 95%   BMI 44.13 kg/m  Wt Readings from Last 3 Encounters:  07/23/17 286 lb (129.7 kg)  06/25/17 289 lb (131.1 kg)  06/11/17 289 lb 12.8 oz (131.5 kg)   Constitutional: overweight, in NAD Eyes: PERRLA, EOMI, no exophthalmos ENT: moist mucous membranes, no thyromegaly, no cervical lymphadenopathy Cardiovascular: _+ tachycardia (chronic), RR, No MRG Respiratory: CTA B Gastrointestinal: abdomen soft, NT, ND, BS+ Musculoskeletal: no deformities, strength intact in all 4 Skin: moist, warm, no rashes Neurological: no tremor with outstretched hands, DTR normal in all 4  ASSESSMENT: 1. 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 Obesity class 3  PLAN:  1. Patient with long-standing, uncontrolled, diabetes, on oral antidiabetic regimen and basal insulin, with improvement in her sugars at last visit after she transitioned to a more plant-based diet.   She was very excited about continuing the diet and was feeling much better.  She was also able to reduce her basal insulin by 15 units.  We continued the lower dose. - sugars much higher now after she relaxed her diet >> will restart the diet but also replace Januvia with Trulicity - diet will also greatly help >> she will restart - also discussed to get some form of exercise - I suggested to:  Patient Instructions  Please continue; - Tresiba 60 units in the evening - Metformin ER 1000 mg 2x a day with meals  Please start Trulicity 5.00 mg weekly. Let me know when you are close to running out to call in the higher dose to your pharmacy (1.5 mg).  Stop Januvia before the second Trulicity dose.  Read the following books: Dr. Karl Luke - Prevent and Reverse - Heart Disease Dr. Alden Benjamin - How Not to Die Christa See - The Engine 2 Diet  Please return in 3 months with your sugar log.   - today, HbA1c is 9.4% (much higher) - continue checking sugars at different times of the day - check 1x a day, rotating checks - advised for yearly eye exams >> she is UTD - Return to clinic in 3 mo with sugar log   2. Obesity class 3 - She gained weight over the holidays: 8 pounds, then lost 3 lbs - a Whole food plant-based diet will greatly help >> given new recs   Philemon Kingdom, MD PhD [facility] Endocrinology

## 2017-07-27 ENCOUNTER — Telehealth: Payer: Self-pay

## 2017-07-27 NOTE — Telephone Encounter (Signed)
She has obesity class 3 >> I would not suggest a sulfonylurea or Actos in her case  - Trulicity would be medically indicated.

## 2017-07-27 NOTE — Telephone Encounter (Signed)
The PA will not be approved until she has tried 2 of the 3 medications listed in the last 120 days because of step therapy.  Metformin Sulsonylurea (glipizide and glimipride tried but not in last 120 days) Pioglitazone  Please advise

## 2017-07-28 NOTE — Telephone Encounter (Signed)
Called [employer] and iniated over the phone. 24-72 hours response time.  Ref # [id number]

## 2017-07-30 MED FILL — METFORMIN HCL ER 500 MG TAB: 500 | 90 days supply | Qty: 360 | Fill #2

## 2017-08-02 MED FILL — TRULICITY 0.75 MG/0.5 ML PE: 0.75 | 28 days supply | Qty: 2 | Fill #0

## 2017-08-03 NOTE — Telephone Encounter (Signed)
PA for Trulicity approved, 12 refills from 07/27/17-07/26/18

## 2017-08-19 MED FILL — DESVENLAFAXINE SUC ER 100 M: 100 | 90 days supply | Qty: 90 | Fill #2

## 2017-08-24 MED FILL — TRESIBA FLEXTOUCH 200 UNITS: 200 | 30 days supply | Qty: 9 | Fill #5

## 2017-08-26 MED FILL — lamoTRIgine 150 MG TABS: 150 | 90 days supply | Qty: 90 | Fill #0

## 2017-08-26 MED FILL — lamoTRIgine 25 MG TABS: 25 | 90 days supply | Qty: 90 | Fill #0

## 2017-09-02 ENCOUNTER — Encounter: Payer: Self-pay | Admitting: Internal Medicine

## 2017-09-02 ENCOUNTER — Other Ambulatory Visit: Payer: Self-pay

## 2017-09-02 MED ORDER — DULAGLUTIDE 1.5 MG/0.5ML ~~LOC~~ SOAJ: SUBCUTANEOUS | 2 refills | Status: DC

## 2017-09-07 ENCOUNTER — Other Ambulatory Visit: Payer: Self-pay

## 2017-09-07 MED ORDER — DULAGLUTIDE 1.5 MG/0.5ML ~~LOC~~ SOAJ: SUBCUTANEOUS | 2 refills | Status: DC

## 2017-09-07 MED FILL — TRULICITY 1.5 MG/0.5 ML PEN: 1.5 | 28 days supply | Qty: 2 | Fill #0

## 2017-09-29 MED FILL — TRESIBA FLEXTOUCH 200 UNITS: 200 | 30 days supply | Qty: 9 | Fill #6

## 2017-10-01 ENCOUNTER — Other Ambulatory Visit: Payer: Self-pay | Admitting: Family Medicine

## 2017-10-01 MED FILL — SIMVASTATIN 40 MG TABLET: 40 | 90 days supply | Qty: 90 | Fill #1

## 2017-10-04 MED FILL — LISINOPRIL 10 MG TABS: 10 | 30 days supply | Qty: 30 | Fill #0

## 2017-10-04 MED FILL — TRULICITY 1.5 MG/0.5 ML PEN: 1.5 | 28 days supply | Qty: 2 | Fill #1

## 2017-10-29 ENCOUNTER — Ambulatory Visit: Payer: No Typology Code available for payment source | Admitting: Internal Medicine

## 2017-10-29 ENCOUNTER — Encounter: Payer: Self-pay | Admitting: Internal Medicine

## 2017-10-29 VITALS — BP 124/78 | HR 120 | Temp 97.7°F | Ht 67.5 in | Wt 282.8 lb

## 2017-10-29 DIAGNOSIS — Z794 Long term (current) use of insulin: Secondary | ICD-10-CM

## 2017-10-29 DIAGNOSIS — E113291 Type 2 diabetes mellitus with mild nonproliferative diabetic retinopathy without macular edema, right eye: Secondary | ICD-10-CM | POA: Diagnosis not present

## 2017-10-29 DIAGNOSIS — E785 Hyperlipidemia, unspecified: Secondary | ICD-10-CM | POA: Diagnosis not present

## 2017-10-29 LAB — POCT GLYCOSYLATED HEMOGLOBIN (HGB A1C): Hemoglobin A1C: 8.3

## 2017-10-29 MED ORDER — DULAGLUTIDE 1.5 MG/0.5ML ~~LOC~~ SOAJ: SUBCUTANEOUS | 3 refills | Status: DC

## 2017-10-29 MED ORDER — INSULIN DEGLUDEC 200 UNIT/ML ~~LOC~~ SOPN: 60.0000 [IU] | PEN_INJECTOR | Freq: Every day | SUBCUTANEOUS | 5 refills | Status: DC

## 2017-10-29 MED ORDER — METFORMIN HCL ER 500 MG PO TB24: 1000.0000 mg | ORAL_TABLET | Freq: Two times a day (BID) | ORAL | 3 refills | Status: DC

## 2017-10-29 MED FILL — METFORMIN HCL ER 500 MG TAB: 500 | 90 days supply | Qty: 360 | Fill #0

## 2017-10-29 MED FILL — TRULICITY 1.5 MG/0.5 ML PEN: 1.5 | 84 days supply | Qty: 6 | Fill #0

## 2017-10-29 MED FILL — TRESIBA FLEXTOUCH 200 UNITS: 200 | 30 days supply | Qty: 9 | Fill #7

## 2017-10-29 NOTE — Patient Instructions (Addendum)
Please move all Metformin with dinner: 2000 mg.  Take Tresiba 60 units at dinnertime, too.  Continue Trulicity 1.5 mg weekly.  Please return in 3 months with your sugar log.

## 2017-10-29 NOTE — Progress Notes (Signed)
Patient ID: Sarah Walls, female   DOB: 1961-05-11, 57 y.o.   MRN: [id number]   HPI: Sarah Walls is a 57 y.o.-year-old female, returning for f/u for DM2, dx in ~2010, insulin-dependent since ~2015-16, uncontrolled, with complications (mild DR). Last visit 3 months ago.  Before last visit, she was eating a more plant-based diet after reading Dr. Annamarie Dawley spoke (program for reversing diabetes).  Her sugars were better and she was feeling better.  However, at last visit, she relaxed her diet and sugars were much higher. She continues to eat some meat.  Last hemoglobin A1c was: Lab Results  Component Value Date   HGBA1C 9.4 07/23/2017   HGBA1C 7.6 02/12/2017   HGBA1C 8.8 11/27/2016   HGBA1C 9.2 (H) 09/15/2016   HGBA1C 8.5 (H) 04/27/2016   HGBA1C 8.6 (H) 01/27/2016   HGBA1C 9.2 (H) 10/04/2015   HGBA1C 10.0 (H) 06/28/2015   HGBA1C 7.9 (H) 03/11/2015   HGBA1C 10.0 (H) 10/31/2014   Pt was on a regimen of: - Metformin 500 mg 1x a day, with dinner. Higher doses >> diarrhea (she has IBS). - Januvia 100 mg daily with dinner - Lantus 75 units at bedtime - in the evening  She is now on: - Tresiba 60 units in the evening - Metformin ER 1000 mg 2x a day with meals - forgetting the second dose of the day - Trulicity 1.5 mg weekly - started 07/2017. We stopped Januvia when we started Trulicity.  Pt checks her sugars 1-2x a day: - am:  84-148, 153 >> 122-194, 212 >> 104-146, 203, 217 - 2h after b'fast: n/c >> 216, 245 - before lunch: n/c - 2h after lunch: n/c >> 264 >> n/c - before dinner: n/c >> 134-183 >> n/c - 2h after dinner: 235, 249 >> 172, 194, 233, 253 - bedtime: 112-183 >> 171-260, 302 >> 147-208, 307 - nighttime: n/c Lowest sugar was 122 >> 104; she has hypoglycemia awareness in the 70s. Highest sugar was 302 >> 307 (potato chips)  Glucometer: AccuChek  Pt's meals are: - Breakfast: b'fast sandwich + yoghurt; boiled eggs + slice of bacon + yoghurt - Lunch: frozen meal +/- salad -  Dinner: frozen meals - Snacks: zucchini bread, apple  She works night shift - 12h 3 times a week.  -No CKD, last BUN/creatinine:  Lab Results  Component Value Date   BUN 10 07/07/2016   BUN 7 04/27/2016   CREATININE 0.64 07/07/2016   CREATININE 0.58 04/27/2016  On  lisinopril. -+ HL; last set of lipids: Lab Results  Component Value Date   CHOL 186 04/27/2016   HDL 52.00 04/27/2016   LDLCALC 110 (H) 04/27/2016   LDLDIRECT 140.1 10/06/2011   TRIG 120.0 04/27/2016   CHOLHDL 4 04/27/2016  On Zocor 40. - last eye exam was in 2017: Mild DR.  She had cataract surgery in 2016. - no numbness and tingling in her feet.  ROS: Constitutional: no weight gain/no weight loss, no fatigue, no subjective hyperthermia, no subjective hypothermia Eyes: no blurry vision, no xerophthalmia ENT: no sore throat, no nodules palpated in throat, no dysphagia, no odynophagia, no hoarseness Cardiovascular: no CP/no SOB/no palpitations/no leg swelling Respiratory: no cough/no SOB/no wheezing Gastrointestinal: no N/no V/no D/no C/no acid reflux Musculoskeletal: no muscle aches/+ joint aches Skin: no rashes, no hair loss Neurological: no tremors/no numbness/no tingling/no dizziness  I reviewed pt's medications, allergies, PMH, social hx, family hx, and changes were documented in the history of present illness. Otherwise, unchanged from my initial visit  note.   Past Medical History:  Diagnosis Date  . Allergy   . Arthritis   . Bipolar disorder ([place])   . Cataract   . Depression    BAD2 - Dr. Albertine Patricia  . Diabetes mellitus without complication ([place])   . Hyperlipidemia   . Hypertension   . Panic attacks    BAD2 - Dr. Albertine Patricia  . Personal history of colonic polyps - adenomas 10/13/2013   12/2013 - 6 adenomas max 12 mm - repeat colonoscopy  2018   . Tuberculosis    positive PPD   Past Surgical History:  Procedure Laterality Date  . CATARACT EXTRACTION Bilateral 2015  . CESAREAN SECTION  1990   Fetal  Distress  . COLONOSCOPY    . EYE SURGERY    . POLYPECTOMY    . TONSILLECTOMY  1969   Social History   Social History  . Marital status: Married    Spouse name: N/A  . Number of children: 1   Occupational History  . 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    Social History Main Topics  . Smoking status: Never Smoker  . Smokeless tobacco: Never Used  . Alcohol use No  . Drug use: No   Social History Narrative   05/2009 Masters in counseling in disabled.   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       Mother lived in home.  (Deceased quickly Spring 09 from metastatic melanoma)   Current Outpatient Medications on File Prior to Visit  Medication Sig Dispense Refill  . ACCU-CHEK FASTCLIX LANCETS MISC Check blood 1 - 2 times daily and as directed. Dx E11.65 200 each 1  . ALPRAZolam (XANAX) 0.5 MG tablet Take 0.5 mg by mouth at bedtime as needed (for panic attacks.). May take up to 4 tablets per day as needed    . aspirin 81 MG tablet Take 81 mg by mouth daily.    . chlorhexidine (PERIDEX) 0.12 % solution   3  . desvenlafaxine (PRISTIQ) 100 MG 24 hr tablet Take 100 mg by mouth daily.    . Dulaglutide (TRULICITY) 1.5 WU/9.8JX SOPN Inject 1.5 mg into skin weekly 4 pen 2  . glucose blood (ACCU-CHEK GUIDE) test strip Check blood 1 - 2 times daily and as directed. Dx E11.65 200 each 3  . Insulin Degludec (TRESIBA FLEXTOUCH) 200 UNIT/ML SOPN Inject 60 Units into the skin daily at 2 PM. 6 pen 5  . Insulin Pen Needle (UNIFINE PENTIPS) 31G X 5 MM MISC USE DAILY WITH INSULIN PEN 100 each 4  . lamoTRIgine (LAMICTAL) 150 MG tablet Take 150 mg by mouth daily.    Marland Kitchen lamoTRIgine (LAMICTAL) 25 MG tablet Take 25 mg by mouth as needed.    Elmore Guise Device MISC Use to test blood sugar once or twice daily.  Diagnosis E11.65  Insulin dependent.  True Metrix 1 each 0  . lisinopril (PRINIVIL,ZESTRIL) 10 MG tablet TAKE 1 TABLET BY MOUTH DAILY.  30 tablet 0  . metFORMIN (GLUCOPHAGE-XR) 500 MG 24 hr tablet Take 2 tablets (1,000 mg total) by mouth 2 (two) times daily with a meal. 360 tablet 3  . Multiple Vitamin (MULTIVITAMIN) tablet Take 1 tablet by mouth daily.      . simvastatin (ZOCOR) 40 MG tablet TAKE 1 TABLET BY MOUTH AT BEDTIME. 90 tablet 1  . sitaGLIPtin (JANUVIA) 100 MG tablet Take 1 tablet (100 mg total) by mouth daily. 90 tablet 1  .  UNIFINE PENTIPS 31G X 5 MM MISC USE DAILY WITH INSULIN PEN 100 each 4   No current facility-administered medications on file prior to visit.    Allergies  Allergen Reactions  . Beta Adrenergic Blockers Other (See Comments)    Drowsy, fatigue   Family History  Problem Relation Age of Onset  . Cancer Mother        Metastatic liver CA  . Thyroid disease Mother        Hypothyroidism  . Vision loss Mother        Eye tumor, removed orbit at the end of 2004  . Melanoma Mother   . Diabetes Mother   . Diabetes Father   . Heart disease Father        CABG, coronary disease  . Thyroid disease Sister        Hypothyroidism  . Colon cancer Maternal Aunt 42  . Breast cancer Neg Hx   . Colon polyps Neg Hx   . Esophageal cancer Neg Hx   . Rectal cancer Neg Hx   . Stomach cancer Neg Hx    PE: BP 124/78 (BP Location: Left Arm, Patient Position: Sitting, Cuff Size: Large)   Pulse (!) 120   Temp 97.7 F (36.5 C) (Oral)   Ht 5' 7.5" (1.715 m)   Wt 282 lb 12.8 oz (128.3 kg)   LMP 08/28/2015 (Approximate)   SpO2 95%   BMI 43.64 kg/m  Wt Readings from Last 3 Encounters:  10/29/17 282 lb 12.8 oz (128.3 kg)  07/23/17 286 lb (129.7 kg)  06/25/17 289 lb (131.1 kg)   Constitutional: overweight, in NAD Eyes: PERRLA, EOMI, no exophthalmos ENT: moist mucous membranes, no thyromegaly, no cervical lymphadenopathy Cardiovascular: tachycardia  - chronic, RR, No MRG Respiratory: CTA B Gastrointestinal: abdomen soft, NT, ND, BS+ Musculoskeletal: no deformities, strength intact in all 4 Skin: moist,  warm, no rashes Neurological: no tremor with outstretched hands, DTR normal in all 4  ASSESSMENT: 1. 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 Obesity class 3  3. HL  PLAN:  1. Patient with long-standing, uncontrolled, type 2 diabetes, on oral antidiabetic regimen, basal insulin, and now GLP-1 receptor agonist added at last visit.  She improved her sugars last year after she transitioned to a more plant-based diet and we were able to decrease her insulin dose.  She then relax her diet and at last visit her sugars were higher so we replaced Januvia with Trulicity.  We did not increase the insulin dose at that time. - At this visit, sugars are better after we started Trulicity, but they are still above target.  She is forgetting the second daily dose of metformin, so we discussed about moving the entire metformin dose together at dinnertime, and also take [place] at the same time, whether she goes to bed in the morning or in the evening. - We also discussed about switching back to the plant-based diet, which had excellent effect on her blood sugars. - I suggested to:  Patient Instructions  Please move all Metformin with dinner: 2000 mg.  Take Tresiba 60 units at dinnertime, too.  Continue Trulicity 1.5 mg weekly.  Please return in 3 months with your sugar log.   - today, HbA1c is 8.3% (better) - continue checking sugars at different times of the day - check 1x a day, rotating checks - advised for yearly eye exams >> she is not UTD - Return to clinic in 3 mo with sugar log  2. Obesity class 3 - At last visit we discussed about the benefits of a plant-based diet and given her references. - She did lose a little weight after we started Trulicity at last visit  3. HL - Reviewed latest lipid panel from 04/2016: LDL better, but still above target - Continues Zocor without side effects. - She is due for new lipid panel -she will see PCP soon   Philemon Kingdom, MD PhD [facility] Endocrinology

## 2017-11-03 ENCOUNTER — Other Ambulatory Visit: Payer: Self-pay | Admitting: Family Medicine

## 2017-11-03 MED FILL — LISINOPRIL 10 MG TABS: 10 | 30 days supply | Qty: 30 | Fill #0

## 2017-11-30 ENCOUNTER — Ambulatory Visit (INDEPENDENT_AMBULATORY_CARE_PROVIDER_SITE_OTHER): Payer: No Typology Code available for payment source | Admitting: Family Medicine

## 2017-11-30 ENCOUNTER — Encounter: Payer: Self-pay | Admitting: Family Medicine

## 2017-11-30 DIAGNOSIS — J0100 Acute maxillary sinusitis, unspecified: Secondary | ICD-10-CM

## 2017-11-30 DIAGNOSIS — R000 Tachycardia, unspecified: Secondary | ICD-10-CM

## 2017-11-30 MED ORDER — METOPROLOL TARTRATE 25 MG PO TABS: 12.5000 mg | ORAL_TABLET | Freq: Every day | ORAL | 0 refills | Status: DC

## 2017-11-30 MED ORDER — AMOXICILLIN-POT CLAVULANATE 875-125 MG PO TABS: 1.0000 | ORAL_TABLET | Freq: Two times a day (BID) | ORAL | 0 refills | Status: DC

## 2017-11-30 MED ORDER — BENZONATATE 200 MG PO CAPS: 200.0000 mg | ORAL_CAPSULE | Freq: Three times a day (TID) | ORAL | 1 refills | Status: DC | PRN

## 2017-11-30 MED FILL — UNIFINE PENTIPS 31GX3/16": 31G X 5 MM | 90 days supply | Qty: 100 | Fill #1

## 2017-11-30 MED FILL — UNIFINE PENTIPS 31GX3/16: 31G X 5 MM | 90 days supply | Qty: 100 | Fill #1

## 2017-11-30 MED FILL — BENZONATATE 200 MG CAPS: 200 | 10 days supply | Qty: 30 | Fill #0

## 2017-11-30 MED FILL — AMOX TR-K CLV 875-125 MG TA: 875-125 | 10 days supply | Qty: 20 | Fill #0

## 2017-11-30 MED FILL — ACCU-CHEK GUIDE TEST STRIP: 90 days supply | Qty: 200 | Fill #1

## 2017-11-30 MED FILL — METOPROLOL TARTRATE 25 MG T: 25 | 30 days supply | Qty: 30 | Fill #0

## 2017-11-30 NOTE — Progress Notes (Signed)
She is still working nights.  She was out of work last night due to illness.    She has baseline HR 90-100 at rest.  She didn't tolerate metoprolol prev with BID dosing.  She had been taking decongestants.   D/w pt about options.  It may be that she has her baseline resting tachycardia that is slightly worse with acute illness and decongestant use.  Cough started about 2.5 weeks ago.  Inc pulse noted. Inc sugar on home check in the last few days.  Some vomiting last night.  Congestion and coughing continue.  "it feels like it is down in my chest."  Episodic fevers, not for the first 2 weeks but noted episodically in the last few days.  Cough was prev usually dry.  Not much sputum, but some darkish/brownish sputum last night.  Some prev clear sputum.  Some loose stools last night.  HA noted.  No rash.    Meds, vitals, and allergies reviewed.   ROS: Per HPI unless specifically indicated in ROS section   GEN: nad, alert and oriented HEENT: mucous membranes moist, tm w/o erythema, nasal exam w/o erythema, clear discharge noted,  OP with cobblestoning, max sinuses ttp B NECK: supple w/o LA CV: rrr.   PULM: ctab, no inc wob EXT: no edema SKIN: no acute rash

## 2017-11-30 NOTE — Patient Instructions (Addendum)
Start augmentin and try to get some rest.  Your lungs sound clear.   Update me as needed.  Drink plenty of fluids.   Tessalon for the cough.    When better, try taking metoprolol 1/2 tab prior to sleeping.  See if you can tolerate that and see if your heart rate decreases a little.   Out of work for now.   Take care.  Glad to see you.

## 2017-12-01 DIAGNOSIS — R000 Tachycardia, unspecified: Secondary | ICD-10-CM | POA: Insufficient documentation

## 2017-12-01 NOTE — Assessment & Plan Note (Signed)
Sounds to be regular on exam.  See above.  It may be that she has a baseline resting tachycardia that is slightly worse with acute illness and decongestant use.  She can try to taper off decongestants. When better and sinus infection is resolved she can try taking metoprolol 1/2 tab prior to sleeping.  She'll see if she can tolerate that and see if her heart rate decreases a little.  Update me as needed.

## 2017-12-01 NOTE — Assessment & Plan Note (Signed)
Nontoxic.  Okay for outpatient follow-up.  Discussed with patient about options. Start augmentin and try to get some rest.  Update me as needed.  Drink plenty of fluids.  Tessalon for the cough.

## 2017-12-02 MED FILL — DESVENLAFAXINE SUC ER 100 M: 100 | 90 days supply | Qty: 90 | Fill #0

## 2017-12-10 MED FILL — TRESIBA FLEXTOUCH 200 UNITS: 200 | 30 days supply | Qty: 9 | Fill #8

## 2017-12-20 ENCOUNTER — Other Ambulatory Visit: Payer: Self-pay | Admitting: Family Medicine

## 2017-12-20 MED FILL — lamoTRIgine 150 MG TABS: 150 | 90 days supply | Qty: 90 | Fill #1

## 2017-12-20 MED FILL — lamoTRIgine 25 MG TABS: 25 | 90 days supply | Qty: 90 | Fill #1

## 2017-12-20 NOTE — Telephone Encounter (Signed)
Ok to refill? Electronically refill request   Last prescribed on 11/03/2017  Last office visit on 11/30/2017 for acute

## 2017-12-21 MED FILL — LISINOPRIL 10 MG TABLET: 10 | 30 days supply | Qty: 30 | Fill #0 | Status: TO

## 2017-12-21 NOTE — Telephone Encounter (Signed)
Left message to call back to schedule-Sarah Walls V [name], RMA

## 2017-12-21 NOTE — Telephone Encounter (Signed)
Please schedule appointment as instructed. 

## 2017-12-21 NOTE — Telephone Encounter (Signed)
Sent. Thanks.  Needs CPE when possible. 

## 2017-12-27 NOTE — Telephone Encounter (Signed)
LMTCB and letter mailed to patient to call and schedule-Sarah Walls, RMA

## 2018-01-05 MED FILL — TRESIBA FLEXTOUCH 200 UNITS: 200 | 30 days supply | Qty: 9 | Fill #9

## 2018-01-07 ENCOUNTER — Encounter: Payer: Self-pay | Admitting: Family Medicine

## 2018-01-10 ENCOUNTER — Other Ambulatory Visit: Payer: Self-pay | Admitting: Family Medicine

## 2018-01-10 MED FILL — ALPRAZolam 1 MG TABS: 1 | 30 days supply | Qty: 90 | Fill #0

## 2018-02-14 ENCOUNTER — Encounter: Payer: Self-pay | Admitting: Family Medicine

## 2018-02-14 ENCOUNTER — Encounter: Payer: Self-pay | Admitting: Internal Medicine

## 2018-02-15 MED ORDER — ACCU-CHEK FASTCLIX LANCETS MISC: 1 refills | Status: AC

## 2018-02-15 MED ORDER — INSULIN PEN NEEDLE 31G X 5 MM MISC: 4 refills | Status: DC

## 2018-02-15 MED ORDER — METFORMIN HCL ER 500 MG PO TB24: 1000.0000 mg | ORAL_TABLET | Freq: Two times a day (BID) | ORAL | 3 refills | Status: DC

## 2018-02-15 MED ORDER — INSULIN DEGLUDEC 200 UNIT/ML ~~LOC~~ SOPN: 60.0000 [IU] | PEN_INJECTOR | Freq: Every day | SUBCUTANEOUS | 5 refills | Status: DC

## 2018-02-15 MED ORDER — DULAGLUTIDE 1.5 MG/0.5ML ~~LOC~~ SOAJ: SUBCUTANEOUS | 3 refills | Status: DC

## 2018-02-15 MED ORDER — GLUCOSE BLOOD VI STRP: ORAL_STRIP | 3 refills | Status: DC

## 2018-02-15 NOTE — Telephone Encounter (Signed)
Last office visit 11/30/17 Last lipid check 04/27/16 Patient should have refills on Metoprolol last refill 01/10/18 #30/5 Message about insurance coverage should probably go to [place].

## 2018-02-18 ENCOUNTER — Other Ambulatory Visit: Payer: Self-pay | Admitting: Internal Medicine

## 2018-02-20 ENCOUNTER — Other Ambulatory Visit: Payer: Self-pay | Admitting: Family Medicine

## 2018-02-20 MED ORDER — SIMVASTATIN 40 MG PO TABS: 40.0000 mg | ORAL_TABLET | Freq: Every day | ORAL | 1 refills | Status: DC

## 2018-02-20 MED ORDER — METOPROLOL TARTRATE 25 MG PO TABS: ORAL_TABLET | ORAL | 1 refills | Status: DC

## 2018-02-21 ENCOUNTER — Other Ambulatory Visit: Payer: Self-pay | Admitting: *Deleted

## 2018-02-21 MED ORDER — METOPROLOL TARTRATE 25 MG PO TABS: ORAL_TABLET | ORAL | 1 refills | Status: DC

## 2018-02-21 MED ORDER — SIMVASTATIN 40 MG PO TABS: 40.0000 mg | ORAL_TABLET | Freq: Every day | ORAL | 1 refills | Status: DC

## 2018-02-21 NOTE — Telephone Encounter (Signed)
Let's send Toujeo same dose.

## 2018-02-21 NOTE — Telephone Encounter (Signed)
Please advise on lantus, Levemir or Toujeo for pt, Sarah Walls is not covered. Pharmacy request

## 2018-02-22 ENCOUNTER — Telehealth: Payer: Self-pay

## 2018-02-22 NOTE — Telephone Encounter (Signed)
Trulicity is no longer covered for the pt, please advise if PA is needed.   Key: [id number]

## 2018-02-22 NOTE — Telephone Encounter (Signed)
OK, lets try

## 2018-02-23 NOTE — Telephone Encounter (Signed)
Sent PA today through [facility]

## 2018-03-04 ENCOUNTER — Ambulatory Visit (INDEPENDENT_AMBULATORY_CARE_PROVIDER_SITE_OTHER): Payer: BLUE CROSS/BLUE SHIELD | Admitting: Internal Medicine

## 2018-03-04 VITALS — BP 130/82 | HR 124 | Ht 66.54 in | Wt 277.0 lb

## 2018-03-04 DIAGNOSIS — Z23 Encounter for immunization: Secondary | ICD-10-CM

## 2018-03-04 DIAGNOSIS — Z794 Long term (current) use of insulin: Secondary | ICD-10-CM

## 2018-03-04 DIAGNOSIS — E785 Hyperlipidemia, unspecified: Secondary | ICD-10-CM

## 2018-03-04 DIAGNOSIS — E113291 Type 2 diabetes mellitus with mild nonproliferative diabetic retinopathy without macular edema, right eye: Secondary | ICD-10-CM

## 2018-03-04 LAB — MICROALBUMIN / CREATININE URINE RATIO
CREATININE, U: 175.8 mg/dL
Microalb Creat Ratio: 5.1 mg/g (ref 0.0–30.0)
Microalb, Ur: 8.9 mg/dL — ABNORMAL HIGH (ref 0.0–1.9)

## 2018-03-04 LAB — LIPID PANEL
CHOL/HDL RATIO: 4
CHOLESTEROL: 181 mg/dL (ref 0–200)
HDL: 40.9 mg/dL (ref >39.00)
LDL CALC: 108 mg/dL — AB (ref 0–99)
NonHDL: 140.18
TRIGLYCERIDES: 163 mg/dL — AB (ref 0.0–149.0)
VLDL: 32.6 mg/dL (ref 0.0–40.0)

## 2018-03-04 LAB — POCT GLYCOSYLATED HEMOGLOBIN (HGB A1C): HEMOGLOBIN A1C: 8.1 % — AB (ref 4.0–5.6)

## 2018-03-04 MED ORDER — EMPAGLIFLOZIN 25 MG PO TABS: 25.0000 mg | ORAL_TABLET | Freq: Every day | ORAL | 5 refills | Status: DC

## 2018-03-04 NOTE — Progress Notes (Addendum)
Patient ID: Sarah Walls, female   DOB: [birth year], 57 y.o.   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 Last visit 4 mo ago.  Since last visit >> she started to work days, from home.  She feels and sleeps better.  Her husband is also working from home.  Stress is higher but improving.  Last hemoglobin A1c was: Lab Results  Component Value Date   HGBA1C 8.3 10/29/2017   HGBA1C 9.4 07/23/2017   HGBA1C 7.6 02/12/2017   HGBA1C 8.8 11/27/2016   HGBA1C 9.2 (H) 09/15/2016   HGBA1C 8.5 (H) 04/27/2016   HGBA1C 8.6 (H) 01/27/2016   HGBA1C 9.2 (H) 10/04/2015   HGBA1C 10.0 (H) 06/28/2015   HGBA1C 7.9 (H) 03/11/2015   Pt was on a regimen of: - Metformin 500 mg 1x a day, with dinner. Higher doses >> diarrhea (she has IBS). - Januvia 100 mg daily with dinner - Lantus 75 units at bedtime - in the evening  She is now on: -Metformin 2000 mg with dinner -Trulicity 1.5 mg weekly in a.m. -Tyler Aas 60 units in the evening >> Levemir 60 units at nigh We stopped Januvia when we started Trulicity.  Pt checks her sugars 1-2x a day: - am:122-194, 212 >> 104-146, 203, 217 >> 127-181, 193 - 2h after b'fast: n/c >> 216, 245 >> 189 - before lunch: n/c >> 213 (snacking) - 2h after lunch: n/c >> 264 >> n/c - before dinner: n/c >> 134-183 >> n/c - 2h after dinner: 235, 249 >> 172, 194, 233, 253 >> 220 - bedtime: 171-260, 302 >> 147-208, 307 >> 170-223 - nighttime: n/c Lowest sugar was 122 >> 104 >> 127; she has hypoglycemia awareness in the 70s. Highest sugar was 302 >> 307 (potato chips) >> 147 (off Trulicity)  Glucometer: AccuChek  Pt's meals are: - Breakfast: b'fast sandwich + yoghurt; boiled eggs + slice of bacon + yoghurt - Lunch: frozen meal +/- salad - Dinner: frozen meals - Snacks: zucchini bread, apple  She works night shift - 12h 3 times a week.  -No  CKD, last BUN/creatinine:  Lab Results  Component Value Date   BUN 10 07/07/2016   BUN 7 04/27/2016   CREATININE 0.64 07/07/2016   CREATININE 0.58 04/27/2016  On  lisinopril. -+ HL; last set of lipids: Lab Results  Component Value Date   CHOL 186 04/27/2016   HDL 52.00 04/27/2016   LDLCALC 110 (H) 04/27/2016   LDLDIRECT 140.1 10/06/2011   TRIG 120.0 04/27/2016   CHOLHDL 4 04/27/2016  On Zocor 40. - last eye exam was in 2017: + Mild DR.  She has a history of cataract surgery in 2016 -No numbness and tingling in her feet.  ROS: Constitutional: + Weight loss, + fatigue, no subjective hyperthermia, no subjective hypothermia Eyes: no blurry vision, no xerophthalmia ENT: no sore throat, no nodules palpated in throat, no dysphagia, no odynophagia, no hoarseness Cardiovascular: no CP/no SOB/no palpitations/+ leg swelling Respiratory: no cough/no SOB/no wheezing Gastrointestinal: She had nausea, vomiting, diarrhea for several days, but resolved. Musculoskeletal: no muscle aches/no joint aches Skin: no rashes, no hair loss Neurological: no tremors/no numbness/no tingling/no dizziness  I reviewed pt's medications, allergies, PMH, social hx, family hx, and changes were documented in the history of present illness. Otherwise, unchanged from my initial visit note.   Past Medical History:  Diagnosis Date  . Allergy   .  Arthritis   . Bipolar disorder ([place])   . Cataract   . Depression    BAD2 - Dr. Albertine Patricia  . Diabetes mellitus without complication ([place])   . Hyperlipidemia   . Hypertension   . Panic attacks    BAD2 - Dr. Albertine Patricia  . Personal history of colonic polyps - adenomas 10/13/2013   12/2013 - 6 adenomas max 12 mm - repeat colonoscopy  2018   . Tuberculosis    positive PPD   Past Surgical History:  Procedure Laterality Date  . CATARACT EXTRACTION Bilateral 2015  . CESAREAN SECTION  1990   Fetal Distress  . COLONOSCOPY    . EYE SURGERY    . POLYPECTOMY    . TONSILLECTOMY   1969   Social History   Social History  . Marital status: Married    Spouse name: N/A  . Number of children: 1   Occupational History  . 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    Social History Main Topics  . Smoking status: Never Smoker  . Smokeless tobacco: Never Used  . Alcohol use No  . Drug use: No   Social History Narrative   05/2009 Masters in counseling in disabled.   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       Mother lived in home.  (Deceased quickly Spring 09 from metastatic melanoma)   Current Outpatient Medications on File Prior to Visit  Medication Sig Dispense Refill  . ACCU-CHEK FASTCLIX LANCETS MISC Check blood 1 - 2 times daily and as directed. Dx E11.65 200 each 1  . ALPRAZolam (XANAX) 0.5 MG tablet Take 0.5 mg by mouth at bedtime as needed (for panic attacks.). May take up to 4 tablets per day as needed    . amoxicillin-clavulanate (AUGMENTIN) 875-125 MG tablet Take 1 tablet by mouth 2 (two) times daily. 20 tablet 0  . aspirin 81 MG tablet Take 81 mg by mouth daily.    . benzonatate (TESSALON) 200 MG capsule Take 1 capsule (200 mg total) by mouth 3 (three) times daily as needed. 30 capsule 1  . chlorhexidine (PERIDEX) 0.12 % solution   3  . desvenlafaxine (PRISTIQ) 100 MG 24 hr tablet Take 100 mg by mouth daily.    . Dulaglutide (TRULICITY) 1.5 LK/4.4WN SOPN Inject 1.5 mg into skin weekly 12 pen 3  . glucose blood (ACCU-CHEK GUIDE) test strip Check blood 1 - 2 times daily and as directed. Dx E11.65 200 each 3  . Insulin Detemir (LEVEMIR FLEXTOUCH) 100 UNIT/ML Pen Inject 60 Units into the skin daily. 54 mL 0  . Insulin Pen Needle (UNIFINE PENTIPS) 31G X 5 MM MISC USE DAILY WITH INSULIN PEN 100 each 4  . Insulin Pen Needle (UNIFINE PENTIPS) 31G X 5 MM MISC USE DAILY WITH INSULIN PEN 100 each 4  . lamoTRIgine (LAMICTAL) 150 MG tablet Take 150 mg by mouth daily.    Marland Kitchen lamoTRIgine (LAMICTAL) 25 MG  tablet Take 25 mg by mouth as needed.    Elmore Guise Device MISC Use to test blood sugar once or twice daily.  Diagnosis E11.65  Insulin dependent.  True Metrix 1 each 0  . lisinopril (PRINIVIL,ZESTRIL) 10 MG tablet TAKE 1 TABLET BY MOUTH ONCE DAILY (NEED ANNUAL PHYSICAL FOR FURTHER REFILLS) 30 tablet 2  . metFORMIN (GLUCOPHAGE-XR) 500 MG 24 hr tablet Take 2 tablets (1,000 mg total) by mouth 2 (two) times daily with a meal. 360 tablet 3  .  metoprolol tartrate (LOPRESSOR) 25 MG tablet TAKE 0.5 TABLETS BY MOUTH DAILY. 45 tablet 1  . Multiple Vitamin (MULTIVITAMIN) tablet Take 1 tablet by mouth daily.      . simvastatin (ZOCOR) 40 MG tablet Take 1 tablet (40 mg total) by mouth at bedtime. 90 tablet 1   No current facility-administered medications on file prior to visit.    Allergies  Allergen Reactions  . Beta Adrenergic Blockers Other (See Comments)    Drowsy, fatigue   Family History  Problem Relation Age of Onset  . Cancer Mother        Metastatic liver CA  . Thyroid disease Mother        Hypothyroidism  . Vision loss Mother        Eye tumor, removed orbit at the end of 2004  . Melanoma Mother   . Diabetes Mother   . Diabetes Father   . Heart disease Father        CABG, coronary disease  . Thyroid disease Sister        Hypothyroidism  . Colon cancer Maternal Aunt 74  . Breast cancer Neg Hx   . Colon polyps Neg Hx   . Esophageal cancer Neg Hx   . Rectal cancer Neg Hx   . Stomach cancer Neg Hx    PE: BP 130/82   Pulse (!) 124   Ht 5' 6.54" (1.69 m)   Wt 277 lb (125.6 kg)   LMP 08/28/2015 (Approximate)   SpO2 97%   BMI 43.99 kg/m  Wt Readings from Last 3 Encounters:  03/04/18 277 lb (125.6 kg)  11/30/17 279 lb 12 oz (126.9 kg)  10/29/17 282 lb 12.8 oz (128.3 kg)   Constitutional: overweight, in NAD Eyes: PERRLA, EOMI, no exophthalmos ENT: moist mucous membranes, no thyromegaly, no cervical lymphadenopathy Cardiovascular: tachycardia (chronic), RR, No MRG Respiratory:  CTA B Gastrointestinal: abdomen soft, NT, ND, BS+ Musculoskeletal: no deformities, strength intact in all 4 Skin: moist, warm, no rashes Neurological: no tremor with outstretched hands, DTR normal in all 4  ASSESSMENT: 1. 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 Obesity class 3  3. HL  PLAN:  1. Patient with long-standing, uncontrolled, type 2 diabetes, on oral antidiabetic regimen, basal insulin, and GLP-1 receptor agonist.  Last year, she did better on a more plant-based diet and we were able to decrease her insulin doses.  However, since then, she relaxed her diet and her sugars increased.  We replace Januvia with Trulicity then.  At last visit, we moved the entire dose of metformin at night as her sugars were higher in the morning.  I again advised her to try to go back to the plant-based diet, which had excellent effect on her blood sugars. - Since last visit, she switched to working days and she feels much better.  Her sugars are slightly improved, but they are still above target, especially after dinner.  We discussed about adding an SGLT2 inhibitor.  I advised her about benefits and possible side effects.  Advised her to stay very well-hydrated.  Given her a coupon card.  At next visit, I hope we can start reducing her insulin dose. - I suggested to:  Patient Instructions  Please continue: -Metformin 2000 mg with dinner -Trulicity 1.5 mg weekly in a.m. -Levemir 60 units in the evening  Please start: - Jardiance 25 mg daily before b'fast  Please return in 3 months with your sugar log.   - today, HbA1c is  8.1% (slightly better) - continue checking sugars at different times of the day - check 1x a day, rotating checks - advised for yearly eye exams >> she is not UTD -We will check annual labs today. - Return to clinic in 3 mo with sugar log     2. Obesity class 3 -Last few pounds since last visit -We will continue Trulicity, which should also  help with weight loss -We will add Jardiance, which should also help with weight loss  3. HL - Reviewed latest lipid panel from 04/2016: LDL improved, but still above goal Lab Results  Component Value Date   CHOL 186 04/27/2016   HDL 52.00 04/27/2016   LDLCALC 110 (H) 04/27/2016   LDLDIRECT 140.1 10/06/2011   TRIG 120.0 04/27/2016   CHOLHDL 4 04/27/2016  - Continues Zocor without side effects. - She is due for new lipid panel >> we will check today  Office Visit on 03/04/2018  Component Date Value Ref Range Status  . Microalb, Ur 03/04/2018 8.9* 0.0 - 1.9 mg/dL Final  . Creatinine,U 03/04/2018 175.8  mg/dL Final  . Microalb Creat Ratio 03/04/2018 5.1  0.0 - 30.0 mg/g Final  . Glucose, Bld 03/04/2018 225* 65 - 99 mg/dL Final   Comment: .            Fasting reference interval . For someone without known diabetes, a glucose value >125 mg/dL indicates that they may have diabetes and this should be confirmed with a follow-up test. .   . BUN 03/04/2018 8  7 - 25 mg/dL Final  . Creat 03/04/2018 0.58  0.50 - 1.05 mg/dL Final   Comment: For patients >79 years of age, the reference limit for Creatinine is approximately 13% higher for people identified as African-American. .   . GFR, Est Non African American 03/04/2018 101  > OR = 60 mL/min/1.60m2 Final  . GFR, Est African American 03/04/2018 117  > OR = 60 mL/min/1.73m2 Final  . BUN/Creatinine Ratio 54/02/8118 NOT APPLICABLE  6 - 22 (calc) Final  . Sodium 03/04/2018 139  135 - 146 mmol/L Final  . Potassium 03/04/2018 5.0  3.5 - 5.3 mmol/L Final  . Chloride 03/04/2018 103  98 - 110 mmol/L Final  . CO2 03/04/2018 24  20 - 32 mmol/L Final  . Calcium 03/04/2018 10.4  8.6 - 10.4 mg/dL Final  . Total Protein 03/04/2018 7.1  6.1 - 8.1 g/dL Final  . Albumin 03/04/2018 4.7  3.6 - 5.1 g/dL Final  . Globulin 03/04/2018 2.4  1.9 - 3.7 g/dL (calc) Final  . AG Ratio 03/04/2018 2.0  1.0 - 2.5 (calc) Final  . Total Bilirubin 03/04/2018 0.4   0.2 - 1.2 mg/dL Final  . Alkaline phosphatase (APISO) 03/04/2018 60  33 - 130 U/L Final  . AST 03/04/2018 42* 10 - 35 U/L Final  . ALT 03/04/2018 50* 6 - 29 U/L Final  . Cholesterol 03/04/2018 181  0 - 200 mg/dL Final   ATP III Classification       Desirable:  < 200 mg/dL               Borderline High:  200 - 239 mg/dL          High:  > = 240 mg/dL  . Triglycerides 03/04/2018 163.0* 0.0 - 149.0 mg/dL Final   Normal:  <150 mg/dLBorderline High:  150 - 199 mg/dL  . HDL 03/04/2018 40.90  >39.00 mg/dL Final  . VLDL 03/04/2018 32.6  0.0 - 40.0  mg/dL Final  . LDL Cholesterol 03/04/2018 108* 0 - 99 mg/dL Final  . Total CHOL/HDL Ratio 03/04/2018 4   Final                  Men          Women1/2 Average Risk     3.4          3.3Average Risk          5.0          4.42X Average Risk          9.6          7.13X Average Risk          15.0          11.0                      . NonHDL 03/04/2018 140.18   Final   NOTE:  Non-HDL goal should be 30 mg/dL higher than patient's LDL goal (i.e. LDL goal of < 70 mg/dL, would have non-HDL goal of < 100 mg/dL)  . Hemoglobin A1C 03/04/2018 8.1* 4.0 - 5.6 % Final   ACR normal, glucose high, normal kidney function, elevated AST and ALT, elevated triglycerides, LDL also above goal.  Will advise her to discuss with PCP about her elevated LFTs.   Philemon Kingdom, MD PhD [facility] Endocrinology

## 2018-03-04 NOTE — Patient Instructions (Addendum)
Please continue: -Metformin 2000 mg with dinner -Trulicity 1.5 mg weekly in a.m. -Levemir 60 units in the evening  Please start: - Jardiance 25 mg daily before b'fast  Please return in 3 months with your sugar log.

## 2018-03-05 LAB — COMPLETE METABOLIC PANEL WITH GFR
AG Ratio: 2 (calc) (ref 1.0–2.5)
ALKALINE PHOSPHATASE (APISO): 60 U/L (ref 33–130)
ALT: 50 U/L — AB (ref 6–29)
AST: 42 U/L — AB (ref 10–35)
Albumin: 4.7 g/dL (ref 3.6–5.1)
BUN: 8 mg/dL (ref 7–25)
CALCIUM: 10.4 mg/dL (ref 8.6–10.4)
CO2: 24 mmol/L (ref 20–32)
CREATININE: 0.58 mg/dL (ref 0.50–1.05)
Chloride: 103 mmol/L (ref 98–110)
GFR, EST NON AFRICAN AMERICAN: 101 mL/min/{1.73_m2} (ref >=60)
GFR, Est African American: 117 mL/min/{1.73_m2} (ref >=60)
GLOBULIN: 2.4 g/dL (ref 1.9–3.7)
GLUCOSE: 225 mg/dL — AB (ref 65–99)
Potassium: 5 mmol/L (ref 3.5–5.3)
Sodium: 139 mmol/L (ref 135–146)
Total Bilirubin: 0.4 mg/dL (ref 0.2–1.2)
Total Protein: 7.1 g/dL (ref 6.1–8.1)

## 2018-03-07 ENCOUNTER — Encounter: Payer: Self-pay | Admitting: Internal Medicine

## 2018-06-07 DIAGNOSIS — F3181 Bipolar II disorder: Secondary | ICD-10-CM | POA: Diagnosis not present

## 2018-06-07 DIAGNOSIS — F3341 Major depressive disorder, recurrent, in partial remission: Secondary | ICD-10-CM | POA: Diagnosis not present

## 2018-06-17 ENCOUNTER — Ambulatory Visit: Payer: BLUE CROSS/BLUE SHIELD | Admitting: Internal Medicine

## 2018-06-21 ENCOUNTER — Other Ambulatory Visit: Payer: Self-pay | Admitting: Family Medicine

## 2018-06-29 ENCOUNTER — Other Ambulatory Visit: Payer: Self-pay

## 2018-06-29 ENCOUNTER — Encounter: Payer: Self-pay | Admitting: Family Medicine

## 2018-06-29 ENCOUNTER — Ambulatory Visit (INDEPENDENT_AMBULATORY_CARE_PROVIDER_SITE_OTHER): Payer: BLUE CROSS/BLUE SHIELD | Admitting: Family Medicine

## 2018-06-29 VITALS — BP 132/80 | HR 116 | Temp 98.3°F | Wt 277.0 lb

## 2018-06-29 DIAGNOSIS — E785 Hyperlipidemia, unspecified: Secondary | ICD-10-CM | POA: Diagnosis not present

## 2018-06-29 DIAGNOSIS — E113291 Type 2 diabetes mellitus with mild nonproliferative diabetic retinopathy without macular edema, right eye: Secondary | ICD-10-CM | POA: Diagnosis not present

## 2018-06-29 DIAGNOSIS — Z794 Long term (current) use of insulin: Secondary | ICD-10-CM

## 2018-06-29 DIAGNOSIS — R000 Tachycardia, unspecified: Secondary | ICD-10-CM

## 2018-06-29 MED ORDER — ATORVASTATIN CALCIUM 40 MG PO TABS: 40.0000 mg | ORAL_TABLET | Freq: Every day | ORAL | 3 refills | Status: DC

## 2018-06-29 NOTE — Patient Instructions (Signed)
It was so wonderful meeting you today!  I am going to switch your simvastatin to atorvastatin for your cholesterol.  We will also hold off on restarting metoprolol for your heart rate right now since this is not been bothering you.  Please feel free to call the office if you have any questions or concerns.  I like to see you back in about 3 months for regular follow-up, sooner if needed.

## 2018-06-29 NOTE — Progress Notes (Signed)
Subjective:    Patient ID: Sarah Walls, female    DOB: 11-Feb-1961, 58 y.o.   MRN: [id number]   HPI: [name] is a 58 year old female presenting to establish care and discuss the following:  She is presenting to establish care today, was not able to see her previous primary care provider anymore due to change in insurance.  Her son is also a patient at our clinic.  Bipolar II /anxiety: States this is been well controlled for several years on Lamictal and Pristiq.  She follows regularly with her psychiatrist.  She also has as needed Xanax from her psychiatrist as she used to have several panic attacks, however has not had 1 in the last year or so.  Rarely using her Xanax now.  Of note, she does state she is more stressed than usual with her change in job.  She is now working for [employer] at home, she is part of a new division which is making things more stressful, however is hopeful this is going to get better.  Currently does not have a stress outlet.  Diabetes type 2: Recent A1c 8.1% in 02/2018. Currently being controlled on Jardiance, Trulicity, Levemir, and metformin.  She endorses "some" compliance with medication, however is concerned with her change of insurance that these have become more expensive.  Fears she will not be able to afford her Trulicity/Jardiance much longer.  She feels she has a balanced diet, however now she is not as active as she could be.  Her husband has become very active at the gym and is continuing to encourage her to exercise.  She follows with Dr. Cruzita Lederer at [facility] for her diabetes.  Hyperlipidemia: Has been taking simvastatin without concerns.  Denies any myalgias.   Sinus tachycardia: She has a history of sinus tachycardia for several years, heart rate generally ranging around the 110s.  Previously symptomatic and put on metoprolol, however she has not taken this in over 2 months.  Currently denies any chest pain, shortness of breath,  lightheadedness/dizziness, or palpitations.  States she "does not even notice ".   Smoking status reviewed  Review of Systems Per HPI, also denies recent illness, fever, headache, changes in vision, chest pain, shortness of breath, abdominal pain, N/V/D, weakness   Patient Active Problem List   Diagnosis Date Noted  . Tachycardia 12/01/2017  . Class 3 severe obesity with serious comorbidity in adult ([place]) 02/12/2017  . Gastroenteritis 07/08/2016  . HLD (hyperlipidemia) 05/16/2016  . Vitamin D deficiency 03/09/2015  . Vertigo 11/01/2014  . Personal history of colonic polyps - adenomas 10/13/2013  . Acute non-recurrent maxillary sinusitis 03/15/2013  . 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 AC joint pain 08/13/2011  . Back pain 11/20/2010  . Bipolar disorder ([place]) 06/05/2009  . 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 GERD 02/29/2008  . KNEE PAIN, BILATERAL 12/29/2006  . Essential hypertension 11/04/2006  . IBS 11/04/2006     Objective:  BP 132/80   Pulse (!) 116   Temp 98.3 F (36.8 C) (Oral)   Wt 277 lb (125.6 kg)   LMP 08/28/2015 (Approximate)   SpO2 94%   BMI 43.99 kg/m  Vitals and nursing note reviewed  General: NAD, pleasant Cardiac: Tachycardic, but with regular rhythm, normal heart sounds Respiratory: CTAB, normal effort Abdomen: soft, nontender, nondistended Extremities: no edema or cyanosis. WWP. Skin: warm and dry, no rashes noted Neuro: alert and oriented, no focal deficits  Psych: normal affect  Assessment & Plan:    HLD (hyperlipidemia) LDL 108 in 02/2018.  Will switch simvastatin to high intensity atorvastatin 40 mg daily.  Also discussed addition of exercise into her daily regimen, patient amenable to this.   Tachycardia Sinus, asymptomatic.  She has not been taking her metoprolol in over 2 months, will consider re-adding in the future if necessary.  Type 2 DM w/mild nonproliferative diabetic retinop  w/o macular edema ([facility]) Uncontrolled, A1c 8.1 in 02/2018.  Follows with [facility] endocrinology.  Encouraged her to discuss cost difficulties with her Trulicity and Jardiance with her endocrinologist prior to discontinuing these medications on her own if she cannot afford them any further. - We will request ophthalmology exam records on next visit - Continue following with [place] endocrinology  Follow-up in approximately 3 months for annual evaluation, sooner if needed.  At that time will request ophthalmology and mammogram results.  Darrelyn Hillock, DO Family Medicine Resident PGY-1

## 2018-07-01 NOTE — Assessment & Plan Note (Addendum)
Sinus, asymptomatic.  She has not been taking her metoprolol in over 2 months, will consider re-adding in the future if necessary.

## 2018-07-01 NOTE — Assessment & Plan Note (Signed)
LDL 108 in 02/2018.  Will switch simvastatin to high intensity atorvastatin 40 mg daily.  Also discussed addition of exercise into her daily regimen, patient amenable to this.

## 2018-07-01 NOTE — Assessment & Plan Note (Signed)
Uncontrolled, A1c 8.1 in 02/2018.  Follows with [facility] endocrinology.  Encouraged her to discuss cost difficulties with her Trulicity and Jardiance with her endocrinologist prior to discontinuing these medications on her own if she cannot afford them any further. - We will request ophthalmology exam records on next visit - Continue following with [facility] endocrinology

## 2018-07-05 ENCOUNTER — Other Ambulatory Visit: Payer: Self-pay | Admitting: *Deleted

## 2018-07-05 ENCOUNTER — Other Ambulatory Visit: Payer: Self-pay | Admitting: Family Medicine

## 2018-07-05 ENCOUNTER — Encounter: Payer: Self-pay | Admitting: *Deleted

## 2018-07-05 MED ORDER — LISINOPRIL 10 MG PO TABS: ORAL_TABLET | ORAL | 0 refills | Status: DC

## 2018-08-10 ENCOUNTER — Encounter: Payer: Self-pay | Admitting: Internal Medicine

## 2018-08-10 ENCOUNTER — Ambulatory Visit (INDEPENDENT_AMBULATORY_CARE_PROVIDER_SITE_OTHER): Payer: BLUE CROSS/BLUE SHIELD | Admitting: Internal Medicine

## 2018-08-10 ENCOUNTER — Other Ambulatory Visit: Payer: Self-pay

## 2018-08-10 VITALS — BP 148/90 | HR 110 | Ht 67.0 in | Wt 269.0 lb

## 2018-08-10 DIAGNOSIS — E113299 Type 2 diabetes mellitus with mild nonproliferative diabetic retinopathy without macular edema, unspecified eye: Secondary | ICD-10-CM

## 2018-08-10 DIAGNOSIS — E785 Hyperlipidemia, unspecified: Secondary | ICD-10-CM | POA: Diagnosis not present

## 2018-08-10 DIAGNOSIS — Z794 Long term (current) use of insulin: Secondary | ICD-10-CM

## 2018-08-10 LAB — POCT GLYCOSYLATED HEMOGLOBIN (HGB A1C): Hemoglobin A1C: 10.1 % — AB (ref 4.0–5.6)

## 2018-08-10 MED ORDER — INSULIN DETEMIR 100 UNIT/ML FLEXPEN: 70.0000 [IU] | PEN_INJECTOR | Freq: Every day | SUBCUTANEOUS | 3 refills | Status: DC

## 2018-08-10 MED ORDER — DULAGLUTIDE 1.5 MG/0.5ML ~~LOC~~ SOAJ: SUBCUTANEOUS | 3 refills | Status: DC

## 2018-08-10 NOTE — Patient Instructions (Addendum)
Please continue: - Metformin 2000 mg with dinner - Jardiance 25 mg before breakfast - Trulicity 1.5 mg weekly in a.m. - Levemir 70 units in the evening  Please return in 3 months with your sugar log.

## 2018-08-10 NOTE — Progress Notes (Signed)
Patient ID: Sarah Walls, female   DOB: 1961-05-11, 58 y.o.   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 Last visit 5 months ago.  Before last visit she started to work days, and also working from home.  She was feeling and sleeping better. However, now has to change her job and is very stressed.  She has an interview later today.  She is also entertaining the idea of returning to work for [employer], however, in this case, she will again be working nights.  Since last visit, she had a lot of problems affording her meds >> was on and off the meds since last OV, now on all of them, but misses many doses.  She also relaxed her diet since last visit.  Sugars are quite a bit higher.  Last hemoglobin A1c was: Lab Results  Component Value Date   HGBA1C 8.1 (A) 03/04/2018   HGBA1C 8.3 10/29/2017   HGBA1C 9.4 07/23/2017   HGBA1C 7.6 02/12/2017   HGBA1C 8.8 11/27/2016   HGBA1C 9.2 (H) 09/15/2016   HGBA1C 8.5 (H) 04/27/2016   HGBA1C 8.6 (H) 01/27/2016   HGBA1C 9.2 (H) 10/04/2015   HGBA1C 10.0 (H) 06/28/2015   Pt was on a regimen of: - Metformin 500 mg 1x a day, with dinner. Higher doses >> diarrhea (she has IBS). - Januvia 100 mg daily with dinner - Lantus 75 units at bedtime - in the evening  She is on: - Metformin 2000 mg with dinner - missing doses - Jardiance 25 mg before breakfast - started 81/0175 - Trulicity 1.5 mg weekly in a.m. - Tresiba (too expensive) >> Levemir 60 units in the evening We stopped Januvia when we started Trulicity.  Pt checks her sugars 1-2 times a day: - am: 104-146, 203, 217 >> 127-181, 193 >> 155-190 - 2h after b'fast: n/c >> 216, 245 >> 189 >> 199-249 - before lunch: n/c >> 213 (snacking) > 204-211 - 2h after lunch: n/c >> 264 >> n/c >> 226-248, 328 - before dinner: n/c >> 134-183 >> n/c >> 189 - 2h after dinner172, 194,  233, 253 >> 220 >> n/c - bedtime: 147-208, 307 >> 170-223 >> n/c - nighttime: n/c Lowest sugar was 127 >> 155; she has hypoglycemia awareness in the 70s. Highest sugar was 102 (off Trulicity) >> 585.  Glucometer: AccuChek  Pt's meals are: - Breakfast: b'fast sandwich + yoghurt; boiled eggs + slice of bacon + yoghurt - Lunch: frozen meal +/- salad - Dinner: frozen meals - Snacks: zucchini bread, apple  -No CKD, last BUN/creatinine:  Lab Results  Component Value Date   BUN 8 03/04/2018   BUN 10 07/07/2016   CREATININE 0.58 03/04/2018   CREATININE 0.64 07/07/2016  On lisinopril. -+ HL; last set of lipids: Lab Results  Component Value Date   CHOL 181 03/04/2018   HDL 40.90 03/04/2018   LDLCALC 108 (H) 03/04/2018   LDLDIRECT 140.1 10/06/2011   TRIG 163.0 (H) 03/04/2018   CHOLHDL 4 03/04/2018  On Zocor 40 >> Lipitor 40 now. - last eye exam was in 2017: + Mild TR.  She has a history of cataract surgery in 2016 -She denies numbness and tingling in her feet.  ROS: Constitutional: + weight loss, no fatigue, no subjective hyperthermia, no subjective hypothermia Eyes: no blurry vision, no xerophthalmia ENT: no sore throat, no nodules palpated in neck, no dysphagia,  no odynophagia, no hoarseness Cardiovascular: no CP/no SOB/no palpitations/no leg swelling Respiratory: no cough/no SOB/no wheezing Gastrointestinal: no N/no V/no D/no C/no acid reflux Musculoskeletal: no muscle aches/+ joint aches (knee) Skin: no rashes, no hair loss Neurological: no tremors/no numbness/no tingling/no dizziness  I reviewed pt's medications, allergies, PMH, social hx, family hx, and changes were documented in the history of present illness. Otherwise, unchanged from my initial visit note. She stopped Metoprolol.   Past Medical History:  Diagnosis Date  . Allergy   . Arthritis   . Bipolar disorder ([place])   . Cataract   . Depression    BAD2 - Dr. Albertine Patricia  . Diabetes mellitus without complication  ([place])   . Hyperlipidemia   . Hypertension   . Panic attacks    BAD2 - Dr. Albertine Patricia  . Personal history of colonic polyps - adenomas 10/13/2013   12/2013 - 6 adenomas max 12 mm - repeat colonoscopy  2018   . Tuberculosis    positive PPD   Past Surgical History:  Procedure Laterality Date  . CATARACT EXTRACTION Bilateral 2015  . CESAREAN SECTION  1990   Fetal Distress  . COLONOSCOPY    . EYE SURGERY    . POLYPECTOMY    . TONSILLECTOMY  1969   Social History   Social History  . Marital status: Married    Spouse name: N/A  . Number of children: 1   Occupational History  . 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    Social History Main Topics  . Smoking status: Never Smoker  . Smokeless tobacco: Never Used  . Alcohol use No  . Drug use: No   Social History Narrative   05/2009 Masters in counseling in disabled.   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       Mother lived in home.  (Deceased quickly Spring 09 from metastatic melanoma)   Current Outpatient Medications on File Prior to Visit  Medication Sig Dispense Refill  . ACCU-CHEK FASTCLIX LANCETS MISC Check blood 1 - 2 times daily and as directed. Dx E11.65 200 each 1  . ALPRAZolam (XANAX) 0.5 MG tablet Take 0.5 mg by mouth at bedtime as needed (for panic attacks.). May take up to 4 tablets per day as needed    . amoxicillin-clavulanate (AUGMENTIN) 875-125 MG tablet Take 1 tablet by mouth 2 (two) times daily. 20 tablet 0  . aspirin 81 MG tablet Take 81 mg by mouth daily.    Marland Kitchen atorvastatin (LIPITOR) 40 MG tablet Take 1 tablet (40 mg total) by mouth daily. 90 tablet 3  . benzonatate (TESSALON) 200 MG capsule Take 1 capsule (200 mg total) by mouth 3 (three) times daily as needed. (Patient not taking: Reported on 03/04/2018) 30 capsule 1  . chlorhexidine (PERIDEX) 0.12 % solution   3  . desvenlafaxine (PRISTIQ) 100 MG 24 hr tablet Take 100 mg by mouth daily.    .  Dulaglutide (TRULICITY) 1.5 NF/6.2ZH SOPN Inject 1.5 mg into skin weekly 12 pen 3  . empagliflozin (JARDIANCE) 25 MG TABS tablet Take 25 mg by mouth daily. 30 tablet 5  . glucose blood (ACCU-CHEK GUIDE) test strip Check blood 1 - 2 times daily and as directed. Dx E11.65 200 each 3  . Insulin Detemir (LEVEMIR FLEXTOUCH) 100 UNIT/ML Pen Inject 60 Units into the skin daily. 54 mL 0  . Insulin Pen Needle (UNIFINE PENTIPS) 31G X 5 MM MISC USE DAILY WITH INSULIN PEN 100 each  4  . Insulin Pen Needle (UNIFINE PENTIPS) 31G X 5 MM MISC USE DAILY WITH INSULIN PEN 100 each 4  . lamoTRIgine (LAMICTAL) 150 MG tablet Take 150 mg by mouth daily.    Marland Kitchen lamoTRIgine (LAMICTAL) 25 MG tablet Take 25 mg by mouth as needed.    Elmore Guise Device MISC Use to test blood sugar once or twice daily.  Diagnosis E11.65  Insulin dependent.  True Metrix 1 each 0  . lisinopril (PRINIVIL,ZESTRIL) 10 MG tablet TAKE 1 TABLET BY MOUTH ONCE DAILY (NEED ANNUAL PHYSICAL FOR FUTHER REFILLS) 90 tablet 0  . metFORMIN (GLUCOPHAGE-XR) 500 MG 24 hr tablet Take 2 tablets (1,000 mg total) by mouth 2 (two) times daily with a meal. 360 tablet 3  . metoprolol tartrate (LOPRESSOR) 25 MG tablet TAKE 0.5 TABLETS BY MOUTH DAILY. 45 tablet 1  . Multiple Vitamin (MULTIVITAMIN) tablet Take 1 tablet by mouth daily.       No current facility-administered medications on file prior to visit.    Allergies  Allergen Reactions  . Beta Adrenergic Blockers Other (See Comments)    Drowsy, fatigue   Family History  Problem Relation Age of Onset  . Cancer Mother        Metastatic liver CA  . Thyroid disease Mother        Hypothyroidism  . Vision loss Mother        Eye tumor, removed orbit at the end of 2004  . Melanoma Mother   . Diabetes Mother   . Diabetes Father   . Heart disease Father        CABG, coronary disease  . Thyroid disease Sister        Hypothyroidism  . Colon cancer Maternal Aunt 56  . Breast cancer Neg Hx   . Colon polyps Neg Hx   .  Esophageal cancer Neg Hx   . Rectal cancer Neg Hx   . Stomach cancer Neg Hx    PE: BP (!) 148/90   Pulse (!) 110   Ht 5\' 7"  (1.702 m) Comment: measured without shoes  Wt 269 lb (122 kg)   LMP 08/28/2015 (Approximate)   BMI 42.13 kg/m  Wt Readings from Last 3 Encounters:  08/10/18 269 lb (122 kg)  06/29/18 277 lb (125.6 kg)  03/04/18 277 lb (125.6 kg)   Constitutional: overweight, in NAD Eyes: PERRLA, EOMI, no exophthalmos ENT: moist mucous membranes, no thyromegaly, no cervical lymphadenopathy Cardiovascular: Tachycardia (chronic) RR, No MRG Respiratory: CTA B Gastrointestinal: abdomen soft, NT, ND, BS+ Musculoskeletal: no deformities, strength intact in all 4 Skin: moist, warm, no rashes Neurological: no tremor with outstretched hands, DTR normal in all 4  ASSESSMENT: 1. 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 Obesity class 3  3. HL  PLAN:  1. Patient with longstanding, uncontrolled, type 2 diabetes, on oral antidiabetic regimen, basal insulin, and GLP-1 receptor agonist.  In 2018, she did better on a more plant-based diet and we were able to decrease her insulin doses, however, since then, she relaxed her diet and her sugars increased.  We had to replace Januvia with Trulicity at that time and at last visit I also advised her to add Jardiance.  I again encouraged her to go back to the plant-based diet.  She switched to working days and was also working from home and was feeling better at last visit. - at this visit, sugars are much higher, as she was off her medicines while trying to get  them covered.  She is now on all of the medicines, but she skips many doses.  We discussed about strategies to be able to remember to take them.  We also discussed about the importance of improving her diet, and she is thinking about going back to the plant-based diet.  However for now, she is very stressed, as she has to change her job and also she has some  family stressors.  For now, I advised her to focus on taking the medications consistently and then, after she gets a new job and situation stabilizes there, to start improving her diet. -We will not change her regimen for now, but at next visit, if sugars are still high, we may need to add mealtime insulin - I suggested to:  Patient Instructions  Please continue: - Metformin 2000 mg with dinner - Jardiance 25 mg before breakfast - Trulicity 1.5 mg weekly in a.m. - Levemir 70 units in the evening  Please return in 3 months with your sugar log.   - today, HbA1c is 10.1% (higher) - continue checking sugars at different times of the day - check 1-2x a day, rotating checks - advised for yearly eye exams >> she is not UTD - Return to clinic in 3 mo with sugar log      2. Obesity class 3 -We will continue Trulicity and Jardiance, which should also help with weight loss -We also discussed about going back to the plant-based diet, which she is thinking about doing.  3. HL - Reviewed latest lipid panel from 02/2018: LDL much improved from 2013, but still above target, triglycerides slightly high Lab Results  Component Value Date   CHOL 181 03/04/2018   HDL 40.90 03/04/2018   LDLCALC 108 (H) 03/04/2018   LDLDIRECT 140.1 10/06/2011   TRIG 163.0 (H) 03/04/2018   CHOLHDL 4 03/04/2018  - Continues Lipitor 40 without side effects. -Of note, she had elevated LFTs at last visit and I advised her to discuss with PCP about this.  She did not do so.  I will send these labs again to the PCP: Lab Results  Component Value Date   ALT 50 (H) 03/04/2018   AST 42 (H) 03/04/2018   ALKPHOS 54 07/07/2016   BILITOT 0.4 03/04/2018    Philemon Kingdom, MD PhD [facility] Endocrin[facility]

## 2018-11-22 ENCOUNTER — Encounter: Payer: Self-pay | Admitting: Internal Medicine

## 2018-11-22 ENCOUNTER — Ambulatory Visit (INDEPENDENT_AMBULATORY_CARE_PROVIDER_SITE_OTHER): Payer: BC Managed Care – PPO | Admitting: Internal Medicine

## 2018-11-22 DIAGNOSIS — E113299 Type 2 diabetes mellitus with mild nonproliferative diabetic retinopathy without macular edema, unspecified eye: Secondary | ICD-10-CM | POA: Diagnosis not present

## 2018-11-22 DIAGNOSIS — Z794 Long term (current) use of insulin: Secondary | ICD-10-CM | POA: Diagnosis not present

## 2018-11-22 DIAGNOSIS — E785 Hyperlipidemia, unspecified: Secondary | ICD-10-CM | POA: Diagnosis not present

## 2018-11-22 MED ORDER — INSULIN DETEMIR 100 UNIT/ML FLEXPEN: 40.0000 [IU] | PEN_INJECTOR | Freq: Two times a day (BID) | SUBCUTANEOUS | 3 refills | Status: DC

## 2018-11-22 MED ORDER — EMPAGLIFLOZIN 25 MG PO TABS: 25.0000 mg | ORAL_TABLET | Freq: Every day | ORAL | 3 refills | Status: DC

## 2018-11-22 NOTE — Patient Instructions (Addendum)
Please continue: - Metformin 1000 mg twice a day with meals - Trulicity 1.5 mg weekly in a.m.  Please increase: - Levemir to 40 units 2x a day  Please add back: - Jardiance 25 mg before breakfast  Please return in 3 months with your sugar log.

## 2018-11-22 NOTE — Progress Notes (Signed)
Patient ID: Sarah Walls, female   DOB: 1961/06/08, 58 y.o.   MRN: [id number]   Patient location: Home My location: Office  Referring Provider: Patriciaann Clan, DO  I connected with the patient on 11/22/18 at  11:46 am EDT by telephone and verified that I am speaking with the correct person.   I discussed the limitations of evaluation and management by telephone and the availability of in person appointments. The patient expressed understanding and agreed to proceed.   Details of the encounter are shown below.  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 Last visit 3.5 months ago.  At last visit, she was changing her job and was very stressed.  She also had problems affording her medicines and was on and off the medicines since the previous visit.  Even when she started to take all of them, she was missing doses.  She also relaxed her diet and sugars were higher.  HbA1c was also much higher than before.  Last hemoglobin A1c was: Lab Results  Component Value Date   HGBA1C 10.1 (A) 08/10/2018   HGBA1C 8.1 (A) 03/04/2018   HGBA1C 8.3 10/29/2017   HGBA1C 9.4 07/23/2017   HGBA1C 7.6 02/12/2017   HGBA1C 8.8 11/27/2016   HGBA1C 9.2 (H) 09/15/2016   HGBA1C 8.5 (H) 04/27/2016   HGBA1C 8.6 (H) 01/27/2016   HGBA1C 9.2 (H) 10/04/2015   She is on: - Metformin 2000 mg with dinner >> 1000 mg 2x a day - Jardiance 25 mg before breakfast - started 2019 - came off 3 weeks as she could not refill this - Trulicity 1.5 mg weekly in a.m. Tyler Aas >> Levemir 70 units in the evening We stopped Januvia when we started Trulicity.  Pt checks her sugars 1-2x a day:  - am: 127-181, 193 >> 155-190 >> before stopping Jardiance: 157-190; after: 180-210 - 2h after b'fast: 216, 245 >> 189 >> 199-249 >> after stopping Jardiance:180-228, 257 - before lunch: 213 (snacking) > 204-211 >> n/c -  2h after lunch: 264 >> n/c >> 226-248, 328 >> n/c - before dinner: 134-183 >> n/c >> 189 >> after stopping Jardiance:  195-215 - 2h after dinner: 172-253 >> 220 >> n/c - bedtime: 147-208, 307 >> 170-223 >> n/c >> 190-220, 270 - nighttime: n/c Lowest sugar was 127 >> 155 >> 160; she has hypoglycemia awareness in the 70s. Highest sugar was 384 (off Trulicity) >> 665 >> 993.  Glucometer: AccuChek  Pt's meals are: - Breakfast: b'fast sandwich + yoghurt; boiled eggs + slice of bacon + yoghurt - Lunch: frozen meal +/- salad - Dinner: frozen meals - Snacks: zucchini bread, apple  -No CKD, last BUN/creatinine:  Lab Results  Component Value Date   BUN 8 03/04/2018   BUN 10 07/07/2016   CREATININE 0.58 03/04/2018   CREATININE 0.64 07/07/2016  On lisinopril. -+ HL; last set of lipids: Lab Results  Component Value Date   CHOL 181 03/04/2018   HDL 40.90 03/04/2018   LDLCALC 108 (H) 03/04/2018   LDLDIRECT 140.1 10/06/2011   TRIG 163.0 (H) 03/04/2018   CHOLHDL 4 03/04/2018  On Lipitor 40. - last eye exam was in 2018: + Mild DR.  She has a history of cataract surgery in 2016. - no numbness and tingling in her feet.  ROS: Constitutional: no weight gain/no weight loss, no fatigue, no subjective hyperthermia, no subjective hypothermia Eyes: no blurry vision,  no xerophthalmia ENT: no sore throat, no nodules palpated in neck, no dysphagia, no odynophagia, no hoarseness Cardiovascular: no CP/no SOB/no palpitations/no leg swelling Respiratory: no cough/no SOB/no wheezing Gastrointestinal: no N/no V/no D/no C/no acid reflux Musculoskeletal: no muscle aches/+ joint aches Skin: no rashes, no hair loss Neurological: no tremors/no numbness/no tingling/no dizziness  I reviewed pt's medications, allergies, PMH, social hx, family hx, and changes were documented in the history of present illness. Otherwise, unchanged from my initial visit note.  Past Medical History:  Diagnosis Date  . Allergy    . Arthritis   . Bipolar disorder ([place])   . Cataract   . Depression    BAD2 - Dr. Albertine Patricia  . Diabetes mellitus without complication ([place])   . Hyperlipidemia   . Hypertension   . Panic attacks    BAD2 - Dr. Albertine Patricia  . Personal history of colonic polyps - adenomas 10/13/2013   12/2013 - 6 adenomas max 12 mm - repeat colonoscopy  2018   . Tuberculosis    positive PPD   Past Surgical History:  Procedure Laterality Date  . CATARACT EXTRACTION Bilateral 2015  . CESAREAN SECTION  1990   Fetal Distress  . COLONOSCOPY    . EYE SURGERY    . POLYPECTOMY    . TONSILLECTOMY  1969   Social History   Social History  . Marital status: Married    Spouse name: N/A  . Number of children: 1   Occupational History  . 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    Social History Main Topics  . Smoking status: Never Smoker  . Smokeless tobacco: Never Used  . Alcohol use No  . Drug use: No   Social History Narrative   05/2009 Masters in counseling in disabled.   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       Mother lived in home.  (Deceased quickly Spring 09 from metastatic melanoma)   Current Outpatient Medications on File Prior to Visit  Medication Sig Dispense Refill  . ACCU-CHEK FASTCLIX LANCETS MISC Check blood 1 - 2 times daily and as directed. Dx E11.65 200 each 1  . ALPRAZolam (XANAX) 0.5 MG tablet Take 0.5 mg by mouth at bedtime as needed (for panic attacks.). May take up to 4 tablets per day as needed    . aspirin 81 MG tablet Take 81 mg by mouth daily.    Marland Kitchen atorvastatin (LIPITOR) 40 MG tablet Take 1 tablet (40 mg total) by mouth daily. 90 tablet 3  . chlorhexidine (PERIDEX) 0.12 % solution   3  . desvenlafaxine (PRISTIQ) 100 MG 24 hr tablet Take 100 mg by mouth daily.    . Dulaglutide (TRULICITY) 1.5 HQ/4.6NG SOPN Inject 1.5 mg into skin weekly 12 pen 3  . empagliflozin (JARDIANCE) 25 MG TABS tablet Take 25 mg by  mouth daily. 30 tablet 5  . glucose blood (ACCU-CHEK GUIDE) test strip Check blood 1 - 2 times daily and as directed. Dx E11.65 200 each 3  . Insulin Detemir (LEVEMIR FLEXTOUCH) 100 UNIT/ML Pen Inject 70 Units into the skin daily. 45 mL 3  . Insulin Pen Needle (UNIFINE PENTIPS) 31G X 5 MM MISC USE DAILY WITH INSULIN PEN 100 each 4  . lamoTRIgine (LAMICTAL) 150 MG tablet Take 150 mg by mouth daily.    Marland Kitchen lamoTRIgine (LAMICTAL) 25 MG tablet Take 25 mg by mouth as needed.    Marland Kitchen lisinopril (PRINIVIL,ZESTRIL) 10 MG tablet TAKE 1  TABLET BY MOUTH ONCE DAILY (NEED ANNUAL PHYSICAL FOR FUTHER REFILLS) 90 tablet 0  . metFORMIN (GLUCOPHAGE-XR) 500 MG 24 hr tablet Take 2 tablets (1,000 mg total) by mouth 2 (two) times daily with a meal. 360 tablet 3  . metoprolol tartrate (LOPRESSOR) 25 MG tablet TAKE 0.5 TABLETS BY MOUTH DAILY. 45 tablet 1  . Multiple Vitamin (MULTIVITAMIN) tablet Take 1 tablet by mouth daily.       No current facility-administered medications on file prior to visit.    Allergies  Allergen Reactions  . Beta Adrenergic Blockers Other (See Comments)    Drowsy, fatigue   Family History  Problem Relation Age of Onset  . Cancer Mother        Metastatic liver CA  . Thyroid disease Mother        Hypothyroidism  . Vision loss Mother        Eye tumor, removed orbit at the end of 2004  . Melanoma Mother   . Diabetes Mother   . Diabetes Father   . Heart disease Father        CABG, coronary disease  . Thyroid disease Sister        Hypothyroidism  . Colon cancer Maternal Aunt 4  . Breast cancer Neg Hx   . Colon polyps Neg Hx   . Esophageal cancer Neg Hx   . Rectal cancer Neg Hx   . Stomach cancer Neg Hx    PE: LMP 08/28/2015 (Approximate)  Wt Readings from Last 3 Encounters:  08/10/18 269 lb (122 kg)  06/29/18 277 lb (125.6 kg)  03/04/18 277 lb (125.6 kg)   Constitutional:  in NAD  The physical exam was not performed (telephone visit).  ASSESSMENT: 1. DM2,  insulin-dependent, uncontrolled, with complications - mild DR  2. Obesity class 3  3. HL  PLAN:  1. Patient with longstanding, uncontrolled, type 2 diabetes, on oral antidiabetic regimen, basal insulin, and weekly GLP-1 receptor agonist.  In 2018, she did better a more plant-based diet we were able to decrease her insulin doses, however, since then, she relaxed her diet and her sugars increased.  At last visit, she wanted to return to the plant-based diet, especially since sugars were much higher.  At that time, she had problems getting her medicines covered and she was off the medicines for period of time, but restarted them before last visit.  She also had a lot of stressors and had to change her job so we did not change her regimen but decided to give her more time before we added mealtime insulin. -At this visit, sugars are still very high, and especially higher after she ran out of Jardiance approximately 3 weeks before.  I strongly advised her to restart this.  In an effort to avoid starting mealtime insulin I will advise her to split Levemir into 2 doses and increase the dose to 40 units twice a day.  I suggested to follow her diet, for example weight watchers.  Otherwise, we will continue metformin - I suggested to:  Patient Instructions  Please continue: - Metformin 1000 mg twice a day with meals - Trulicity 1.5 mg weekly in a.m.  Please increase: - Levemir to 40 units 2x a day  Please add back: - Jardiance 25 mg before breakfast  Please return in 3 months with your sugar log.   - we we will check her HbA1c again when she returns to clinic - continue checking sugars at different times of the day -  check 1-2x a day, rotating checks - advised for yearly eye exams >> she is not UTD - Return to clinic in 3 mo with sugar log       2. Obesity class 3 - stable weight -Continue Trulicity and Jardiance which should also help with weight loss -At last visit, we discussed about going  back to the plant-based diet, which she was contemplating, but did not start this.  I suggested to try weight watchers or whole 30 diet.  3. HL - Reviewed latest lipid panel from 02/2018: LDL above goal, triglycerides slightly high, HDL at goal Lab Results  Component Value Date   CHOL 181 03/04/2018   HDL 40.90 03/04/2018   LDLCALC 108 (H) 03/04/2018   LDLDIRECT 140.1 10/06/2011   TRIG 163.0 (H) 03/04/2018   CHOLHDL 4 03/04/2018  - Continues Lipitor 40 without side effects.   - time spent with the patient: 18 min , of which >50% was spent in obtaining information about her symptoms, reviewing her previous labs, evaluations, and treatments, counseling her about her conditions (please see the discussed topics above), and developing a plan to further investigate and treat them; she had a number of questions which I addressed.  Philemon Kingdom, MD PhD [facility] End[facility]

## 2018-12-06 DIAGNOSIS — F3181 Bipolar II disorder: Secondary | ICD-10-CM | POA: Diagnosis not present

## 2018-12-09 ENCOUNTER — Other Ambulatory Visit: Payer: Self-pay

## 2018-12-09 ENCOUNTER — Encounter: Payer: Self-pay | Admitting: Family Medicine

## 2018-12-09 ENCOUNTER — Ambulatory Visit (INDEPENDENT_AMBULATORY_CARE_PROVIDER_SITE_OTHER): Payer: BC Managed Care – PPO | Admitting: Family Medicine

## 2018-12-09 DIAGNOSIS — R42 Dizziness and giddiness: Secondary | ICD-10-CM

## 2018-12-09 MED ORDER — ONDANSETRON HCL 4 MG PO TABS: 4.0000 mg | ORAL_TABLET | Freq: Three times a day (TID) | ORAL | 0 refills | Status: DC | PRN

## 2018-12-09 NOTE — Patient Instructions (Addendum)
We discussed salt restriction and avoiding caffeine, alcohol and other possible triggers to help your symptoms.  We decided to hold off on vestibular rehabilitation today but if you change your mind please let us know and we will place the referral for you. I have included some information below that you may find helpful.  Call the clinic if you have any questions about what we discussed today.  Meniere Disease  Meniere disease is an inner ear disorder. It causes attacks of a spinning sensation (vertigo), dizziness, and ringing in the ear (tinnitus). It also causes hearing loss and a feeling of fullness or pressure in the ear. This is a lifelong condition, and it may get worse over time. You may have drop attacks or severe dizziness that makes you fall. A drop attack is when you suddenly fall without losing consciousness and you quickly recover after a few seconds or minutes. What are the causes? This condition is caused by having too much of the fluid that is in your inner ear (endolymph). When fluid builds up in your inner ear, it affects the nerves that control balance and hearing. The reason for the fluid buildup is not known. Possible causes include:  Allergies.  An abnormal reaction of the body's defense system (autoimmune disease).  Viral infection of the inner ear.  Head injury. What increases the risk? You are more likely to develop this condition if:  You are older than age 4.  You have a family history of Meniere disease.  You have a history of autoimmune disease.  You have a history of migraine headaches. What are the signs or symptoms? Symptoms of this condition can come and go and may last for up to 4 hours at a time. Symptoms usually start in one ear. They may become more frequent and eventually involve both ears. Symptoms can include:  Fullness and pressure in your ear.  Roaring or ringing in your ear.  Vertigo and loss of balance.  Dizziness.  Decreased hearing.   Nausea and vomiting. How is this diagnosed? This condition is diagnosed based on:  A physical exam.  Tests , such as: ? A hearing test (audiogram). ? An electronystagmogram. This tests your balance nerve (vestibular nerve). ? Imaging studies of your inner ear, such as CT scan or MRI. ? Other balance tests, such as rotational or balance platform tests. How is this treated? There is no cure for this condition, but treatment can help to manage your symptoms. Treatment may include:  A low-salt diet. Limiting salt may help to reduce fluid in the body and relieve symptoms.  Oral or injected medicines to reduce or control: ? Vertigo. ? Nausea. ? Fluid retention. ? Dizziness.  Use of an air pressure pulse generator. This is a machine that sends small pressure pulses into your ear canal.  Hearing aids.  Inner ear surgery. This is rare. When you have symptoms, it can be helpful to lie down on a flat surface and focus your eyes on one object that does not move. Try to stay in that position until your symptoms go away. Follow these instructions at home: Eating and drinking  Eat the same amount of food at the same time every day, including snacks.  Do not skip meals.  Avoid caffeine.  Drink enough fluids to keep your urine clear or pale yellow.  Limit alcoholic drinks to one drink a day for non-pregnant women and 2 drinks a day for men. One drink equals 12 oz of beer,  5 oz of wine, or 1 oz of hard liquor.  Limit the salt (sodium) in your diet as told by your health care provider. Check ingredients and nutrition facts on packaged foods and beverages.  Do not eat foods that contain monosodium glutamate (MSG). General instructions  Do not use any products that contain nicotine or tobacco, such as cigarettes and e-cigarettes. If you need help quitting, ask your health care provider.  Take over-the-counter and prescription medicines only as told by your health care provider.  Find  ways to reduce or avoid stress. If you need help with this, ask your health care provider.  Do not drive if you have vertigo or dizziness. Contact a health care provider if:  You have symptoms that last longer than 4 hours.  You have new or worse symptoms. Get help right away if:  You have been vomiting for 24 hours.  You cannot keep fluids down.  You have chest pain or trouble breathing. Summary  Meniere disease is an inner ear disorder. It causes attacks of a spinning sensation (vertigo), dizziness, and ringing in the ear (tinnitus). It also causes hearing loss and a feeling of fullness or pressure in the ear.  Symptoms of this condition can come and go and may last for up to 4 hours at a time.  When you have symptoms, it can be helpful to lie down on a flat surface and focus your eyes on one object that does not move. Try to stay in that position until your symptoms go away. This information is not intended to replace advice given to you by your health care provider. Make sure you discuss any questions you have with your health care provider. Document Released: 05/29/2000 Document Revised: 05/14/2017 Document Reviewed: 04/22/2016 Elsevier Patient Education  2020 [employer].

## 2018-12-09 NOTE — Progress Notes (Signed)
   Subjective:   Patient ID: Sarah Walls    DOB: 22-May-1961, 58 y.o. female   MRN: [id number]  CC: vomiting  HPI: Sarah Walls is a 58 y.o. female who presents to clinic today for the following issue.  Vomiting  Going on for a few months, at first she assumed it is stress related.   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 This has actually gotten better since.  She has noticed more frequent dizziness over last couple weeks and has had 3 separate episodes of vomiting after feeling the room spinning.  Denies balance issues, no h/o fall or head injury.  She endorses bilateral tinnitus at times.  Denies headaches but endorses some sensitivity to noise.  No decreased hearing.  No fever, chills.   ROS: See HPI for pertinent ROS.  [place]: Pertinent past medical, surgical, family, and social history were reviewed and updated as appropriate. Smoking status reviewed.  Medications reviewed. Objective:   BP (!) 150/88   Pulse (!) 126   LMP 08/28/2015 (Approximate)   SpO2 98%  Vitals and nursing note reviewed.  General: well nourished, pleasant 58 yo female  HEENT: NCAT, EOMI, PERRL, MMM, oropharynx clear Neck: supple CV: RRR no MRG  Lungs: normal effort  Abdomen: soft, +bs  Neuro: alert, oriented x3, no focal deficits, normal gait   Assessment & Plan:   Vertigo Vertigo likely 2/2 Meniere disease.  3 episodes of vomiting over the last several weeks, do not suspect infectious cause given timeline and no systemic symptoms.   Discussed initial diet and lifestyle modifications.  No known food or environmental triggers however discussed possible triggers may include high salt intake, caffeine, alcohol, nicotine, and stress.  She does not smoke but endorses eating a lot of processed foods.  Advised salt restriction and limiting caffeine and alcohol.   Rx: zofran prn for nausea  Offered vestibular rehab for exercises, she would like to  hold off at this time and see if dietary changes lead to some improvement.  May continue meclizine  Follow up if symptoms worsen or do not improve.   Meds ordered this encounter  Medications  . ondansetron (ZOFRAN) 4 MG tablet    Sig: Take 1 tablet (4 mg total) by mouth every 8 (eight) hours as needed for nausea or vomiting.    Dispense:  20 tablet    Refill:  0   Lovenia Kim, MD [facility] PGY-3 12/11/2018 8:52 PM

## 2018-12-11 NOTE — Assessment & Plan Note (Signed)
Vertigo likely 2/2 Meniere disease.  3 episodes of vomiting over the last several weeks, do not suspect infectious cause given timeline and no systemic symptoms.   Discussed initial diet and lifestyle modifications.  No known food or environmental triggers however discussed possible triggers may include high salt intake, caffeine, alcohol, nicotine, and stress.  She does not smoke but endorses eating a lot of processed foods.  Advised salt restriction and limiting caffeine and alcohol.   Rx: zofran prn for nausea  Offered vestibular rehab for exercises, she would like to hold off at this time and see if dietary changes lead to some improvement.  May continue meclizine  Follow up if symptoms worsen or do not improve.

## 2018-12-13 ENCOUNTER — Ambulatory Visit: Payer: BC Managed Care – PPO | Admitting: Family Medicine

## 2018-12-26 ENCOUNTER — Other Ambulatory Visit: Payer: Self-pay | Admitting: Internal Medicine

## 2019-01-13 ENCOUNTER — Encounter: Payer: Self-pay | Admitting: Internal Medicine

## 2019-01-19 ENCOUNTER — Other Ambulatory Visit: Payer: Self-pay

## 2019-01-19 ENCOUNTER — Encounter: Payer: Self-pay | Admitting: Internal Medicine

## 2019-01-19 MED ORDER — LEVEMIR FLEXTOUCH 100 UNIT/ML ~~LOC~~ SOPN: PEN_INJECTOR | SUBCUTANEOUS | 0 refills | Status: DC

## 2019-01-19 MED ORDER — TRULICITY 1.5 MG/0.5ML ~~LOC~~ SOAJ: SUBCUTANEOUS | 0 refills | Status: DC

## 2019-01-27 ENCOUNTER — Other Ambulatory Visit: Payer: Self-pay

## 2019-01-27 MED ORDER — LEVEMIR FLEXTOUCH 100 UNIT/ML ~~LOC~~ SOPN: PEN_INJECTOR | SUBCUTANEOUS | 1 refills | Status: DC

## 2019-02-05 ENCOUNTER — Encounter: Payer: Self-pay | Admitting: Family Medicine

## 2019-02-06 ENCOUNTER — Telehealth: Payer: Self-pay | Admitting: Family Medicine

## 2019-02-06 NOTE — Telephone Encounter (Signed)
Reviewed form and placed in PCP's box for completion.  .Brecken Walth R Demontae Antunes, CMA  

## 2019-02-06 NOTE — Telephone Encounter (Signed)
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  Verified that patient section of form has been completed.  Last DOS/WCC with PCP was 12/09/2018.  Placed form in team folder to be completed by clinical staff.  Sarah Walls  Pt accidentally filled out first 2 pages of the form, there's another blank FMLA form attached at the end.

## 2019-02-09 NOTE — Telephone Encounter (Signed)
Completed FMLA paperwork, placed in the fax out box to be faxed per patient request.  Patient to be notified after paperwork has been faxed.  Patriciaann Clan, DO

## 2019-03-02 ENCOUNTER — Ambulatory Visit: Payer: BC Managed Care – PPO | Admitting: Internal Medicine

## 2019-03-03 DIAGNOSIS — E119 Type 2 diabetes mellitus without complications: Secondary | ICD-10-CM | POA: Diagnosis not present

## 2019-03-03 LAB — HM DIABETES EYE EXAM

## 2019-03-17 ENCOUNTER — Other Ambulatory Visit: Payer: Self-pay

## 2019-03-21 ENCOUNTER — Ambulatory Visit (INDEPENDENT_AMBULATORY_CARE_PROVIDER_SITE_OTHER): Payer: BC Managed Care – PPO | Admitting: Internal Medicine

## 2019-03-21 ENCOUNTER — Encounter: Payer: Self-pay | Admitting: Internal Medicine

## 2019-03-21 ENCOUNTER — Other Ambulatory Visit: Payer: Self-pay

## 2019-03-21 VITALS — BP 138/64 | HR 121 | Temp 97.0°F | Ht 67.0 in | Wt 263.8 lb

## 2019-03-21 DIAGNOSIS — E785 Hyperlipidemia, unspecified: Secondary | ICD-10-CM | POA: Diagnosis not present

## 2019-03-21 DIAGNOSIS — E113291 Type 2 diabetes mellitus with mild nonproliferative diabetic retinopathy without macular edema, right eye: Secondary | ICD-10-CM | POA: Diagnosis not present

## 2019-03-21 DIAGNOSIS — Z23 Encounter for immunization: Secondary | ICD-10-CM | POA: Diagnosis not present

## 2019-03-21 DIAGNOSIS — Z794 Long term (current) use of insulin: Secondary | ICD-10-CM | POA: Diagnosis not present

## 2019-03-21 LAB — POCT GLYCOSYLATED HEMOGLOBIN (HGB A1C): Hemoglobin A1C: 8.8 % — AB (ref 4.0–5.6)

## 2019-03-21 MED ORDER — METFORMIN HCL ER 500 MG PO TB24: 1000.0000 mg | ORAL_TABLET | Freq: Two times a day (BID) | ORAL | 3 refills | Status: DC

## 2019-03-21 NOTE — Progress Notes (Signed)
Patient ID: Sarah Walls, female   DOB: [birth year], 58 y.o.   MRN: [id number]   HPI: Sarah Walls is a 58 y.o.-year-old female, presenting for f/u for DM2, dx in ~2010, insulin-dependent since ~2015-16, uncontrolled, with complications (mild DR). Last visit 4 months ago.  At last visit, she was stressed from changing her job, she was also missing medication doses, was stopped Jardiance due to price, and she was eating more >> sugars were higher.  They are little better now.   Last hemoglobin A1c was: Lab Results  Component Value Date   HGBA1C 10.1 (A) 08/10/2018   HGBA1C 8.1 (A) 03/04/2018   HGBA1C 8.3 10/29/2017   HGBA1C 9.4 07/23/2017   HGBA1C 7.6 02/12/2017   HGBA1C 8.8 11/27/2016   HGBA1C 9.2 (H) 09/15/2016   HGBA1C 8.5 (H) 04/27/2016   HGBA1C 8.6 (H) 01/27/2016   HGBA1C 9.2 (H) 10/04/2015   She is on: - Metformin 2000 mg with dinner >> ER 1000 mg 2x a day  - nausea now, also she received a new lot of metformin - Jardiance 25 mg before breakfast - Trulicity 1.5 mg weekly in a.m. - Tyler Aas >> Levemir 70 units in at bedtime >>  40 units twice a day >> she reduce the dose to 60 units in am and she was forgetting the second dose today Unfortunately, 3 weeks before last visit, she came off Jardiance as she could not refill it We stopped Januvia when we started Trulicity.  Pt checks her sugars 1-2 times a day: - am: 155-190 >> before stopping Jardiance: 157-190; after: 180-210 >> 156-197 - 2h after b'fast: 189 >> 199-249 >> after stopping Jardiance:180-228, 257 >> 202-238 - before lunch: 213 (snacking) > 204-211 >> n/c >> 174 - 2h after lunch: 264 >> n/c >> 226-248, 328 >> n/c >> 212 - before dinner: 134-183 >> n/c >> 189 >> after stopping Jardiance:  195-215 >> 179 - 2h after dinner: 172-253 >> 220 >> n/c >> 203 - bedtime: 147-208, 307 >> 170-223 >> n/c >> 190-220, 270 >> n/c - nighttime: n/c Lowest sugar was 127 >> 155 >> 160 >> 156; she has hypoglycemia awareness in the  70s. Highest sugar was 123456 (off Trulicity) >> [id number] >> [id number] >> 238  Glucometer: AccuChek  Pt's meals are: - Breakfast: b'fast sandwich + yoghurt; boiled eggs + slice of bacon + yoghurt - Lunch: frozen meal +/- salad - Dinner: frozen meals - Snacks: zucchini bread, apple  -No CKD, last BUN/creatinine:  Lab Results  Component Value Date   BUN 8 03/04/2018   BUN 10 07/07/2016   CREATININE 0.58 03/04/2018   CREATININE 0.64 07/07/2016  On lisinopril. -+ HL; last set of lipids: Lab Results  Component Value Date   CHOL 181 03/04/2018   HDL 40.90 03/04/2018   LDLCALC 108 (H) 03/04/2018   LDLDIRECT 140.1 10/06/2011   TRIG 163.0 (H) 03/04/2018   CHOLHDL 4 03/04/2018  On Lipitor 40. - last eye exam was in 02/2019: ? DR, prev. + Mild  DR; she has a history of cataract surgery in 2016. - no numbness and tingling in her feet.  ROS: Constitutional: no weight gain/+ weight loss, no fatigue, no subjective hyperthermia, no subjective hypothermia Eyes: no blurry vision, no xerophthalmia ENT: no sore throat, no nodules palpated in neck, no dysphagia, no odynophagia, no hoarseness Cardiovascular: no CP/no SOB/no palpitations/no leg swelling Respiratory: no cough/no SOB/no wheezing Gastrointestinal: +  N/+ V/no D/no C/no acid reflux Musculoskeletal: no muscle aches/no joint aches Skin:  no rashes, no hair loss Neurological: no tremors/no numbness/no tingling/no dizziness, + vertigo (she has Mnire's disease)  I reviewed pt's medications, allergies, PMH, social hx, family hx, and changes were documented in the history of present illness. Otherwise, unchanged from my initial visit note.  Past Medical History:  Diagnosis Date  . Allergy   . Arthritis   . Bipolar disorder ([place])   . Cataract   . Depression    BAD2 - Dr. Albertine Patricia  . Diabetes mellitus without complication ([place])   . Hyperlipidemia   . Hypertension   . Panic attacks    BAD2 - Dr. Albertine Patricia  . Personal history of colonic polyps -  adenomas 10/13/2013   12/2013 - 6 adenomas max 12 mm - repeat colonoscopy  2018   . Tuberculosis    positive PPD   Past Surgical History:  Procedure Laterality Date  . CATARACT EXTRACTION Bilateral 2015  . CESAREAN SECTION  1990   Fetal Distress  . COLONOSCOPY    . EYE SURGERY    . POLYPECTOMY    . TONSILLECTOMY  1969   Social History   Social History  . Marital status: Married    Spouse name: N/A  . Number of children: 1   Occupational History  . 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.    Social History Main Topics  . Smoking status: Never Smoker  . Smokeless tobacco: Never Used  . Alcohol use No  . Drug use: No   Social History Narrative   05/2009 Masters in counseling in disabled.   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       Mother lived in home.  (Deceased quickly Spring 09 from metastatic melanoma)   Current Outpatient Medications on File Prior to Visit  Medication Sig Dispense Refill  . ACCU-CHEK FASTCLIX LANCETS MISC Check blood 1 - 2 times daily and as directed. Dx E11.65 200 each 1  . ALPRAZolam (XANAX) 0.5 MG tablet Take 0.5 mg by mouth at bedtime as needed (for panic attacks.). May take up to 4 tablets per day as needed    . aspirin 81 MG tablet Take 81 mg by mouth daily.    Marland Kitchen atorvastatin (LIPITOR) 40 MG tablet Take 1 tablet (40 mg total) by mouth daily. 90 tablet 3  . chlorhexidine (PERIDEX) 0.12 % solution   3  . desvenlafaxine (PRISTIQ) 100 MG 24 hr tablet Take 100 mg by mouth daily.    . Dulaglutide (TRULICITY) 1.5 0000000 SOPN Inject 1.5 mg into skin weekly 12 pen 0  . empagliflozin (JARDIANCE) 25 MG TABS tablet Take 25 mg by mouth daily. 90 tablet 3  . glucose blood (ACCU-CHEK GUIDE) test strip Check blood 1 - 2 times daily and as directed. Dx E11.65 200 each 3  . Insulin Detemir (LEVEMIR FLEXTOUCH) 100 UNIT/ML Pen INJECT 70 UNITS INTO THE SKIN DAILY; PT REQUESTS 90 DAY SUPPLY 20 pen 1   . Insulin Pen Needle (UNIFINE PENTIPS) 31G X 5 MM MISC USE DAILY WITH INSULIN PEN 100 each 4  . lamoTRIgine (LAMICTAL) 150 MG tablet Take 150 mg by mouth daily.    Marland Kitchen lamoTRIgine (LAMICTAL) 25 MG tablet Take 25 mg by mouth as needed.    Marland Kitchen lisinopril (PRINIVIL,ZESTRIL) 10 MG tablet TAKE 1 TABLET BY MOUTH ONCE DAILY (NEED ANNUAL PHYSICAL FOR FUTHER REFILLS) 90 tablet 0  . metFORMIN (GLUCOPHAGE-XR) 500 MG 24 hr tablet Take 2 tablets (1,000 mg total) by mouth 2 (two)  times daily with a meal. 360 tablet 3  . metoprolol tartrate (LOPRESSOR) 25 MG tablet TAKE 0.5 TABLETS BY MOUTH DAILY. 45 tablet 1  . Multiple Vitamin (MULTIVITAMIN) tablet Take 1 tablet by mouth daily.      . ondansetron (ZOFRAN) 4 MG tablet Take 1 tablet (4 mg total) by mouth every 8 (eight) hours as needed for nausea or vomiting. 20 tablet 0   No current facility-administered medications on file prior to visit.    Allergies  Allergen Reactions  . Beta Adrenergic Blockers Other (See Comments)    Drowsy, fatigue   Family History  Problem Relation Age of Onset  . Cancer Mother        Metastatic liver CA  . Thyroid disease Mother        Hypothyroidism  . Vision loss Mother        Eye tumor, removed orbit at the end of 2004  . Melanoma Mother   . Diabetes Mother   . Diabetes Father   . Heart disease Father        CABG, coronary disease  . Thyroid disease Sister        Hypothyroidism  . Colon cancer Maternal Aunt 40  . Breast cancer Neg Hx   . Colon polyps Neg Hx   . Esophageal cancer Neg Hx   . Rectal cancer Neg Hx   . Stomach cancer Neg Hx    PE: BP 138/64   Pulse (!) 121   Temp (!) 97 F (36.1 C) (Temporal)   Ht 5\' 7"  (1.702 m)   Wt 263 lb 12.8 oz (119.7 kg)   LMP 08/28/2015 (Approximate)   SpO2 97%   BMI 41.32 kg/m  Wt Readings from Last 3 Encounters:  03/21/19 263 lb 12.8 oz (119.7 kg)  08/10/18 269 lb (122 kg)  06/29/18 277 lb (125.6 kg)   Constitutional: overweight, in NAD Eyes: PERRLA, EOMI, no  exophthalmos ENT: moist mucous membranes, no thyromegaly, no cervical lymphadenopathy Cardiovascular: tachycardia, RR, No MRG Respiratory: CTA B Gastrointestinal: abdomen soft, NT, ND, BS+ Musculoskeletal: no deformities, strength intact in all 4 Skin: moist, warm, no rashes Neurological: no tremor with outstretched hands, DTR normal in all 4  ASSESSMENT: 1. 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 Obesity class 3  3. HL  PLAN:  1. Patient with longstanding, uncontrolled, type 2 diabetes, on oral antidiabetic regiment and also weekly GLP-1 receptor agonist and long-acting insulin, increased at last visit.  Last visit, she was very stressed due to changing her job and was eating more and taking her medications more sporadically.  She was also Jardiance x 3 weeks due to price.  We restarted this at last visit and she takes it consistently now.  We also increased the dose of Levemir, as her sugars were much higher than before.  We discussed that if sugars did not improve, we would need mealtime insulin.  However, I did discuss with her about the need for improving her diet, either by doing weight watchers or cold therapy, if she cannot follow a plant-based diet, which would be ideal. -At this visit, sugars are still high, but slightly better.  She is still having problems remembering her medicines and she had to move the entire Levemir dose into 1 dose and take it earlier in the day she was forgetting the second dose.  She is also having problems with nausea/vomiting/vertigo as a part of Mnire's disease and actually has got an FMLA form for  work for when she has symptoms.  She is trying to adjust the diet to a more plant-based one, but she also has IBS and has problems tolerating some of the foods. -At this visit, we discussed about increasing Trulicity dose, as she does not feel that the nausea is related to Trulicity, but to Mnire's disease.  January's at a dose  of 3 mg is not at the pharmacy and I advised him to double up on her Trulicity 1.5 mg dose and let me know when she is close to running out so I can send the new dose to her pharmacy.  She needs 5-month supplies sent to [facility]. - I suggested to:  Patient Instructions  Please continue: - Metformin 1000 mg twice a day with meals - Jardiance 25 mg before breakfast - Levemir 60 units in am  Please increase: - Trulicity 3 mg weekly in a.m.  Please return in 3-4 months with your sugar log.   - we checked her HbA1c: 8.8% (better) - advised to check sugars at different times of the day - 1-2x a day, rotating check times - advised for yearly eye exams >> she is now UTD - she will have an annual physical exam with PCP and will get annual labs checked there - return to clinic in 3-4 months       2. Obesity class 3 -Continue Trulicity and Jardiance which should also help with weight loss.  We will increase her Trulicity dose, which should also help -She had success on a plant-based diet in the past but did not want to retry it at last visit so I suggested weight watchers or the whole 30 diet - lost 6 lbs since last OV  3. HL -Reviewed latest lipid panel from 02/2018: LDL above goal, triglycerides slightly high, HDL at goal Lab Results  Component Value Date   CHOL 181 03/04/2018   HDL 40.90 03/04/2018   LDLCALC 108 (H) 03/04/2018   LDLDIRECT 140.1 10/06/2011   TRIG 163.0 (H) 03/04/2018   CHOLHDL 4 03/04/2018  -Continues Lipitor 40 without side effects.  Philemon Kingdom, MD PhD [facility]

## 2019-03-21 NOTE — Patient Instructions (Addendum)
Please continue: - Metformin 1000 mg twice a day with meals - Jardiance 25 mg before breakfast - Levemir 60 units in am  Please increase: - Trulicity 3 mg weekly in a.m.  Please return in 3-4 months with your sugar log.

## 2019-03-27 ENCOUNTER — Telehealth: Payer: Self-pay | Admitting: Family Medicine

## 2019-03-28 NOTE — Telephone Encounter (Signed)
Please call patient to see if she is interested in coming to the clinic for follow-up of her Mnire's disease and other chronic conditions.  Thank you so much!  Patriciaann Clan, DO

## 2019-03-28 NOTE — Telephone Encounter (Signed)
LVM for patient to call clinic to schedule follow up appointment.  Sarah Walls, [place]

## 2019-04-07 ENCOUNTER — Other Ambulatory Visit: Payer: Self-pay

## 2019-04-07 ENCOUNTER — Other Ambulatory Visit: Payer: Self-pay | Admitting: Internal Medicine

## 2019-04-07 MED ORDER — ONDANSETRON HCL 4 MG PO TABS: 4.0000 mg | ORAL_TABLET | Freq: Three times a day (TID) | ORAL | 0 refills | Status: DC | PRN

## 2019-04-12 ENCOUNTER — Encounter: Payer: Self-pay | Admitting: Family Medicine

## 2019-04-23 ENCOUNTER — Encounter: Payer: Self-pay | Admitting: Internal Medicine

## 2019-04-24 MED ORDER — TRULICITY 3 MG/0.5ML ~~LOC~~ SOAJ: 3.0000 mg | SUBCUTANEOUS | 2 refills | Status: DC

## 2019-05-01 ENCOUNTER — Ambulatory Visit (INDEPENDENT_AMBULATORY_CARE_PROVIDER_SITE_OTHER): Payer: BC Managed Care – PPO | Admitting: Family Medicine

## 2019-05-01 ENCOUNTER — Encounter: Payer: Self-pay | Admitting: Family Medicine

## 2019-05-01 ENCOUNTER — Other Ambulatory Visit: Payer: Self-pay

## 2019-05-01 VITALS — BP 152/88 | HR 124 | Wt 268.2 lb

## 2019-05-01 DIAGNOSIS — H8102 Meniere's disease, left ear: Secondary | ICD-10-CM

## 2019-05-01 DIAGNOSIS — Z0000 Encounter for general adult medical examination without abnormal findings: Secondary | ICD-10-CM

## 2019-05-01 DIAGNOSIS — E785 Hyperlipidemia, unspecified: Secondary | ICD-10-CM

## 2019-05-01 DIAGNOSIS — Z1231 Encounter for screening mammogram for malignant neoplasm of breast: Secondary | ICD-10-CM

## 2019-05-01 DIAGNOSIS — R000 Tachycardia, unspecified: Secondary | ICD-10-CM

## 2019-05-01 DIAGNOSIS — I10 Essential (primary) hypertension: Secondary | ICD-10-CM | POA: Diagnosis not present

## 2019-05-01 DIAGNOSIS — R42 Dizziness and giddiness: Secondary | ICD-10-CM

## 2019-05-01 MED ORDER — LISINOPRIL 10 MG PO TABS: 10.0000 mg | ORAL_TABLET | Freq: Every day | ORAL | 3 refills | Status: DC

## 2019-05-01 MED ORDER — ONDANSETRON HCL 4 MG PO TABS: 4.0000 mg | ORAL_TABLET | Freq: Three times a day (TID) | ORAL | 3 refills | Status: DC | PRN

## 2019-05-01 NOTE — Assessment & Plan Note (Signed)
We discussed healthy eating habits, moderate physical activity for 30 minutes 5 times per week (or 150 minutes), safe sex practices, avoiding tobacco products, safe alcohol consumption, and safe driving habits. Mammogram ordered.

## 2019-05-01 NOTE — Progress Notes (Signed)
  Patient Name: Sarah Walls Date of Birth: Aug 02, 1960 Date of Visit: 05/01/19 PCP: Patriciaann Clan, DO  Chief Complaint: check in on medications and annual exam  Subjective: Sarah Walls is a pleasant 58 y.o. with medical history significant for type 2 diabetes and hypertension  presenting today for check up .  Mnire's disease The patient reports a longstanding history of Mnire's disease.  This started in 2015.  She has associated left-sided hearing changes at times.  Her symptoms are nausea and dizziness which are episodic in nature.  This frequently flares during times of stress as well as the time.  Leading up to the election.  She actually lost several pounds due to medication change and due to her ongoing Mnire's symptoms in October.  She reports her nausea and vomiting has stopped.  She continues to have intermittent dizziness.  Prior evaluation includes to ENT consultations.  She had a prior audiogram in 2018 which was type a in both.  ENT recommended as needed Xanax for her symptoms at that time.  She reports that Zofran actually helps her the most.  Hypertension Patient has not had a refill of her lisinopril.  Is been a year since her last metabolic panel.  She denies chest pain, headaches, vision changes.  She reports she feels overall well.  Type 2 diabetes The patient follows with endocrinology for this, she is currently doing well with this.  Tachycardia This problem has been present for years. No chest pain, palpitations, dyspnea.    Healthcare maintenance The patient continues to see her psychiatrist for her mental health.  She feels some extra stress in the setting of the pandemic and the election season.  She reports this is slightly improved now.  She is up-to-date on her Pap smear.  She is due for mammogram.  She is up-to-date on her colonoscopy.  She received her influenza vaccine at her endocrinologist   ROS: Per HPI.   I have reviewed the patient's medical,  surgical, family, and social history as appropriate.  Vitals:   05/01/19 0843  BP: (!) 152/88  Pulse: (!) 124  SpO2: 94%    HEENT: Sclera anicteric. Dentition is moderate. Appears well hydrated. Neck: Supple Cardiac: Tachycardic rate and rhythm. Normal S1/S2. No murmurs, rubs, or gallops appreciated. Lungs: Clear bilaterally to ascultation.  Skin: Warm, dry Psych: Pleasant and appropriate   Prior EKG reviewed which showed normal sinus rhythm.   Essential hypertension Restarted lisinopril.  Repeat BMP today.  Tachycardia Ongoing problem, asymptomatic.  Patient reports is always been heart rate she monitors it closely.  We will continue to monitor consider echocardiogram referral to cardiology in the future.  HLD (hyperlipidemia) Repeat lipid panel today.  Healthcare maintenance We discussed healthy eating habits, moderate physical activity for 30 minutes 5 times per week (or 150 minutes), safe sex practices, avoiding tobacco products, safe alcohol consumption, and safe driving habits. Mammogram ordered.   Return to care in 1 year with PCP.   Dorris Singh, MD  Family Medicine Teaching Service

## 2019-05-01 NOTE — Assessment & Plan Note (Signed)
Ongoing problem, asymptomatic.  Patient reports is always been heart rate she monitors it closely.  We will continue to monitor consider echocardiogram referral to cardiology in the future.

## 2019-05-01 NOTE — Assessment & Plan Note (Signed)
Repeat lipid panel today. 

## 2019-05-01 NOTE — Assessment & Plan Note (Signed)
Restarted lisinopril.  Repeat BMP today.

## 2019-05-01 NOTE — Assessment & Plan Note (Signed)
Ongoing problem, unchanged for many years.  We discussed at length.  This affects her quality of life and ability to work.  Recommended she reestablish care with ENT.  Zofran refilled as this reduces vomiting associated with her episodes.  We discussed strict return precautions.

## 2019-05-01 NOTE — Patient Instructions (Addendum)
It was wonderful to see you today.  Thank you for choosing [facility].   Please call [phone] with any questions about today's appointment.  Please be sure to schedule follow up at the front  desk before you leave today.   Dorris Singh, MD  Family Medicine   I recommend  1. Scheduling a mammogram at your earliest convenience  2. Reach out to ENT if you do not hear from them about an appointment   I will call you with blood work

## 2019-05-02 ENCOUNTER — Telehealth: Payer: Self-pay | Admitting: Family Medicine

## 2019-05-02 DIAGNOSIS — E785 Hyperlipidemia, unspecified: Secondary | ICD-10-CM

## 2019-05-02 LAB — BASIC METABOLIC PANEL
BUN/Creatinine Ratio: 12 (ref 12–28)
BUN: 6 mg/dL — ABNORMAL LOW (ref 8–27)
CO2: 24 mmol/L (ref 20–29)
Calcium: 10 mg/dL (ref 8.7–10.3)
Chloride: 103 mmol/L (ref 96–106)
Creatinine, Ser: 0.51 mg/dL — ABNORMAL LOW (ref 0.57–1.00)
GFR calc Af Amer: 121 mL/min/{1.73_m2} (ref >59)
GFR calc non Af Amer: 105 mL/min/{1.73_m2} (ref >59)
Glucose: 237 mg/dL — ABNORMAL HIGH (ref 65–99)
Potassium: 5 mmol/L (ref 3.5–5.2)
Sodium: 141 mmol/L (ref 134–144)

## 2019-05-02 LAB — LIPID PANEL
Chol/HDL Ratio: 3.3 ratio (ref 0.0–4.4)
Cholesterol, Total: 190 mg/dL (ref 100–199)
HDL: 57 mg/dL (ref >39)
LDL Chol Calc (NIH): 105 mg/dL — ABNORMAL HIGH (ref 0–99)
Triglycerides: 163 mg/dL — ABNORMAL HIGH (ref 0–149)
VLDL Cholesterol Cal: 28 mg/dL (ref 5–40)

## 2019-05-02 MED ORDER — ATORVASTATIN CALCIUM 80 MG PO TABS: 80.0000 mg | ORAL_TABLET | Freq: Every day | ORAL | 3 refills | Status: DC

## 2019-05-02 NOTE — Telephone Encounter (Signed)
Called patient to discuss labs, as ASCVD is elevated, maximized statin dose. Discussed increasing statin given LDL and risk factors. 80 mg nightly prescribed, will recheck LDL in January.   The 10-year ASCVD risk score Sarah Walls., et al., 2013) is: 11%   Values used to calculate the score:     Age: 58 years     Sex: Female     Is Non-Hispanic African American: No     Diabetic: Yes     Tobacco smoker: No     Systolic Blood Pressure: 0000000 mmHg     Is BP treated: Yes     HDL Cholesterol: 57 mg/dL     Total Cholesterol: 190 mg/dL

## 2019-05-02 NOTE — Telephone Encounter (Signed)
Called patient with results. Left voicemail---overall results look good, continue current medications.  ASCVD 11%. She is on atorvastatin 40 mg, could consider increase to 80 mg. Will send mychart.   Dorris Singh, MD  Family Medicine Teaching Service

## 2019-05-02 NOTE — Addendum Note (Signed)
Addended by: Owens Shark, CARINA on: 05/02/2019 02:49 PM   Modules accepted: Orders

## 2019-05-02 NOTE — Telephone Encounter (Signed)
Patient calls nurse line returning [place] call. Informed patient of result note and possible need to increase atorvastatin if interested. Patient would like to discuss with provider. Informed her we would reach out via mychart. Patient ok with this.

## 2019-05-08 ENCOUNTER — Telehealth (INDEPENDENT_AMBULATORY_CARE_PROVIDER_SITE_OTHER): Payer: BC Managed Care – PPO | Admitting: Family Medicine

## 2019-05-08 DIAGNOSIS — J019 Acute sinusitis, unspecified: Secondary | ICD-10-CM

## 2019-05-08 DIAGNOSIS — B9789 Other viral agents as the cause of diseases classified elsewhere: Secondary | ICD-10-CM

## 2019-05-08 NOTE — Progress Notes (Signed)
[place] Telemedicine Visit  Patient consented to have virtual visit. Method of visit: Video  Encounter participants: Patient: Sarah Walls - located at home Provider: Bonnita Hollow - located at office Others (if applicable): None  Chief Complaint: Sore throat  HPI: Patient reports sore throat since last Friday.  She has had lots of nasal drainage with this.  Also reports some tenderness over her maxillary sinuses.  Patient has been taking Tamiflu for symptoms.  Denies any fevers, shortness of breath, chills, cough, exposure to anyone with URI type symptoms or COVID-19.  Is not getting better, it is not getting worse.  ROS: per HPI  Pertinent PMHx: Diabetes mellitus  Exam:  General: No acute distress Skin: No rashes on face Respiratory: Normal work of breathing Neuro: No facial asymmetry, moves limbs spontaneously Psych: Normal affect and thought content  Assessment/Plan: Viral sinusitis Patient likely has mild case of viral sinusitis.  Recommended supportive care with Tylenol, nasal saline rinse.  May use Afrin for a few days if nasal congestion is worsening.  Suspect course will improve within the next few days.  If no improvement by the end of week, recommend patient call back.  Return precautions discussed.  Time spent during visit with patient: 9 minutes

## 2019-05-31 DIAGNOSIS — F3181 Bipolar II disorder: Secondary | ICD-10-CM | POA: Diagnosis not present

## 2019-06-26 NOTE — Telephone Encounter (Signed)
Patient recently started on statin.  Nursing please call patient and help her to schedule lab appointment to check cholesterol. Let me know if she has questions.  Thanks, Dorris Singh, MD  [facility] Medicine Teaching Service

## 2019-06-26 NOTE — Addendum Note (Signed)
Addended by: Owens Shark, Emmajane Altamura on: 06/26/2019 04:27 PM   Modules accepted: Orders

## 2019-06-27 NOTE — Telephone Encounter (Signed)
Called patient and tried to schedule a lab visit.  Patient is currently at work and will have to check her calender when she gets home and will schedule later.  Sarah Walls, [place]

## 2019-06-29 ENCOUNTER — Other Ambulatory Visit: Payer: BC Managed Care – PPO

## 2019-06-29 ENCOUNTER — Ambulatory Visit: Payer: BC Managed Care – PPO | Admitting: Internal Medicine

## 2019-08-02 ENCOUNTER — Other Ambulatory Visit: Payer: Self-pay

## 2019-08-02 MED ORDER — ONDANSETRON HCL 4 MG PO TABS: 4.0000 mg | ORAL_TABLET | Freq: Three times a day (TID) | ORAL | 1 refills | Status: DC | PRN

## 2019-08-04 ENCOUNTER — Encounter: Payer: Self-pay | Admitting: Internal Medicine

## 2019-08-04 ENCOUNTER — Ambulatory Visit (INDEPENDENT_AMBULATORY_CARE_PROVIDER_SITE_OTHER): Payer: BC Managed Care – PPO | Admitting: Internal Medicine

## 2019-08-04 DIAGNOSIS — Z794 Long term (current) use of insulin: Secondary | ICD-10-CM

## 2019-08-04 DIAGNOSIS — E113291 Type 2 diabetes mellitus with mild nonproliferative diabetic retinopathy without macular edema, right eye: Secondary | ICD-10-CM | POA: Diagnosis not present

## 2019-08-04 DIAGNOSIS — E785 Hyperlipidemia, unspecified: Secondary | ICD-10-CM | POA: Diagnosis not present

## 2019-08-04 MED ORDER — GLIPIZIDE 5 MG PO TABS: 5.0000 mg | ORAL_TABLET | Freq: Every day | ORAL | 3 refills | Status: DC

## 2019-08-04 NOTE — Patient Instructions (Addendum)
Please continue: - Metformin 1000 mg twice a day with meals - Jardiance 25 mg before breakfast - Levemir 60 units in am - Trulicity 3 mg weekly in a.m.  Please start: - Glipizide 5 mg 15-30 min before b'fast  Please return in 3-4 months with your sugar log.

## 2019-08-04 NOTE — Progress Notes (Signed)
Patient ID: Sarah Walls, female   DOB: [birth year], 59 y.o.   MRN: [id number]   Patient location: Home My location: Office Persons participating in the virtual visit: patient, provider  Referring Provider: Patriciaann Clan, DO  I connected with the patient on 08/04/19 at  4:00 PM EST by a video enabled telemedicine application and verified that I am speaking with the correct person.   I discussed the limitations of evaluation and management by telemedicine and the availability of in person appointments. The patient expressed understanding and agreed to proceed.   Details of the encounter are shown below.  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 Last visit 4 months ago.  Reviewed HbA1c levels: Lab Results  Component Value Date   HGBA1C 8.8 (A) 03/21/2019   HGBA1C 10.1 (A) 08/10/2018   HGBA1C 8.1 (A) 03/04/2018   HGBA1C 8.3 10/29/2017   HGBA1C 9.4 07/23/2017   HGBA1C 7.6 02/12/2017   HGBA1C 8.8 11/27/2016   HGBA1C 9.2 (H) 09/15/2016   HGBA1C 8.5 (H) 04/27/2016   HGBA1C 8.6 (H) 01/27/2016   She is on: - Metformin 2000 mg with dinner >> ER 1000 mg 2x a day (may forget second dose) - Jardiance 25 mg before breakfast - Trulicity 1.5 >> 3 mg weekly in a.m. - a little AP >> resolved - Tresiba >> Levemir 70 units in at bedtime >>  40 units 2x a day >> 60 units in a.m. as she was forgetting the second dose of the day Unfortunately, 3 weeks before last visit, she came off Jardiance as she could not refill it We stopped Januvia when we started Trulicity.  Pt checks her sugars 1-2 times a day: - am: 155-190 >> 180-210 >> 156-197 >> 113-150 - 2h after b'fast: 180-228, 257 >> 202-238 >> 210-230 - before lunch: 213 > 204-211 >> n/c >> 174 >> n/c - 2h after lunch: n/c >> 226-248, 328 >> n/c >> 212 >> n/c - before dinner: n/c >> 189 >> 195-215 >> 179 >> n/c - 2h after  dinner: 172-253 >> 220 >> n/c >> 203 >> n/c - bedtime:170-223 >> n/c >> 190-220, 270 >> n/c >> 128-204 - nighttime: n/c Lowest sugar was 156; she has hypoglycemia awareness in the 70s. Highest sugar was 238  Glucometer: AccuChek  Pt's meals are: - Breakfast: b'fast sandwich + yoghurt; boiled eggs + slice of bacon + yoghurt - Lunch: frozen meal +/- salad - Dinner: frozen meals - Snacks: zucchini bread, apple  -No CKD, last BUN/creatinine:  Lab Results  Component Value Date   BUN 6 (L) 05/01/2019   BUN 8 03/04/2018   CREATININE 0.51 (L) 05/01/2019   CREATININE 0.58 03/04/2018  On lisinopril. -+ HL; last set of lipids: Lab Results  Component Value Date   CHOL 190 05/01/2019   HDL 57 05/01/2019   LDLCALC 105 (H) 05/01/2019   LDLDIRECT 140.1 10/06/2011   TRIG 163 (H) 05/01/2019   CHOLHDL 3.3 05/01/2019  On Lipitor 40. - last eye exam was in 02/2019:?  DR, prev. + Mild  DR; she has a history of cataract surgery in 2016. - she denies numbness and tingling in her feet.  ROS: Constitutional: no weight gain/no weight loss, no fatigue, no subjective hyperthermia, no subjective hypothermia Eyes: no blurry vision, no xerophthalmia ENT: no sore throat, no nodules palpated in neck, no dysphagia, no odynophagia, no hoarseness Cardiovascular: no CP/no SOB/no palpitations/no  leg swelling Respiratory: no cough/no SOB/no wheezing Gastrointestinal: no N/no V/no D/no C/no acid reflux Musculoskeletal: no muscle aches/no joint aches Skin: no rashes, no hair loss Neurological: no tremors/no numbness/no tingling/no dizziness  I reviewed pt's medications, allergies, PMH, social hx, family hx, and changes were documented in the history of present illness. Otherwise, unchanged from my initial visit note.  Past Medical History:  Diagnosis Date  . Allergy   . Arthritis   . Bipolar disorder ([place])   . Cataract   . Depression    BAD2 - Dr. Albertine Patricia  . Diabetes mellitus without complication ([place])    . Hyperlipidemia   . Hypertension   . Panic attacks    BAD2 - Dr. Albertine Patricia  . Personal history of colonic polyps - adenomas 10/13/2013   12/2013 - 6 adenomas max 12 mm - repeat colonoscopy  2018   . Tuberculosis    positive PPD   Past Surgical History:  Procedure Laterality Date  . CATARACT EXTRACTION Bilateral 2015  . CESAREAN SECTION  1990   Fetal Distress  . COLONOSCOPY    . EYE SURGERY    . POLYPECTOMY    . TONSILLECTOMY  1969   Social History   Social History  . Marital status: Married    Spouse name: N/A  . Number of children: 1   Occupational History  . 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    Social History Main Topics  . Smoking status: Never Smoker  . Smokeless tobacco: Never Used  . Alcohol use No  . Drug use: No   Social History Narrative   05/2009 Masters in counseling in disabled.   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       Mother lived in home.  (Deceased quickly Spring 09 from metastatic melanoma)   Current Outpatient Medications on File Prior to Visit  Medication Sig Dispense Refill  . ACCU-CHEK FASTCLIX LANCETS MISC Check blood 1 - 2 times daily and as directed. Dx E11.65 200 each 1  . ACCU-CHEK GUIDE test strip CHECK BLOOD 1 - 2 TIMES DAILY AND AS DIRECTED. DX E11.65 200 strip 1  . ALPRAZolam (XANAX) 0.5 MG tablet Take 0.5 mg by mouth at bedtime as needed (for panic attacks.). May take up to 4 tablets per day as needed    . aspirin 81 MG tablet Take 81 mg by mouth daily.    Marland Kitchen atorvastatin (LIPITOR) 80 MG tablet Take 1 tablet (80 mg total) by mouth daily at 6 PM. 90 tablet 3  . chlorhexidine (PERIDEX) 0.12 % solution   3  . desvenlafaxine (PRISTIQ) 100 MG 24 hr tablet Take 100 mg by mouth daily.    . Dulaglutide (TRULICITY) 3 0000000 SOPN Inject 3 mg into the skin once a week. 4 pen 2  . empagliflozin (JARDIANCE) 25 MG TABS tablet Take 25 mg by mouth daily. 90 tablet 3  .  Insulin Detemir (LEVEMIR FLEXTOUCH) 100 UNIT/ML Pen INJECT 70 UNITS INTO THE SKIN DAILY; PT REQUESTS 90 DAY SUPPLY 20 pen 1  . Insulin Pen Needle (UNIFINE PENTIPS) 31G X 5 MM MISC USE DAILY WITH INSULIN PEN 100 each 4  . lamoTRIgine (LAMICTAL) 150 MG tablet Take 150 mg by mouth daily.    Marland Kitchen lamoTRIgine (LAMICTAL) 25 MG tablet Take 25 mg by mouth as needed.    Marland Kitchen lisinopril (ZESTRIL) 10 MG tablet Take 1 tablet (10 mg total) by mouth at bedtime. TAKE 1 TABLET BY MOUTH  ONCE DAILY (NEED ANNUAL PHYSICAL FOR FUTHER REFILLS) 90 tablet 3  . metFORMIN (GLUCOPHAGE-XR) 500 MG 24 hr tablet Take 2 tablets (1,000 mg total) by mouth 2 (two) times daily with a meal. 360 tablet 3  . metoprolol tartrate (LOPRESSOR) 25 MG tablet TAKE 0.5 TABLETS BY MOUTH DAILY. 45 tablet 1  . Multiple Vitamin (MULTIVITAMIN) tablet Take 1 tablet by mouth daily.      . ondansetron (ZOFRAN) 4 MG tablet Take 1 tablet (4 mg total) by mouth every 8 (eight) hours as needed for nausea or vomiting. 30 tablet 1   No current facility-administered medications on file prior to visit.   Allergies  Allergen Reactions  . Beta Adrenergic Blockers Other (See Comments)    Drowsy, fatigue   Family History  Problem Relation Age of Onset  . Cancer Mother        Metastatic liver CA  . Thyroid disease Mother        Hypothyroidism  . Vision loss Mother        Eye tumor, removed orbit at the end of 2004  . Melanoma Mother   . Diabetes Mother   . Diabetes Father   . Heart disease Father        CABG, coronary disease  . Thyroid disease Sister        Hypothyroidism  . Colon cancer Maternal Aunt 63  . Breast cancer Neg Hx   . Colon polyps Neg Hx   . Esophageal cancer Neg Hx   . Rectal cancer Neg Hx   . Stomach cancer Neg Hx    PE: LMP 08/28/2015 (Approximate)  Wt Readings from Last 3 Encounters:  05/01/19 268 lb 3.2 oz (121.7 kg)  03/21/19 263 lb 12.8 oz (119.7 kg)  08/10/18 269 lb (122 kg)   Constitutional:  in NAD  The physical exam  was not performed (virtual visit).  ASSESSMENT: 1. 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 Obesity class 3  3. HL  PLAN:  1. Patient with longstanding, uncontrolled, type 2 diabetes, on oral antidiabetic regimen with Metformin, SGLT2 inhibitor, also daily long-acting insulin and weekly GLP-1 receptor agonist with improved control. -At last visit sugars were still high but better.  She was having problems remembering her medicines we had to move the entire Levemir dose A1 dose so she can remember to take it.  She was also having problems with nausea/vomiting/vertigo as a part of Mnire's disease and actually gotten FMLA from work for this.  She was trying to adjust her diet to a more plant-based diet, but she was having IBS and problems tolerating some of the foods.  At that time, I suggested an increasing Trulicity dose. She is tolerating this well and her sugars improved on this.  As of now, she is working on improving her day by organizing how she takes her medicines and also organizing her meals.  She is doing a good job with this.  However, sugars are still high after breakfast and, in an effort to avoid having to start mealtime insulin, we discussed about looking at breakfast and see if she can identify possible culprits for the postmeal hyperglycemia.  She feels that this is mostly related to stress from starting her job in the morning.  I advised her to start a low-dose glipizide before breakfast but I did advise her to check some sugars before dinner to see if she will need glipizide later in the day, also. - I suggested to:  Patient Instructions  Please continue: - Metformin 1000 mg twice a day with meals - Jardiance 25 mg before breakfast - Levemir 60 units in am - Trulicity 3 mg weekly in a.m.  Please start: - Glipizide 5 mg 15-30 min before b'fast  Please return in 3-4 months with your sugar log.   - we will recheck her HbA1c when she returns to  the clinic - advised to check sugars at different times of the day - 1-2x a day, rotating check times - advised for yearly eye exams >> she is UTD - return to clinic in 3-4 months      2. Obesity class 3 -Continue Trulicity and Jardiance which should also help with weight loss.  At last visit we increase Trulicity dose. -She had great success on a plant-based diet in the past but did not want to retry it so I suggested weight watchers on the whole 30 diet -Before last visit, she lost 6 pounds.  Weight stable now.  3. HL -Reviewed latest lipid panel from 04/2019: LDL slightly above goal, triglycerides slightly high, HDL at goal Lab Results  Component Value Date   CHOL 190 05/01/2019   HDL 57 05/01/2019   LDLCALC 105 (H) 05/01/2019   LDLDIRECT 140.1 10/06/2011   TRIG 163 (H) 05/01/2019   CHOLHDL 3.3 05/01/2019  -Continues Lipitor 40 without side effects.  Philemon Kingdom, MD PhD [facility] Endocrinology

## 2019-08-25 ENCOUNTER — Telehealth: Payer: Self-pay | Admitting: Internal Medicine

## 2019-08-25 MED ORDER — GLIPIZIDE 5 MG PO TABS: 5.0000 mg | ORAL_TABLET | Freq: Every day | ORAL | 3 refills | Status: DC

## 2019-08-25 NOTE — Addendum Note (Signed)
Addended by: Cardell Peach I on: 08/25/2019 11:33 AM   Modules accepted: Orders

## 2019-08-25 NOTE — Telephone Encounter (Signed)
MEDICATION: glipizide  PHARMACY:   [id number] [place], [place] - [address] Phone:  [phone]  Fax:  [phone]     IS THIS A 90 DAY SUPPLY : yes  IS PATIENT OUT OF MEDICATION: yes  IF NOT; HOW MUCH IS LEFT:   LAST APPOINTMENT DATE: 08/04/2019  NEXT APPOINTMENT DATE:  DO WE HAVE YOUR PERMISSION TO LEAVE A DETAILED MESSAGE: yes  OTHER COMMENTS: Patient says the pharmacy never received the RX sent on 08/04/19 - this is a new RX   **Let patient know to contact pharmacy at the end of the day to make sure medication is ready. **  ** Please notify patient to allow 48-72 hours to process**  **Encourage patient to contact the pharmacy for refills or they can request refills through [facility]**

## 2019-08-25 NOTE — Telephone Encounter (Signed)
RX was sent with confirmation in Feb:  glipiZIDE (GLUCOTROL) 5 MG tablet 90 tablet 3 08/04/2019    Sig - Route: Take 1 tablet (5 mg total) by mouth daily before breakfast. - Oral   Sent to pharmacy as: glipiZIDE (GLUCOTROL) 5 MG tablet   E-Prescribing Status: Receipt confirmed by pharmacy (08/04/2019 4:17 PM EST)   Pharmacy  [id number] - [place], [place] - [address]   I will send it again though

## 2019-09-22 ENCOUNTER — Other Ambulatory Visit: Payer: Self-pay | Admitting: Internal Medicine

## 2019-09-27 ENCOUNTER — Other Ambulatory Visit: Payer: Self-pay | Admitting: Internal Medicine

## 2019-10-04 ENCOUNTER — Other Ambulatory Visit: Payer: Self-pay

## 2019-10-04 ENCOUNTER — Ambulatory Visit (INDEPENDENT_AMBULATORY_CARE_PROVIDER_SITE_OTHER): Payer: BC Managed Care – PPO | Admitting: Family Medicine

## 2019-10-04 ENCOUNTER — Encounter: Payer: Self-pay | Admitting: Family Medicine

## 2019-10-04 DIAGNOSIS — L304 Erythema intertrigo: Secondary | ICD-10-CM | POA: Diagnosis not present

## 2019-10-04 MED ORDER — TERBINAFINE HCL 1 % EX CREA: 1.0000 "application " | TOPICAL_CREAM | Freq: Two times a day (BID) | CUTANEOUS | 1 refills | Status: DC

## 2019-10-04 NOTE — Assessment & Plan Note (Addendum)
No signs of lamictal rash or secondary bacterial infection.  Treat with terbinafine Rx. See after visit summary of plan

## 2019-10-04 NOTE — Patient Instructions (Signed)
Good to see you today!  Thanks for coming in.  You have Intertrigo  The best therapy is drying out the areas.  Dry towels and hair dryer no heat setting several times a day  Main treatment is lamisil cream (terbenafine)  - twice a day until all gone  Use the cortisone 10 alternating every 6 hours for two more Days  If not better in 4-5 days or gone in 2 weeks or if worsening with fever or spreading rash then contact us

## 2019-10-04 NOTE — Progress Notes (Signed)
    SUBJECTIVE:   CHIEF COMPLAINT / HPI:   RASH  Had rash for last week or so  Location: under breasts and pannus Medications tried: cortaid and desiten  New medications or antibiotics: no  Does take Lamictal for many years Tick, Insect or new pet exposure: no Recent travel: no New detergent or soap: no Immunocompromised: no  Has diabetes   Symptoms Itching: no Pain over rash: no Feeling ill all over: no Fever: no Mouth sores: no Face or tongue swelling: no Trouble breathing: no Joint swelling or pain: no    PERTINENT  PMH / PSH: diabetes started new job  OBJECTIVE:   BP (!) 146/82   Pulse (!) 117   Ht 5\' 7"  (1.702 m)   Wt 261 lb 9.6 oz (118.7 kg)   LMP 08/28/2015 (Approximate)   SpO2 97%   BMI 40.97 kg/m   Rash is red irritated under breasts and pannus Mild surrounding erythema.  No discharge Remainder of skin is normal MM are normal   ASSESSMENT/PLAN:   Intertrigo No signs of lamictal rash or secondary bacterial infection.  See after visit summary of plan      Lind Covert, MD [place]

## 2019-10-14 ENCOUNTER — Other Ambulatory Visit: Payer: Self-pay | Admitting: Internal Medicine

## 2019-10-16 ENCOUNTER — Telehealth: Payer: Self-pay

## 2019-10-16 NOTE — Telephone Encounter (Signed)
Insurance does not cover [employer] prefers:  Lantus and Toujeo  Please advise if you would like to change RX or do PA.

## 2019-10-17 MED ORDER — TOUJEO MAX SOLOSTAR 300 UNIT/ML ~~LOC~~ SOPN: 70.0000 [IU] | PEN_INJECTOR | Freq: Every day | SUBCUTANEOUS | 1 refills | Status: DC

## 2019-10-17 NOTE — Telephone Encounter (Signed)
OK to use Toujeo at the same dose, but she may need to increase it by few units if sugars increase within the next few days after the switch.

## 2019-10-17 NOTE — Telephone Encounter (Signed)
RX sent

## 2019-11-17 ENCOUNTER — Ambulatory Visit
Admission: RE | Admit: 2019-11-17 | Discharge: 2019-11-17 | Disposition: A | Discharge status: Home / Self Care | Payer: 59 | Source: Ambulatory Visit | Attending: Family Medicine | Admitting: Family Medicine

## 2019-11-17 ENCOUNTER — Other Ambulatory Visit: Payer: Self-pay

## 2019-11-17 DIAGNOSIS — Z1231 Encounter for screening mammogram for malignant neoplasm of breast: Secondary | ICD-10-CM

## 2019-11-28 ENCOUNTER — Ambulatory Visit: Payer: BC Managed Care – PPO | Admitting: Internal Medicine

## 2020-02-13 ENCOUNTER — Ambulatory Visit: Payer: 59 | Admitting: Internal Medicine

## 2020-02-13 ENCOUNTER — Encounter: Payer: Self-pay | Admitting: Internal Medicine

## 2020-02-13 ENCOUNTER — Other Ambulatory Visit: Payer: Self-pay

## 2020-02-13 VITALS — BP 128/78 | HR 128 | Ht 67.0 in | Wt 254.0 lb

## 2020-02-13 DIAGNOSIS — E113291 Type 2 diabetes mellitus with mild nonproliferative diabetic retinopathy without macular edema, right eye: Secondary | ICD-10-CM

## 2020-02-13 DIAGNOSIS — E785 Hyperlipidemia, unspecified: Secondary | ICD-10-CM | POA: Diagnosis not present

## 2020-02-13 DIAGNOSIS — Z794 Long term (current) use of insulin: Secondary | ICD-10-CM

## 2020-02-13 LAB — POCT GLYCOSYLATED HEMOGLOBIN (HGB A1C): Hemoglobin A1C: 8.9 % — AB (ref 4.0–5.6)

## 2020-02-13 MED ORDER — TOUJEO MAX SOLOSTAR 300 UNIT/ML ~~LOC~~ SOPN: 20.0000 [IU] | PEN_INJECTOR | Freq: Every day | SUBCUTANEOUS | 3 refills | Status: DC

## 2020-02-13 MED ORDER — TRULICITY 3 MG/0.5ML ~~LOC~~ SOAJ: 3.0000 mg | SUBCUTANEOUS | 11 refills | Status: DC

## 2020-02-13 MED ORDER — BD PEN NEEDLE MINI U/F 31G X 5 MM MISC
3 refills | Status: DC
Start: 2020-02-13 — End: 2021-07-04

## 2020-02-13 MED ORDER — EMPAGLIFLOZIN 25 MG PO TABS
25.0000 mg | ORAL_TABLET | Freq: Every day | ORAL | 3 refills | Status: DC
Start: 2020-02-13 — End: 2020-03-06

## 2020-02-13 NOTE — Patient Instructions (Addendum)
Please continue: - Metformin 1000 mg twice a day with meals - Glipizide 5 mg 15-30 min before breakfast - Jardiance 25 mg before breakfast  Please restart: - Trulicity 3 mg weekly - Toujeo 20 units at bedtime (you can titrate the dose up by 4 units every 4 days until sugars in am <140  Please return in 3 months with your sugar log.

## 2020-02-13 NOTE — Progress Notes (Signed)
Patient ID: Sarah Walls, female   DOB: Mar 18, 1961, 59 y.o.   MRN: [id number]   This visit occurred during the SARS-CoV-2 public health emergency.  Safety protocols were in place, including screening questions prior to the visit, additional usage of staff PPE, and extensive cleaning of exam room while observing appropriate contact time as indicated for disinfecting solutions.   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 Last visit 6.5 months ago (virtual).  At this visit, she told me that for 3 months before the current month, she was off her GLP-1 receptor agonist and her insulin due to changing her insurance.  With any insurance, she can now restart them.  Reviewed HbA1c levels: Lab Results  Component Value Date   HGBA1C 8.8 (A) 03/21/2019   HGBA1C 10.1 (A) 08/10/2018   HGBA1C 8.1 (A) 03/04/2018   HGBA1C 8.3 10/29/2017   HGBA1C 9.4 07/23/2017   HGBA1C 7.6 02/12/2017   HGBA1C 8.8 11/27/2016   HGBA1C 9.2 (H) 09/15/2016   HGBA1C 8.5 (H) 04/27/2016   HGBA1C 8.6 (H) 01/27/2016   She is on: - Metformin 2000 mg with dinner >> ER 1000 mg 2x a day (may forget second dose) - Glipizide 5 mg before breakfast-added02/2021 - Jardiance 25 mg before breakfast - Trulicity 1.5 >> 3 mg weekly in a.m. >> STOPPED for 3 mo 2/2 insurance pbs  - Tresiba >> Levemir 70 units in at bedtime >>  40 units 2x a day >> 60 units in a.m. as she was forgetting the second dose of the day >> Toujeo 60 units in a.m.>> STOPPED for 3 mo 2/2 insurance pbs Unfortunately, 3 weeks before last visit, she came off Jardiance as she could not refill it We stopped Januvia when we started Trulicity.  Pt checks her sugars 1-2 times a day: - am: 155-190 >> 180-210 >> 156-197 >> 113-150 >> 146-226 - 2h after b'fast: 180-228, 257 >> 202-238 >> 210-230 >> 174--240 - before lunch: 213 > 204-211 >> n/c >> 174 >> n/c -  2h after lunch: n/c >> 226-248, 328 >> n/c >> 212 >> n/c - before dinner: n/c >> 189 >> 195-215 >> 179 >> n/c  - 2h after dinner: 172-253 >> 220 >> n/c >> 203 >> n/c >> 225 - bedtime:170-223 >> n/c >> 190-220, 270 >> n/c >> 128-204 >> 190-270 - nighttime: n/c Lowest sugar was 156 >> 156; she has hypoglycemia awareness in the 70s. Highest sugar was 238 >> 270  Glucometer: AccuChek  Pt's meals are: - Breakfast: b'fast sandwich + yoghurt; boiled eggs + slice of bacon + yoghurt - Lunch: frozen meal +/- salad - Dinner: frozen meals - Snacks: zucchini bread, apple  -No CKD, last BUN/creatinine:  Lab Results  Component Value Date   BUN 6 (L) 05/01/2019   BUN 8 03/04/2018   CREATININE 0.51 (L) 05/01/2019   CREATININE 0.58 03/04/2018  On lisinopril. -+ HL; last set of lipids: Lab Results  Component Value Date   CHOL 190 05/01/2019   HDL 57 05/01/2019   LDLCALC 105 (H) 05/01/2019   LDLDIRECT 140.1 10/06/2011   TRIG 163 (H) 05/01/2019   CHOLHDL 3.3 05/01/2019  On Lipitor 40. - last eye exam was in 02/2019: no DR, prev. + Mild  DR; she has a history of cataract surgery in 2016. - no numbness and tingling in her feet.  ROS: Constitutional: no weight gain/+ weight loss, no fatigue,  no subjective hyperthermia, no subjective hypothermia Eyes: no blurry vision, no xerophthalmia ENT: no sore throat, no nodules palpated in neck, no dysphagia, no odynophagia, no hoarseness Cardiovascular: no CP/no SOB/no palpitations/no leg swelling Respiratory: no cough/no SOB/no wheezing Gastrointestinal: no N/no V/no D/no C/no acid reflux Musculoskeletal: no muscle aches/no joint aches Skin: no rashes, no hair loss Neurological: no tremors/no numbness/no tingling/no dizziness  I reviewed pt's medications, allergies, PMH, social hx, family hx, and changes were documented in the history of present illness. Otherwise, unchanged from my initial visit note.  Past Medical History:  Diagnosis Date  .  Allergy   . Arthritis   . Bipolar disorder ([place])   . Cataract   . Depression    BAD2 - Dr. Albertine Patricia  . Diabetes mellitus without complication ([place])   . Hyperlipidemia   . Hypertension   . Panic attacks    BAD2 - Dr. Albertine Patricia  . Personal history of colonic polyps - adenomas 10/13/2013   12/2013 - 6 adenomas max 12 mm - repeat colonoscopy  2018   . Tuberculosis    positive PPD   Past Surgical History:  Procedure Laterality Date  . CATARACT EXTRACTION Bilateral 2015  . CESAREAN SECTION  1990   Fetal Distress  . COLONOSCOPY    . EYE SURGERY    . POLYPECTOMY    . TONSILLECTOMY  1969   Social History   Social History  . Marital status: Married    Spouse name: N/A  . Number of children: 1   Occupational History  . 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    Social History Main Topics  . Smoking status: Never Smoker  . Smokeless tobacco: Never Used  . Alcohol use No  . Drug use: No   Social History Narrative   05/2009 Masters in counseling in disabled.   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       Mother lived in home.  (Deceased quickly Spring 09 from metastatic melanoma)   Current Outpatient Medications on File Prior to Visit  Medication Sig Dispense Refill  . ACCU-CHEK FASTCLIX LANCETS MISC Check blood 1 - 2 times daily and as directed. Dx E11.65 200 each 1  . ACCU-CHEK GUIDE test strip CHECK BLOOD 1 - 2 TIMES DAILY AND AS DIRECTED. DX E11.65 200 strip 1  . ALPRAZolam (XANAX) 0.5 MG tablet Take 0.5 mg by mouth at bedtime as needed (for panic attacks.). May take up to 4 tablets per day as needed    . aspirin 81 MG tablet Take 81 mg by mouth daily.    Marland Kitchen atorvastatin (LIPITOR) 80 MG tablet Take 1 tablet (80 mg total) by mouth daily at 6 PM. 90 tablet 3  . chlorhexidine (PERIDEX) 0.12 % solution   3  . desvenlafaxine (PRISTIQ) 100 MG 24 hr tablet Take 100 mg by mouth daily.    . empagliflozin (JARDIANCE) 25  MG TABS tablet Take 25 mg by mouth daily. 90 tablet 3  . glipiZIDE (GLUCOTROL) 5 MG tablet Take 1 tablet (5 mg total) by mouth daily before breakfast. 90 tablet 3  . insulin glargine, 2 Unit Dial, (TOUJEO MAX SOLOSTAR) 300 UNIT/ML Solostar Pen Inject 70 Units into the skin daily. 63 mL 1  . Insulin Pen Needle (B-D UF III MINI PEN NEEDLES) 31G X 5 MM MISC USE DAILY WITH INSULIN PEN 100 each 3  . lamoTRIgine (LAMICTAL) 150 MG tablet Take 150 mg by mouth daily.    Marland Kitchen  lamoTRIgine (LAMICTAL) 25 MG tablet Take 25 mg by mouth as needed.    Marland Kitchen lisinopril (ZESTRIL) 10 MG tablet Take 1 tablet (10 mg total) by mouth at bedtime. TAKE 1 TABLET BY MOUTH ONCE DAILY (NEED ANNUAL PHYSICAL FOR FUTHER REFILLS) 90 tablet 3  . metFORMIN (GLUCOPHAGE-XR) 500 MG 24 hr tablet Take 2 tablets (1,000 mg total) by mouth 2 (two) times daily with a meal. 360 tablet 3  . metoprolol tartrate (LOPRESSOR) 25 MG tablet TAKE 0.5 TABLETS BY MOUTH DAILY. 45 tablet 1  . Multiple Vitamin (MULTIVITAMIN) tablet Take 1 tablet by mouth daily.      . ondansetron (ZOFRAN) 4 MG tablet Take 1 tablet (4 mg total) by mouth every 8 (eight) hours as needed for nausea or vomiting. 30 tablet 1  . terbinafine (LAMISIL) 1 % cream Apply 1 application topically 2 (two) times daily. 30 g 1  . TRULICITY 3 KZ/6.0FU SOPN INJECT 3 MG INTO THE SKIN ONCE A WEEK. 2 pen 2   No current facility-administered medications on file prior to visit.   Allergies  Allergen Reactions  . Beta Adrenergic Blockers Other (See Comments)    Drowsy, fatigue   Family History  Problem Relation Age of Onset  . Cancer Mother        Metastatic liver CA  . Thyroid disease Mother        Hypothyroidism  . Vision loss Mother        Eye tumor, removed orbit at the end of 2004  . Melanoma Mother   . Diabetes Mother   . Diabetes Father   . Heart disease Father        CABG, coronary disease  . Thyroid disease Sister        Hypothyroidism  . Colon cancer Maternal Aunt 69  .  Breast cancer Neg Hx   . Colon polyps Neg Hx   . Esophageal cancer Neg Hx   . Rectal cancer Neg Hx   . Stomach cancer Neg Hx    PE: BP 128/78   Pulse (!) 128   Ht 5\' 7"  (1.702 m)   Wt 254 lb (115.2 kg)   LMP 08/28/2015 (Approximate)   SpO2 96%   BMI 39.78 kg/m  Wt Readings from Last 3 Encounters:  02/13/20 254 lb (115.2 kg)  10/04/19 261 lb 9.6 oz (118.7 kg)  05/01/19 268 lb 3.2 oz (121.7 kg)   Constitutional: overweight, in NAD Eyes: PERRLA, EOMI, no exophthalmos ENT: moist mucous membranes, no thyromegaly, no cervical lymphadenopathy Cardiovascular: tachycardia, RR, No MRG Respiratory: CTA B Gastrointestinal: abdomen soft, NT, ND, BS+ Musculoskeletal: no deformities, strength intact in all 4 Skin: moist, warm, no rashes Neurological: no tremor with outstretched hands, DTR normal in all 4  ASSESSMENT: 1. 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 Obesity class 3  3. HL  PLAN:  1. Patient with longstanding, uncontrolled, type 2 diabetes, on oral antidiabetic regimen with Metformin, SGLT2 inhibitor and sulfonylurea added at last visit, also on long-acting insulin and weekly GLP-1 receptor agonist with still suboptimal control. -In the past, she had problems with nausea/vomiting/vertigo as a part of Mnire's disease and actually gotten FMLA from work for this.  She was trying to adjust her diet to a more plant-based diet, but she was having IBS and problems tolerating some of the foods.  At last visit, she was telling me that she cannot restart to the plant-based diet so we discussed about switching to a diet that she  can actually follow.  However, sugars are high after meals especially after breakfast and, in an effort to avoid mealtime insulin, I advised her to add glipizide in the morning.  I also advised her to check sugars later in the day to see if we need to add this before dinner, also. -At last visit we could not check an HbA1c since this  was a virtual appointment. -At this visit, sugars are much worse, many of them in the 200s after coming off insulin and GLP-1 receptor agonist for the last 3 to 4 months.  She also lost weight, which could be related to her improving her diet by cutting out concentrated sweets and processed foods, but it could also be related to deteriorating diabetes control.  At today's visit, since she tells me that she now has another insurance which covers Trulicity, we will go ahead and restart the 3 mg of Trulicity weekly.  We will also start a lower dose Toujeo, 20 units in the morning and I advised her how to titrate this based on morning sugars.  We will continue the Metformin, glipizide, Jardiance for now. - I suggested to:  Patient Instructions  Please continue: - Metformin 1000 mg twice a day with meals - Glipizide 5 mg 15-30 min before breakfast - Jardiance 25 mg before breakfast  Please restart: - Trulicity 3 mg weekly - Toujeo 20 units at bedtime (you can titrate the dose up by 4 units every 4 days until sugars in am <140  Please return in 3 months with your sugar log.   - we checked her HbA1c: 8.9% (higher) - advised to check sugars at different times of the day - 1-2x a day, rotating check times - advised for yearly eye exams >> she is UTD - return to clinic in 3 months      2. Obesity class 3 -continue SGLT 2 inhibitor and GLP-1 receptor agonist which should also help with weight loss -She had great success on a plant-based diet in the past but could not continue it.  We discussed at last visit about switching to the whole 30 diet or Weight watchers. She cut down on refined sugars and refined foods.  - she lost 14 lbs since 04/2019  3. HL -Reviewed latest lipid panel from 04/2019: LDL slightly above goal, triglycerides also slightly high, HDL at goal: Lab Results  Component Value Date   CHOL 190 05/01/2019   HDL 57 05/01/2019   LDLCALC 105 (H) 05/01/2019   LDLDIRECT 140.1 10/06/2011    TRIG 163 (H) 05/01/2019   CHOLHDL 3.3 05/01/2019  -Continues Lipitor 40 without side effects  Philemon Kingdom, MD PhD [facility] End[facility]  She continues

## 2020-03-06 ENCOUNTER — Telehealth: Payer: Self-pay

## 2020-03-06 MED ORDER — EMPAGLIFLOZIN 25 MG PO TABS
25.0000 mg | ORAL_TABLET | Freq: Every day | ORAL | 3 refills | Status: DC
Start: 2020-03-06 — End: 2020-03-11

## 2020-03-06 NOTE — Telephone Encounter (Signed)
  Request Reference Number: [id number]. JARDIANCE TAB 25MG  is approved through 03/06/2021. Your patient may now fill this prescription and it will be covered. Drug Jardiance 25MG  tablets Form [facility] Electronic Prior Authorization Form [phone] NCPDP)

## 2020-03-11 MED ORDER — EMPAGLIFLOZIN 25 MG PO TABS
25.0000 mg | ORAL_TABLET | Freq: Every day | ORAL | 3 refills | Status: DC
Start: 2020-03-11 — End: 2020-03-13

## 2020-03-11 NOTE — Telephone Encounter (Signed)
RX sent to [facility].

## 2020-03-11 NOTE — Addendum Note (Signed)
Addended by: Cardell Peach I on: 03/11/2020 01:20 PM   Modules accepted: Orders

## 2020-03-11 NOTE — Telephone Encounter (Signed)
Patient's Jardiance needs to be sent to [facility]

## 2020-03-12 NOTE — Telephone Encounter (Signed)
Patient called stating [facility] won't approve the jardiance until we cancel the prescription that was sent to [facility]? Patient also asks if we could also make sure its a 90 day supply for insurance purposes.

## 2020-03-13 MED ORDER — EMPAGLIFLOZIN 25 MG PO TABS
25.0000 mg | ORAL_TABLET | Freq: Every day | ORAL | 3 refills | Status: DC
Start: 2020-03-13 — End: 2020-12-17

## 2020-03-13 NOTE — Telephone Encounter (Signed)
90 day sent to [facility]. Patient will need to contact [facility] to cancel.

## 2020-03-13 NOTE — Addendum Note (Signed)
Addended by: Cardell Peach I on: 03/13/2020 08:41 AM   Modules accepted: Orders

## 2020-05-17 ENCOUNTER — Ambulatory Visit: Payer: 59 | Admitting: Internal Medicine

## 2020-08-01 ENCOUNTER — Other Ambulatory Visit: Payer: Self-pay | Admitting: Internal Medicine

## 2020-08-13 ENCOUNTER — Ambulatory Visit: Payer: 59 | Admitting: Internal Medicine

## 2020-08-16 ENCOUNTER — Encounter: Payer: Self-pay | Admitting: Internal Medicine

## 2020-08-16 ENCOUNTER — Other Ambulatory Visit: Payer: Self-pay

## 2020-08-16 ENCOUNTER — Ambulatory Visit: Payer: 59 | Admitting: Internal Medicine

## 2020-08-16 VITALS — BP 120/82 | HR 109 | Ht 67.0 in | Wt 259.4 lb

## 2020-08-16 DIAGNOSIS — Z794 Long term (current) use of insulin: Secondary | ICD-10-CM

## 2020-08-16 DIAGNOSIS — E785 Hyperlipidemia, unspecified: Secondary | ICD-10-CM

## 2020-08-16 DIAGNOSIS — E113291 Type 2 diabetes mellitus with mild nonproliferative diabetic retinopathy without macular edema, right eye: Secondary | ICD-10-CM

## 2020-08-16 LAB — POCT GLYCOSYLATED HEMOGLOBIN (HGB A1C): Hemoglobin A1C: 7.8 % — AB (ref 4.0–5.6)

## 2020-08-16 MED ORDER — TOUJEO MAX SOLOSTAR 300 UNIT/ML ~~LOC~~ SOPN: 36.0000 [IU] | PEN_INJECTOR | Freq: Every day | SUBCUTANEOUS | 3 refills | Status: DC

## 2020-08-16 NOTE — Progress Notes (Signed)
Patient ID: Sarah Walls, female   DOB: Oct 16, 1960, 60 y.o.   MRN: [id number]   This visit occurred during the SARS-CoV-2 public health emergency.  Safety protocols were in place, including screening questions prior to the visit, additional usage of staff PPE, and extensive cleaning of exam room while observing appropriate contact time as indicated for disinfecting solutions.   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 Last visit 7 months ago.  Reviewed HbA1c levels: Lab Results  Component Value Date   HGBA1C 8.9 (A) 02/13/2020   HGBA1C 8.8 (A) 03/21/2019   HGBA1C 10.1 (A) 08/10/2018   HGBA1C 8.1 (A) 03/04/2018   HGBA1C 8.3 10/29/2017   HGBA1C 9.4 07/23/2017   HGBA1C 7.6 02/12/2017   HGBA1C 8.8 11/27/2016   HGBA1C 9.2 (H) 09/15/2016   HGBA1C 8.5 (H) 04/27/2016   She is on: - Metformin 2000 mg with dinner >> ER 2000 mg with dinner - Glipizide 5 mg before breakfast-added 07/2019 - Jardiance 25 mg before breakfast - Trulicity 1.5 >> 3 mg weekly in a.m. >> restarted 01/2020 - Tresiba >> Levemir 70 units in at bedtime >>  40 units 2x a day >> 60 units in a.m. as she was forgetting the second dose of the day >> Toujeo 60 units in a.m.>> restarted 20-40 units in a.m. 01/2020 Unfortunately, 3 weeks before last visit, she came off Jardiance as she could not refill it We stopped Januvia when we started Trulicity.  Pt checks her sugars 1-2 times a day - am:  180-210 >> 156-197 >> 113-150 >> 146-226 >> 80, 93-166 - 2h after b'fast: 202-238 >> 210-230 >> 174--240 >>  - before lunch: 213 > 204-211 >> n/c >> 174 >> n/c - 2h after lunch: n/c >> 226-248, 328 >> n/c >> 212 >> n/c - before dinner: n/c >> 189 >> 195-215 >> 179 >> n/c  - 2h after dinner:  220 >> n/c >> 203 >> n/c >> 225 >> 209, 218 - bedtime:190-220, 270 >> n/c >> 128-204 >> 190-270 >> 150-189 - nighttime:  n/c Lowest sugar was 156 >> 156 >> 80; she has hypoglycemia awareness in the 70s. Highest sugar was 238 >> 270 >> 218  Glucometer: AccuChek  Pt's meals are: - Breakfast: b'fast sandwich + yoghurt; boiled eggs + slice of bacon + yoghurt - Lunch: frozen meal +/- salad - Dinner: frozen meals - Snacks: zucchini bread, apple  -No CKD, last BUN/creatinine:  Lab Results  Component Value Date   BUN 6 (L) 05/01/2019   BUN 8 03/04/2018   CREATININE 0.51 (L) 05/01/2019   CREATININE 0.58 03/04/2018  On lisinopril. -+ HL; last set of lipids: Lab Results  Component Value Date   CHOL 190 05/01/2019   HDL 57 05/01/2019   LDLCALC 105 (H) 05/01/2019   LDLDIRECT 140.1 10/06/2011   TRIG 163 (H) 05/01/2019   CHOLHDL 3.3 05/01/2019  On Lipitor 40. - last eye exam was in 07/2020: No DR, previously + mild DR; she has a history of cataract surgery in 2016. - no numbness and tingling in her feet.  ROS: Constitutional: no weight gain/no weight loss, no fatigue, no subjective hyperthermia, no subjective hypothermia Eyes: no blurry vision, no xerophthalmia ENT: no sore throat, no nodules palpated in neck, no dysphagia, no odynophagia, no hoarseness Cardiovascular: no CP/no SOB/no palpitations/no leg swelling Respiratory: no cough/no SOB/no wheezing Gastrointestinal: no N/no V/no D/no C/no acid reflux  Musculoskeletal: no muscle aches/no joint aches Skin: no rashes, no hair loss Neurological: no tremors/no numbness/no tingling/no dizziness  I reviewed pt's medications, allergies, PMH, social hx, family hx, and changes were documented in the history of present illness. Otherwise, unchanged from my initial visit note.  Past Medical History:  Diagnosis Date  . Allergy   . Arthritis   . Bipolar disorder ([place])   . Cataract   . Depression    BAD2 - Dr. Albertine Patricia  . Diabetes mellitus without complication ([place])   . Hyperlipidemia   . Hypertension   . Panic attacks    BAD2 - Dr. Albertine Patricia  . Personal  history of colonic polyps - adenomas 10/13/2013   12/2013 - 6 adenomas max 12 mm - repeat colonoscopy  2018   . Tuberculosis    positive PPD   Past Surgical History:  Procedure Laterality Date  . CATARACT EXTRACTION Bilateral 2015  . CESAREAN SECTION  1990   Fetal Distress  . COLONOSCOPY    . EYE SURGERY    . POLYPECTOMY    . TONSILLECTOMY  1969   Social History   Social History  . Marital status: Married    Spouse name: N/A  . Number of children: 1   Occupational History  . 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    Social History Main Topics  . Smoking status: Never Smoker  . Smokeless tobacco: Never Used  . Alcohol use No  . Drug use: No   Social History Narrative   05/2009 Masters in counseling in disabled.   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       Mother lived in home.  (Deceased quickly Spring 09 from metastatic melanoma)   Current Outpatient Medications on File Prior to Visit  Medication Sig Dispense Refill  . ACCU-CHEK FASTCLIX LANCETS MISC Check blood 1 - 2 times daily and as directed. Dx E11.65 200 each 1  . ACCU-CHEK GUIDE test strip CHECK BLOOD 1 - 2 TIMES DAILY AND AS DIRECTED. DX E11.65 200 strip 1  . ALPRAZolam (XANAX) 0.5 MG tablet Take 0.5 mg by mouth at bedtime as needed (for panic attacks.). May take up to 4 tablets per day as needed    . aspirin 81 MG tablet Take 81 mg by mouth daily.    Marland Kitchen atorvastatin (LIPITOR) 80 MG tablet Take 1 tablet (80 mg total) by mouth daily at 6 PM. 90 tablet 3  . chlorhexidine (PERIDEX) 0.12 % solution   3  . desvenlafaxine (PRISTIQ) 100 MG 24 hr tablet Take 100 mg by mouth daily.    . Dulaglutide (TRULICITY) 3 KK/9.3GH SOPN Inject 0.5 mLs (3 mg total) into the skin once a week. 2 mL 11  . empagliflozin (JARDIANCE) 25 MG TABS tablet Take 1 tablet (25 mg total) by mouth daily. 90 tablet 3  . glipiZIDE (GLUCOTROL) 5 MG tablet TAKE 1 TABLET (5 MG TOTAL) BY  MOUTH DAILY BEFORE BREAKFAST. 30 tablet 1  . insulin glargine, 2 Unit Dial, (TOUJEO MAX SOLOSTAR) 300 UNIT/ML Solostar Pen Inject 20-40 Units into the skin daily. 18 mL 3  . Insulin Pen Needle (B-D UF III MINI PEN NEEDLES) 31G X 5 MM MISC USE DAILY WITH INSULIN PEN 100 each 3  . lamoTRIgine (LAMICTAL) 150 MG tablet Take 150 mg by mouth daily.    Marland Kitchen lamoTRIgine (LAMICTAL) 25 MG tablet Take 25 mg by mouth as needed.    Marland Kitchen lisinopril (ZESTRIL) 10 MG  tablet Take 1 tablet (10 mg total) by mouth at bedtime. TAKE 1 TABLET BY MOUTH ONCE DAILY (NEED ANNUAL PHYSICAL FOR FUTHER REFILLS) 90 tablet 3  . metFORMIN (GLUCOPHAGE-XR) 500 MG 24 hr tablet Take 2 tablets (1,000 mg total) by mouth 2 (two) times daily with a meal. 360 tablet 3  . metoprolol tartrate (LOPRESSOR) 25 MG tablet TAKE 0.5 TABLETS BY MOUTH DAILY. 45 tablet 1  . Multiple Vitamin (MULTIVITAMIN) tablet Take 1 tablet by mouth daily.      . ondansetron (ZOFRAN) 4 MG tablet Take 1 tablet (4 mg total) by mouth every 8 (eight) hours as needed for nausea or vomiting. 30 tablet 1  . terbinafine (LAMISIL) 1 % cream Apply 1 application topically 2 (two) times daily. 30 g 1   No current facility-administered medications on file prior to visit.   Allergies  Allergen Reactions  . Beta Adrenergic Blockers Other (See Comments)    Drowsy, fatigue   Family History  Problem Relation Age of Onset  . Cancer Mother        Metastatic liver CA  . Thyroid disease Mother        Hypothyroidism  . Vision loss Mother        Eye tumor, removed orbit at the end of 2004  . Melanoma Mother   . Diabetes Mother   . Diabetes Father   . Heart disease Father        CABG, coronary disease  . Thyroid disease Sister        Hypothyroidism  . Colon cancer Maternal Aunt 83  . Breast cancer Neg Hx   . Colon polyps Neg Hx   . Esophageal cancer Neg Hx   . Rectal cancer Neg Hx   . Stomach cancer Neg Hx    PE: BP 120/82 (BP Location: Right Arm, Patient Position: Sitting,  Cuff Size: Normal)   Pulse (!) 109   Ht 5\' 7"  (1.702 m)   Wt 259 lb 6.4 oz (117.7 kg)   LMP 08/28/2015 (Approximate)   SpO2 96%   BMI 40.63 kg/m  Wt Readings from Last 3 Encounters:  08/16/20 259 lb 6.4 oz (117.7 kg)  02/13/20 254 lb (115.2 kg)  10/04/19 261 lb 9.6 oz (118.7 kg)   Constitutional: overweight, in NAD Eyes: PERRLA, EOMI, no exophthalmos ENT: moist mucous membranes, no thyromegaly, no cervical lymphadenopathy Cardiovascular: Tachycardia, RR, No MRG Respiratory: CTA B Gastrointestinal: abdomen soft, NT, ND, BS+ Musculoskeletal: no deformities, strength intact in all 4 Skin: moist, warm, no rashes Neurological: no tremor with outstretched hands, DTR normal in all 4  ASSESSMENT: 1. 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 Obesity class 3  3. HL  PLAN:  1. Patient with longstanding, uncontrolled, type 2 diabetes, on oral antidiabetic regimen with Metformin, sulfonylurea, and SGLT2 inhibitor, to which way adding back a GLP-1 receptor agonist and restarting the lower dose long-acting insulin at last visit.  She was then lost for follow-up for 7 months.  She is not usually compliant with the recommended visit intervals. -At last visit, sugars were much worse, mainly in the 200s after coming off GLP-1 receptor agonist.  HbA1c was higher, at 8.9%.  At that time, we also discussed about improving diet. In the past, she had problems with nausea/vomiting/vertigo as a part of Mnire's disease and actually got FMLA from work for this.  She was trying to adjust her diet but she also had IBS and problems tolerating some of the foods and  could not restart the plant-based diet due to the above problems. 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  Indeed, sugars at bedtime may be in the 200s.  We discussed about  trying to limit snacking at night.  Also, she tells me that she is varying the dose of Toujeo between 20 to 40 units daily.  I advised him that this is a large fluctuation and advised him to keep the dose of around 36 to 40 units. -Otherwise, I do not believe we need to change her regimen for now. - I suggested to:  Patient Instructions  Please continue: - Metformin 2000 mg with dinner - Glipizide 5 mg 15-30 min before breakfast - Jardiance 25 mg before breakfast - Trulicity 3 mg weekly  Keep: - Toujeo 36-40 units at bedtime  Please return in 3 months with your sugar log.   - we checked her HbA1c: 7.8% (lower) - advised to check sugars at different times of the day - 1-2x a day, rotating check times - advised for yearly eye exams >> she is UTD - she has an appointment with PCP coming up will have labs then - return to clinic in 3 months     2. Obesity class 3 -She had great success on the plant-based diet in the past but could not continue it.  Before last visit, she cut down refined sugars and refined foods.  We discussed about the whole 30 diet or weight watchers. -continue SGLT 2 inhibitor and GLP-1 receptor agonist which should also help with weight loss -Gained 5 pounds since last visit  3. HL -Reviewed latest lipid panel from 04/2019: LDL above target, triglycerides slightly high, HDL at goal: Lab Results  Component Value Date   CHOL 190 05/01/2019   HDL 57 05/01/2019   LDLCALC 105 (H) 05/01/2019   LDLDIRECT 140.1 10/06/2011   TRIG 163 (H) 05/01/2019   CHOLHDL 3.3 05/01/2019  -Continues on Lipitor 40 mg daily without side effects -New lipid panel pending next week with PCP  Philemon Kingdom, MD PhD [facility] Endocrinology

## 2020-08-16 NOTE — Patient Instructions (Addendum)
Please continue: - Metformin 2000 mg with dinner - Glipizide 5 mg 15-30 min before breakfast - Jardiance 25 mg before breakfast - Trulicity 3 mg weekly  Keep: - Toujeo 36-40 units at bedtime  Please return in 3 months with your sugar log.

## 2020-08-23 ENCOUNTER — Ambulatory Visit (INDEPENDENT_AMBULATORY_CARE_PROVIDER_SITE_OTHER): Payer: 59 | Admitting: Family Medicine

## 2020-08-23 ENCOUNTER — Other Ambulatory Visit: Payer: Self-pay

## 2020-08-23 ENCOUNTER — Encounter: Payer: Self-pay | Admitting: Family Medicine

## 2020-08-23 ENCOUNTER — Ambulatory Visit (HOSPITAL_COMMUNITY)
Admission: RE | Admit: 2020-08-23 | Discharge: 2020-08-23 | Disposition: A | Discharge status: Home / Self Care | Payer: 59 | Source: Ambulatory Visit | Attending: Family Medicine | Admitting: Family Medicine

## 2020-08-23 VITALS — BP 128/82 | HR 120 | Wt 261.0 lb

## 2020-08-23 DIAGNOSIS — R7989 Other specified abnormal findings of blood chemistry: Secondary | ICD-10-CM | POA: Diagnosis not present

## 2020-08-23 DIAGNOSIS — Z0000 Encounter for general adult medical examination without abnormal findings: Secondary | ICD-10-CM | POA: Diagnosis not present

## 2020-08-23 DIAGNOSIS — I10 Essential (primary) hypertension: Secondary | ICD-10-CM

## 2020-08-23 DIAGNOSIS — R000 Tachycardia, unspecified: Secondary | ICD-10-CM | POA: Diagnosis not present

## 2020-08-23 DIAGNOSIS — E785 Hyperlipidemia, unspecified: Secondary | ICD-10-CM | POA: Diagnosis not present

## 2020-08-23 DIAGNOSIS — L304 Erythema intertrigo: Secondary | ICD-10-CM

## 2020-08-23 DIAGNOSIS — R42 Dizziness and giddiness: Secondary | ICD-10-CM

## 2020-08-23 MED ORDER — ONDANSETRON 4 MG PO TBDP: 4.0000 mg | ORAL_TABLET | Freq: Three times a day (TID) | ORAL | 0 refills | Status: DC | PRN

## 2020-08-23 NOTE — Addendum Note (Signed)
Addended by: Patriciaann Clan on: 08/23/2020 11:40 AM   Modules accepted: Orders

## 2020-08-23 NOTE — Patient Instructions (Signed)
For your skin:  Try lamisil cream (terbenafine)  - twice a day until all gone (about 2 weeks or so, use for about 2 days after full resolution).   Use the cortisone 10 alternating every 6 hours as needed for the itch in between the lamisil use.   Keep area as dry as possible.   Follow up for your pap smear (and STD screening if you would like), in the next few months.

## 2020-08-23 NOTE — Assessment & Plan Note (Signed)
Well-controlled on lisinopril daily.  Checking CMP today.

## 2020-08-23 NOTE — Assessment & Plan Note (Addendum)
Chronic and asymptomatic.  Sinus again today on EKG.  Will check TSH and CBC to ensure no dysfunction or anemia contributing.  Previously trialed on metoprolol, however she could not tolerate due to fatigue. Will also schedule echo to assess for any resulting cardiomyopathy given longstanding tachycardia, some atrial enlargement/LVH seen on EKG.  May need to consider alternative agent vs cardiology referral in the future.

## 2020-08-23 NOTE — Assessment & Plan Note (Addendum)
Recurrent, underneath breast and groin.  Recommended terbinafine cream with hydrocortisone as needed for itch as this was effective for her in the past.  Discussed keeping area as dry as possible to prevent recurrence (already working with endocrinology for diabetic control).

## 2020-08-23 NOTE — Assessment & Plan Note (Signed)
PHQ score of 5, reviewed and discussed, following up with psychiatrist in a few weeks. BP reviewed and normal. Will postpone STD screening for with Pap smear in the next few months (due 11/2020). Obtain lipid panel, on atorvastatin. Given [place] contact information to schedule colonoscopy, next was due in 06/2020.  UTD on COVID vaccinations and mammogram screening.

## 2020-08-23 NOTE — Progress Notes (Addendum)
    SUBJECTIVE:   Chief compliant/HPI: annual examination  Sarah Walls is a 60 y.o. who presents today for an annual exam and discuss the following:   Rash: Present off and on underneath bilateral breasts and groin.  Itchy.  Same rash she was seen for in 09/2019, terbinafine and cortisone effective with complete resolution at that time.  She has been using Desitin to help.  Wants to know if there is a way she can prevent it.   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   Review of systems form reviewed and notable for denies any unexpected weight loss, abdominal pain, chest pain, dyspnea on exertion.   [place] Office Visit from 08/23/2020 in [place]  Thoughts that you would be better off dead, or of hurting yourself in some way Not at all  PHQ-9 Total Score 5      OBJECTIVE:   BP 128/82   Pulse (!) 120   Wt 261 lb (118.4 kg)   LMP 08/28/2015 (Approximate)   SpO2 94%   BMI 40.88 kg/m   General: Alert, NAD HEENT: NCAT, MMM, nonenlarged thyroid without palpation of nodules Cardiac: Tachycardic with regular rhythm, no murmurs appreciated Lungs: Clear bilaterally, no increased WOB  Abdomen: soft Msk: Moves all extremities spontaneously  Ext: Warm, dry, 2+ distal pulses, no edema  Derm: Coalesced erythematous rash present underneath bilateral breasts with satellite lesions  ASSESSMENT/PLAN:   Annual physical exam PHQ score of 5, reviewed and discussed, following up with psychiatrist in a few weeks. BP reviewed and normal. Will postpone STD screening for with Pap smear in the next few months (due 11/2020). Obtain lipid panel, on atorvastatin. Given [place] contact information to schedule colonoscopy, next was due in 06/2020.  UTD on COVID vaccinations and mammogram  screening.  Tachycardia Chronic and asymptomatic.  Sinus again today on EKG.  Will check TSH and CBC to ensure no dysfunction or anemia contributing.  Previously trialed on metoprolol, however she could not tolerate due to fatigue. Will also schedule echo to assess for any resulting cardiomyopathy given longstanding tachycardia, some atrial enlargement/LVH seen on EKG.  May need to consider alternative agent vs cardiology referral in the future.  Intertrigo Recurrent, underneath breast and groin.  Recommended terbinafine cream with hydrocortisone as needed for itch as this was effective for her in the past.  Discussed keeping area as dry as possible to prevent recurrence (already working with endocrinology for diabetic control).  Essential hypertension Well-controlled on lisinopril daily.  Checking CMP today.  Elevated LFTs AST/ALT 42/50 in 2019.  Will recheck today with CMP.  Could consider RUQ U/S pending results, suspect may be hepatic steatosis.     Follow-up in the next few months for Pap smear (by 11/2020) or sooner if needed.   Patriciaann Clan, [place]

## 2020-08-23 NOTE — Assessment & Plan Note (Signed)
AST/ALT 42/50 in 2019.  Will recheck today with CMP.  Could consider RUQ U/S pending results, suspect may be hepatic steatosis.

## 2020-08-24 LAB — CBC
Hematocrit: 50.9 % — ABNORMAL HIGH (ref 34.0–46.6)
Hemoglobin: 16.5 g/dL — ABNORMAL HIGH (ref 11.1–15.9)
MCH: 30.1 pg (ref 26.6–33.0)
MCHC: 32.4 g/dL (ref 31.5–35.7)
MCV: 93 fL (ref 79–97)
Platelets: 289 10*3/uL (ref 150–450)
RBC: 5.49 x10E6/uL — ABNORMAL HIGH (ref 3.77–5.28)
RDW: 12.4 % (ref 11.7–15.4)
WBC: 10.1 10*3/uL (ref 3.4–10.8)

## 2020-08-24 LAB — COMPREHENSIVE METABOLIC PANEL
ALT: 32 IU/L (ref 0–32)
AST: 22 IU/L (ref 0–40)
Albumin/Globulin Ratio: 1.8 (ref 1.2–2.2)
Albumin: 4.6 g/dL (ref 3.8–4.8)
Alkaline Phosphatase: 65 IU/L (ref 44–121)
BUN/Creatinine Ratio: 14 (ref 12–28)
BUN: 10 mg/dL (ref 8–27)
Bilirubin Total: 0.4 mg/dL (ref 0.0–1.2)
CO2: 21 mmol/L (ref 20–29)
Calcium: 10.1 mg/dL (ref 8.7–10.3)
Chloride: 103 mmol/L (ref 96–106)
Creatinine, Ser: 0.69 mg/dL (ref 0.57–1.00)
Globulin, Total: 2.5 g/dL (ref 1.5–4.5)
Glucose: 174 mg/dL — ABNORMAL HIGH (ref 65–99)
Potassium: 4.4 mmol/L (ref 3.5–5.2)
Sodium: 140 mmol/L (ref 134–144)
Total Protein: 7.1 g/dL (ref 6.0–8.5)
eGFR: 99 mL/min/{1.73_m2} (ref >59)

## 2020-08-24 LAB — LIPID PANEL
Chol/HDL Ratio: 3.1 ratio (ref 0.0–4.4)
Cholesterol, Total: 181 mg/dL (ref 100–199)
HDL: 58 mg/dL (ref >39)
LDL Chol Calc (NIH): 93 mg/dL (ref 0–99)
Triglycerides: 177 mg/dL — ABNORMAL HIGH (ref 0–149)
VLDL Cholesterol Cal: 30 mg/dL (ref 5–40)

## 2020-08-24 LAB — TSH: TSH: 1.09 u[IU]/mL (ref 0.450–4.500)

## 2020-09-16 ENCOUNTER — Other Ambulatory Visit (HOSPITAL_COMMUNITY)
Admission: RE | Admit: 2020-09-16 | Discharge: 2020-09-16 | Disposition: A | Discharge status: Home / Self Care | Payer: 59 | Source: Ambulatory Visit | Attending: Family Medicine | Admitting: Family Medicine

## 2020-09-16 ENCOUNTER — Ambulatory Visit (INDEPENDENT_AMBULATORY_CARE_PROVIDER_SITE_OTHER): Payer: 59 | Admitting: Family Medicine

## 2020-09-16 ENCOUNTER — Encounter: Payer: Self-pay | Admitting: Family Medicine

## 2020-09-16 ENCOUNTER — Other Ambulatory Visit: Payer: Self-pay

## 2020-09-16 ENCOUNTER — Telehealth: Payer: Self-pay

## 2020-09-16 VITALS — BP 138/84 | HR 113 | Ht 67.0 in | Wt 267.2 lb

## 2020-09-16 DIAGNOSIS — Z124 Encounter for screening for malignant neoplasm of cervix: Secondary | ICD-10-CM

## 2020-09-16 DIAGNOSIS — I10 Essential (primary) hypertension: Secondary | ICD-10-CM | POA: Diagnosis not present

## 2020-09-16 DIAGNOSIS — R000 Tachycardia, unspecified: Secondary | ICD-10-CM | POA: Diagnosis not present

## 2020-09-16 MED ORDER — LISINOPRIL 10 MG PO TABS: 10.0000 mg | ORAL_TABLET | Freq: Every day | ORAL | 1 refills | Status: DC

## 2020-09-16 NOTE — Assessment & Plan Note (Signed)
Pap with HPV performed today.  Will follow results.

## 2020-09-16 NOTE — Assessment & Plan Note (Addendum)
Controlled.  Sent in refill of lisinopril 10mg . Cr wnl in 08/2020.

## 2020-09-16 NOTE — Assessment & Plan Note (Signed)
Chronic and asymptomatic.  Will get echocardiogram scheduled.

## 2020-09-16 NOTE — Progress Notes (Signed)
    SUBJECTIVE:   CHIEF COMPLAINT / HPI: Papsmear   Sarah Walls is a 60 year old female presenting for her Pap smear.  Her last was in 2017, normal cytology with negative HPV.  She has not had an abnormal Pap in the past.  She is not currently sexually active, does not desire STD testing.  Denies any vaginal concerns.  She needs a refill of her lisinopril.  Her echocardiogram also needs to be scheduled.  She is returning to [facility] to do ED behavioral health later this month, she is very excited about this.  PERTINENT  PMH / PSH:  Hypertension, type II DM followed closely by endocrinology, hyperlipidemia, bipolar disorder, elevated BMI, vitamin D deficiency, Mnire's disease.   OBJECTIVE:   BP 138/84   Pulse (!) 113   Ht 5\' 7"  (1.702 m)   Wt 267 lb 3.2 oz (121.2 kg)   LMP 08/28/2015 (Approximate)   SpO2 98%   BMI 41.85 kg/m    General: No acute distress, sitting comfortably Pulmonary: No increased work of breathing Pelvic exam: VULVA: normal appearing vulva with no masses, tenderness or lesions, VAGINA: normal appearing vagina with normal color, no lesions, vaginal discharge - white, CERVIX: normal appearing cervix without discharge or lesions, UTERUS: uterus is normal size, shape, consistency and nontender, ADNEXA: normal adnexa in size, nontender and no masses, exam chaperoned by CMA.   ASSESSMENT/PLAN:   Encounter for screening for cervical cancer Pap with HPV performed today.  Will follow results.  Tachycardia Chronic and asymptomatic.  Will get echocardiogram scheduled.  Essential hypertension Controlled.  Sent in refill of lisinopril 10mg . Cr wnl in 08/2020.     Follow-up in approximately 3 months or sooner if needed.  Sarah Walls, [place]

## 2020-09-16 NOTE — Patient Instructions (Signed)
It was wonderful to see you today.  Your Pap smear results should be back in the next several days, up to 1 week.  We will also get your echocardiogram scheduled, if you do not hear back today please call.

## 2020-09-17 LAB — CYTOLOGY - PAP
Comment: NEGATIVE
Diagnosis: NEGATIVE
High risk HPV: NEGATIVE

## 2020-10-01 NOTE — Telephone Encounter (Signed)
Echo scheduled for 5/20 per pt appointment list in Epic. Ottis Stain, CMA

## 2020-10-07 ENCOUNTER — Other Ambulatory Visit (HOSPITAL_COMMUNITY): Payer: Self-pay

## 2020-10-07 MED ORDER — BUPROPION HCL ER (XL) 150 MG PO TB24
150.0000 mg | ORAL_TABLET | Freq: Every morning | ORAL | 0 refills | Status: DC
  Filled 2020-10-07: qty 30, 30d supply, fill #0
  Filled 2020-11-01: qty 90, 90d supply, fill #0

## 2020-10-08 ENCOUNTER — Other Ambulatory Visit: Payer: Self-pay | Admitting: Internal Medicine

## 2020-10-09 ENCOUNTER — Other Ambulatory Visit (HOSPITAL_COMMUNITY): Payer: Self-pay

## 2020-10-17 ENCOUNTER — Other Ambulatory Visit (HOSPITAL_COMMUNITY): Payer: Self-pay

## 2020-10-24 ENCOUNTER — Ambulatory Visit (AMBULATORY_SURGERY_CENTER): Payer: 59 | Admitting: *Deleted

## 2020-10-24 ENCOUNTER — Other Ambulatory Visit: Payer: Self-pay

## 2020-10-24 ENCOUNTER — Other Ambulatory Visit (HOSPITAL_COMMUNITY): Payer: Self-pay

## 2020-10-24 VITALS — Ht 67.0 in | Wt 260.0 lb

## 2020-10-24 DIAGNOSIS — Z86010 Personal history of colonic polyps: Secondary | ICD-10-CM

## 2020-10-24 MED ORDER — NA SULFATE-K SULFATE-MG SULF 17.5-3.13-1.6 GM/177ML PO SOLN
ORAL | 0 refills | Status: DC
  Filled 2020-10-24 – 2020-11-01 (×2): qty 354, 1d supply, fill #0

## 2020-10-24 NOTE — Progress Notes (Signed)
Patient's pre-visit was done today over the phone with the patient due to COVID-19 pandemic. Name,DOB and address verified. Insurance verified. Patient denies any allergies to Eggs and Soy. Patient denies any problems with anesthesia/sedation. Patient denies taking diet pills or blood thinners. Packet of Prep instructions mailed to patient including a copy of a consent form-pt is aware. Patient understands to call us back with any questions or concerns. Patient is aware of our care-partner policy and [id number] safety protocol. EMMI education assigned to the patient for the procedure, sent to [place]. The patient is COVID-19 vaccinated, per patient.

## 2020-11-01 ENCOUNTER — Other Ambulatory Visit: Payer: Self-pay

## 2020-11-01 ENCOUNTER — Ambulatory Visit (HOSPITAL_COMMUNITY): Payer: 59 | Attending: Internal Medicine

## 2020-11-01 ENCOUNTER — Other Ambulatory Visit (HOSPITAL_COMMUNITY): Payer: Self-pay

## 2020-11-01 DIAGNOSIS — R000 Tachycardia, unspecified: Secondary | ICD-10-CM | POA: Diagnosis not present

## 2020-11-01 LAB — ECHOCARDIOGRAM COMPLETE
Area-P 1/2: 4.97 cm2
S' Lateral: 3.6 cm

## 2020-11-01 MED ORDER — EMPAGLIFLOZIN 25 MG PO TABS
25.0000 mg | ORAL_TABLET | Freq: Every day | ORAL | 1 refills | Status: DC
  Filled 2020-11-01: qty 90, 90d supply, fill #0

## 2020-11-02 ENCOUNTER — Other Ambulatory Visit (HOSPITAL_COMMUNITY): Payer: Self-pay

## 2020-11-04 ENCOUNTER — Encounter: Payer: Self-pay | Admitting: Internal Medicine

## 2020-11-06 ENCOUNTER — Other Ambulatory Visit: Payer: Self-pay | Admitting: Internal Medicine

## 2020-11-06 ENCOUNTER — Other Ambulatory Visit (HOSPITAL_COMMUNITY): Payer: Self-pay

## 2020-11-06 ENCOUNTER — Other Ambulatory Visit: Payer: Self-pay | Admitting: Family Medicine

## 2020-11-06 DIAGNOSIS — E785 Hyperlipidemia, unspecified: Secondary | ICD-10-CM

## 2020-11-07 ENCOUNTER — Other Ambulatory Visit (HOSPITAL_COMMUNITY): Payer: Self-pay

## 2020-11-07 ENCOUNTER — Telehealth: Payer: Self-pay | Admitting: Internal Medicine

## 2020-11-07 ENCOUNTER — Other Ambulatory Visit: Payer: Self-pay | Admitting: Family Medicine

## 2020-11-07 DIAGNOSIS — E785 Hyperlipidemia, unspecified: Secondary | ICD-10-CM

## 2020-11-07 MED ORDER — ATORVASTATIN CALCIUM 80 MG PO TABS
80.0000 mg | ORAL_TABLET | Freq: Every day | ORAL | 3 refills | Status: DC
Start: 2020-11-07 — End: 2021-12-26
  Filled 2020-11-07: qty 90, 90d supply, fill #0
  Filled 2021-03-07: qty 90, 90d supply, fill #1
  Filled 2021-08-11: qty 90, 90d supply, fill #2

## 2020-11-07 NOTE — Telephone Encounter (Signed)
Pt is in need of a refill for   Dulaglutide (TRULICITY) 3 EX/9.3ZJ Sarah Walls

## 2020-11-08 ENCOUNTER — Encounter: Payer: Self-pay | Admitting: Internal Medicine

## 2020-11-08 ENCOUNTER — Ambulatory Visit (AMBULATORY_SURGERY_CENTER): Payer: 59 | Admitting: Internal Medicine

## 2020-11-08 ENCOUNTER — Other Ambulatory Visit (HOSPITAL_COMMUNITY): Payer: Self-pay

## 2020-11-08 ENCOUNTER — Other Ambulatory Visit: Payer: Self-pay

## 2020-11-08 VITALS — BP 115/69 | HR 104 | Temp 97.8°F | Resp 16 | Ht 67.0 in | Wt 260.0 lb

## 2020-11-08 DIAGNOSIS — D122 Benign neoplasm of ascending colon: Secondary | ICD-10-CM

## 2020-11-08 DIAGNOSIS — D123 Benign neoplasm of transverse colon: Secondary | ICD-10-CM | POA: Diagnosis not present

## 2020-11-08 DIAGNOSIS — Z86010 Personal history of colonic polyps: Secondary | ICD-10-CM | POA: Diagnosis not present

## 2020-11-08 MED ORDER — SODIUM CHLORIDE 0.9 % IV SOLN: 500.0000 mL | Freq: Once | INTRAVENOUS | Status: DC

## 2020-11-08 MED ORDER — TRULICITY 3 MG/0.5ML ~~LOC~~ SOAJ
3.0000 mg | SUBCUTANEOUS | 5 refills | Status: DC
  Filled 2020-11-08: qty 6, 84d supply, fill #0
  Filled 2021-01-31: qty 6, 84d supply, fill #1

## 2020-11-08 NOTE — Patient Instructions (Addendum)
I found and removed 3 tiny polyps today.  I will let you know pathology results and when to have another routine colonoscopy by mail and/or My Chart.  Likely 5 years.  I appreciate the opportunity to care for you. Gatha Mayer, MD, [facility]  Handout given for polyps.  YOU HAD AN ENDOSCOPIC PROCEDURE TODAY AT [facility]:   Refer to the procedure report that was given to you for any specific questions about what was found during the examination.  If the procedure report does not answer your questions, please call your gastroenterologist to clarify.  If you requested that your care partner not be given the details of your procedure findings, then the procedure report has been included in a sealed envelope for you to review at your convenience later.  YOU SHOULD EXPECT: Some feelings of bloating in the abdomen. Passage of more gas than usual.  Walking can help get rid of the air that was put into your GI tract during the procedure and reduce the bloating. If you had a lower endoscopy (such as a colonoscopy or flexible sigmoidoscopy) you may notice spotting of blood in your stool or on the toilet paper. If you underwent a bowel prep for your procedure, you may not have a normal bowel movement for a few days.  Please Note:  You might notice some irritation and congestion in your nose or some drainage.  This is from the oxygen used during your procedure.  There is no need for concern and it should clear up in a day or so.  SYMPTOMS TO REPORT IMMEDIATELY:   Following lower endoscopy (colonoscopy or flexible sigmoidoscopy):  Excessive amounts of blood in the stool  Significant tenderness or worsening of abdominal pains  Swelling of the abdomen that is new, acute  Fever of 100F or higher For urgent or emergent issues, a gastroenterologist can be reached at any hour by calling [phone].  Do not use MyChart messaging for urgent concerns.    DIET:  We do recommend a small  meal at first, but then you may proceed to your regular diet.  Drink plenty of fluids but you should avoid alcoholic beverages for 24 hours.  ACTIVITY:  You should plan to take it easy for the rest of today and you should NOT DRIVE or use heavy machinery until tomorrow (because of the sedation medicines used during the test).    FOLLOW UP: Our staff will call the number listed on your records 48-72 hours following your procedure to check on you and address any questions or concerns that you may have regarding the information given to you following your procedure. If we do not reach you, we will leave a message.  We will attempt to reach you two times.  During this call, we will ask if you have developed any symptoms of COVID 19. If you develop any symptoms (ie: fever, flu-like symptoms, shortness of breath, cough etc.) before then, please call [phone].  If you test positive for Covid 19 in the 2 weeks post procedure, please call and report this information to [place].    If any biopsies were taken you will be contacted by phone or by letter within the next 1-3 weeks.  Please call us at [phone] if you have not heard about the biopsies in 3 weeks.    SIGNATURES/CONFIDENTIALITY: You and/or your care partner have signed paperwork which will be entered into your electronic medical record.  These signatures attest to the  fact that that the information above on your After Visit Summary has been reviewed and is understood.  Full responsibility of the confidentiality of this discharge information lies with you and/or your care-partner.

## 2020-11-08 NOTE — Progress Notes (Signed)
A/ox3, pleased with MAC, report to RN 

## 2020-11-08 NOTE — Progress Notes (Signed)
Called to room to assist during endoscopic procedure.  Patient ID and intended procedure confirmed with present staff. Received instructions for my participation in the procedure from the performing physician.  

## 2020-11-08 NOTE — Telephone Encounter (Signed)
Refill sent.

## 2020-11-08 NOTE — Progress Notes (Signed)
VS by [place]  Pt's states no medical or surgical changes since previsit or office visit.  

## 2020-11-08 NOTE — Op Note (Addendum)
[facility] Patient Name: Sarah Walls Procedure Date: 11/08/2020 3:55 PM MRN: [id number] Endoscopist: Gatha Mayer , MD Age: 60 Referring MD:  Date of Birth: [birth year] Gender: Female Account #: [id number] Procedure:                Colonoscopy Indications:              Surveillance: Personal history of adenomatous                            polyps on last colonoscopy 3 years ago Medicines:                Propofol per Anesthesia, Monitored Anesthesia Care Procedure:                Pre-Anesthesia Assessment:                           - Prior to the procedure, a History and Physical                            was performed, and patient medications and                            allergies were reviewed. The patient's tolerance of                            previous anesthesia was also reviewed. The risks                            and benefits of the procedure and the sedation                            options and risks were discussed with the patient.                            All questions were answered, and informed consent                            was obtained. Prior Anticoagulants: The patient has                            taken no previous anticoagulant or antiplatelet                            agents. ASA Grade Assessment: III - A patient with                            severe systemic disease. After reviewing the risks                            and benefits, the patient was deemed in                            satisfactory condition to undergo the procedure.  After obtaining informed consent, the colonoscope                            was passed under direct vision. Throughout the                            procedure, the patient's blood pressure, pulse, and                            oxygen saturations were monitored continuously. The                            Olympus PCF-H190DL ([id number]) Colonoscope was                             introduced through the anus and advanced to the the                            cecum, identified by appendiceal orifice and                            ileocecal valve. The colonoscopy was performed with                            moderate difficulty due to a redundant colon and                            significant looping. Successful completion of the                            procedure was aided by using manual pressure,                            withdrawing the scope and replacing with the adult                            colonoscope and straightening and shortening the                            scope to obtain bowel loop reduction. The patient                            tolerated the procedure well. The quality of the                            bowel preparation was adequate. The bowel                            preparation used was SUPREP via split dose                            instruction. Scope In: 4:19:57 PM Scope Out: 4:34:43 PM Scope Withdrawal Time: 0 hours 10 minutes 21 seconds  Total Procedure Duration: 0  hours 14 minutes 46 seconds  Findings:                 The perianal and digital rectal examinations were                            normal.                           Three sessile polyps were found in the transverse                            colon and ascending colon. The polyps were                            diminutive in size. These polyps were removed with                            a cold snare. Resection and retrieval were                            complete. Verification of patient identification                            for the specimen was done. Estimated blood loss was                            minimal.                           The exam was otherwise without abnormality on                            direct and retroflexion views. Complications:            No immediate complications. Estimated Blood Loss:     Estimated blood loss was  minimal. Impression:               - Three diminutive polyps in the transverse colon                            and in the ascending colon, removed with a cold                            snare. Resected and retrieved.                           - The examination was otherwise normal on direct                            and retroflexion views.                           - Personal history of colonic polyps. 12/2013 - 6                            adenomas  max 12 mm -                           06/2017 5 sub cm adenomas - Recommendation:           - Patient has a contact number available for                            emergencies. The signs and symptoms of potential                            delayed complications were discussed with the                            patient. Return to normal activities tomorrow.                            Written discharge instructions were provided to the                            patient.                           - Resume previous diet.                           - Continue present medications.                           - Repeat colonoscopy is recommended for                            surveillance. The colonoscopy date will be                            determined after pathology results from today's                            exam become available for review. Likely 5 yrs                           - Consider XL or XXL abdominal binder next time Gatha Mayer, MD 11/08/2020 4:43:52 PM This report has been signed electronically.

## 2020-11-09 ENCOUNTER — Other Ambulatory Visit (HOSPITAL_COMMUNITY): Payer: Self-pay

## 2020-11-12 ENCOUNTER — Telehealth: Payer: Self-pay | Admitting: *Deleted

## 2020-11-12 ENCOUNTER — Other Ambulatory Visit (HOSPITAL_COMMUNITY): Payer: Self-pay

## 2020-11-12 ENCOUNTER — Telehealth: Payer: Self-pay

## 2020-11-12 NOTE — Telephone Encounter (Signed)
  Follow up Call-  Call back number 11/08/2020  Post procedure Call Back phone  # [phone]  Permission to leave phone message Yes  Some recent data might be hidden    LMOM to call back with any questions or concerns.  Also, call back if patient has developed fever, respiratory issues or been dx with COVID or had any family members or close contacts diagnosed since her procedure.'

## 2020-11-12 NOTE — Telephone Encounter (Signed)
  Follow up Call-  Call back number 11/08/2020  Post procedure Call Back phone  # [phone]  Permission to leave phone message Yes  Some recent data might be hidden     1st follow up call made.  NALM

## 2020-11-12 NOTE — Telephone Encounter (Signed)
Inbound call from patient returning call. States everything is fine. No complaints

## 2020-11-15 ENCOUNTER — Other Ambulatory Visit (HOSPITAL_COMMUNITY): Payer: Self-pay

## 2020-11-22 ENCOUNTER — Other Ambulatory Visit (HOSPITAL_COMMUNITY): Payer: Self-pay

## 2020-11-26 ENCOUNTER — Encounter: Payer: Self-pay | Admitting: Internal Medicine

## 2020-11-28 ENCOUNTER — Telehealth: Payer: Self-pay | Admitting: Internal Medicine

## 2020-11-28 NOTE — Telephone Encounter (Signed)
Patient called to advise that her new pharmacy of choice is the [facility].  Patient requests a refill of her Metformin be sent to them for fill

## 2020-11-29 ENCOUNTER — Other Ambulatory Visit (HOSPITAL_COMMUNITY): Payer: Self-pay

## 2020-11-29 MED ORDER — METFORMIN HCL ER 500 MG PO TB24
2000.0000 mg | ORAL_TABLET | Freq: Every day | ORAL | 3 refills | Status: DC
  Filled 2020-11-29: qty 360, 90d supply, fill #0
  Filled 2021-01-31 – 2021-04-23 (×2): qty 360, 90d supply, fill #1

## 2020-11-29 NOTE — Telephone Encounter (Signed)
Refill sent. Pharmacy of choice have already been changed

## 2020-11-30 ENCOUNTER — Other Ambulatory Visit: Payer: Self-pay | Admitting: Internal Medicine

## 2020-11-30 ENCOUNTER — Other Ambulatory Visit (HOSPITAL_COMMUNITY): Payer: Self-pay

## 2020-11-30 MED ORDER — GLIPIZIDE 5 MG PO TABS
5.0000 mg | ORAL_TABLET | Freq: Every day | ORAL | 6 refills | Status: DC
  Filled 2020-11-30: qty 30, 30d supply, fill #0

## 2020-12-10 ENCOUNTER — Other Ambulatory Visit (HOSPITAL_COMMUNITY): Payer: Self-pay

## 2020-12-17 ENCOUNTER — Other Ambulatory Visit (HOSPITAL_COMMUNITY): Payer: Self-pay

## 2020-12-17 ENCOUNTER — Other Ambulatory Visit: Payer: Self-pay

## 2020-12-17 ENCOUNTER — Encounter: Payer: Self-pay | Admitting: Internal Medicine

## 2020-12-17 ENCOUNTER — Other Ambulatory Visit: Payer: Self-pay | Admitting: Internal Medicine

## 2020-12-17 ENCOUNTER — Ambulatory Visit: Payer: 59 | Admitting: Internal Medicine

## 2020-12-17 VITALS — BP 140/90 | HR 108 | Ht 67.0 in | Wt 263.0 lb

## 2020-12-17 DIAGNOSIS — E785 Hyperlipidemia, unspecified: Secondary | ICD-10-CM

## 2020-12-17 DIAGNOSIS — Z794 Long term (current) use of insulin: Secondary | ICD-10-CM | POA: Diagnosis not present

## 2020-12-17 DIAGNOSIS — E113291 Type 2 diabetes mellitus with mild nonproliferative diabetic retinopathy without macular edema, right eye: Secondary | ICD-10-CM

## 2020-12-17 LAB — POCT GLYCOSYLATED HEMOGLOBIN (HGB A1C): Hemoglobin A1C: 7.9 % — AB (ref 4.0–5.6)

## 2020-12-17 MED ORDER — FREESTYLE LIBRE 2 SENSOR MISC
1.0000 | 3 refills | Status: DC
Start: 2020-12-17 — End: 2021-04-02
  Filled 2020-12-17: qty 6, 84d supply, fill #0

## 2020-12-17 MED ORDER — TOUJEO MAX SOLOSTAR 300 UNIT/ML ~~LOC~~ SOPN
40.0000 [IU] | PEN_INJECTOR | Freq: Every day | SUBCUTANEOUS | 3 refills | Status: DC
  Filled 2020-12-17 – 2021-01-31 (×3): qty 9, 67d supply, fill #0
  Filled 2021-04-16 – 2021-04-28 (×2): qty 9, 67d supply, fill #1
  Filled 2021-07-10: qty 9, 67d supply, fill #2
  Filled 2021-10-07: qty 9, 67d supply, fill #3

## 2020-12-17 MED ORDER — GLIPIZIDE 5 MG PO TABS
5.0000 mg | ORAL_TABLET | Freq: Two times a day (BID) | ORAL | 3 refills | Status: DC
  Filled 2020-12-17: qty 180, 90d supply, fill #0
  Filled 2021-08-11: qty 180, 90d supply, fill #1

## 2020-12-17 MED ORDER — FREESTYLE LIBRE 2 READER DEVI
1.0000 | Freq: Every day | 0 refills | Status: DC
  Filled 2020-12-17: qty 1, 90d supply, fill #0

## 2020-12-17 MED ORDER — EMPAGLIFLOZIN 25 MG PO TABS
25.0000 mg | ORAL_TABLET | Freq: Every day | ORAL | 3 refills | Status: DC
  Filled 2020-12-17: qty 90, 90d supply, fill #0
  Filled 2021-04-23: qty 90, 90d supply, fill #1
  Filled 2021-09-02: qty 90, 90d supply, fill #2

## 2020-12-17 NOTE — Progress Notes (Signed)
Patient ID: Sarah Walls, female   DOB: 06/04/1961, 60 y.o.   MRN: [id number]   This visit occurred during the SARS-CoV-2 public health emergency.  Safety protocols were in place, including screening questions prior to the visit, additional usage of staff PPE, and extensive cleaning of exam room while observing appropriate contact time as indicated for disinfecting solutions.   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 Last visit 4 months ago.  Interim history: No increased urination, blurry vision, nausea, chest pain. Now working nights, but will switch to days in 1.5 mo.  Reviewed HbA1c levels: Lab Results  Component Value Date   HGBA1C 7.8 (A) 08/16/2020   HGBA1C 8.9 (A) 02/13/2020   HGBA1C 8.8 (A) 03/21/2019   HGBA1C 10.1 (A) 08/10/2018   HGBA1C 8.1 (A) 03/04/2018   HGBA1C 8.3 10/29/2017   HGBA1C 9.4 07/23/2017   HGBA1C 7.6 02/12/2017   HGBA1C 8.8 11/27/2016   HGBA1C 9.2 (H) 09/15/2016   She is on: - Metformin 2000 mg with dinner >> ER 2000 mg with dinner >>  when wakes up to go to work - Glipizide 5 mg before breakfast-added 07/2019 - Jardiance 25 mg before breakfast - Trulicity 1.5 >> 3 mg weekly in a.m.- restarted 01/2020 - Toujeo 60 units in a.m.>> restarted 20-40 units in a.m. 01/2020 >> 36-40 >> 40 units daily Unfortunately, 3 weeks before last visit, she came off Jardiance as she could not refill it We stopped Januvia when we started Trulicity.  Pt checks her sugars 1-2 times a day - am: 113-150 >> 146-226 >> 80, 93-166 >> 80, 103, 110, 130-160, 200 - 2h after b'fast: 202-238 >> 210-230 >> 174--240 >>  - before lunch: 213 > 204-211 >> n/c >> 174 >> n/c - 2h after lunch: 226-248, 328 >> n/c >> 212 >> n/c - before dinner: n/c >> 189 >> 195-215 >> 179 >> n/c  >> 104, 139 - 2h after dinner:  203 >> n/c >> 225 >> 209, 218 >> 245, 276 - bedtime: n/c >>  128-204 >> 190-270 >> 150-189 >> n/c - nighttime: n/c Lowest sugar was 156 >> 156 >> 80 >> 80; she has hypoglycemia awareness in the 70s. Highest sugar was 238 >> 270 >> 218 >> 276  Glucometer: AccuChek  Pt's meals are: - Breakfast: b'fast sandwich + yoghurt; boiled eggs + slice of bacon + yoghurt - Lunch: frozen meal +/- salad - Dinner: frozen meals - Snacks: zucchini bread, apple  -No CKD, last BUN/creatinine:  Lab Results  Component Value Date   BUN 10 08/23/2020   BUN 6 (L) 05/01/2019   CREATININE 0.69 08/23/2020   CREATININE 0.51 (L) 05/01/2019  On lisinopril.  -+ HL; last set of lipids: Lab Results  Component Value Date   CHOL 181 08/23/2020   HDL 58 08/23/2020   LDLCALC 93 08/23/2020   LDLDIRECT 140.1 10/06/2011   TRIG 177 (H) 08/23/2020   CHOLHDL 3.1 08/23/2020  On Lipitor 40.  - last eye exam was in 07/2020: No DR, previously + mild DR; she has a history of cataract surgery in 2016.  - no numbness and tingling in her feet.  ROS: Constitutional: no weight gain/no weight loss, no fatigue, no subjective hyperthermia, no subjective hypothermia Eyes: no blurry vision, no xerophthalmia ENT: no sore throat, no nodules palpated in neck, no dysphagia, no odynophagia, no hoarseness Cardiovascular: no CP/no SOB/no palpitations/no leg swelling Respiratory:  no cough/no SOB/no wheezing Gastrointestinal: no N/no V/no D/no C/no acid reflux Musculoskeletal: no muscle aches/no joint aches Skin: no rashes, no hair loss Neurological: no tremors/no numbness/no tingling/no dizziness  I reviewed pt's medications, allergies, PMH, social hx, family hx, and changes were documented in the history of present illness. Otherwise, unchanged from my initial visit note.  Past Medical History:  Diagnosis Date   AC joint pain 08/13/2011   Allergy    Arthritis    Back pain 11/20/2010   Bipolar disorder ([place])    Cataract    Depression    BAD2 - Dr. Albertine Patricia   Diabetes mellitus without  complication ([place])    Hyperlipidemia    Hypertension    Panic attacks    BAD2 - Dr. 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    Social History Main Topics   Smoking status: Never Smoker   Smokeless tobacco: Never Used   Alcohol use No   Drug use: No   Social History Narrative   05/2009 Masters in counseling in disabled.   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       Mother lived in home.  (Deceased quickly Spring 09 from metastatic melanoma)   Current Outpatient Medications on File Prior to Visit  Medication Sig Dispense Refill   ACCU-CHEK FASTCLIX LANCETS MISC Check blood 1 - 2 times daily and as directed. Dx E11.65 200 each 1   ACCU-CHEK GUIDE test strip CHECK BLOOD 1 - 2 TIMES DAILY AND AS DIRECTED. DX E11.65 200 strip 1   ALPRAZolam (XANAX) 0.5 MG tablet Take 0.5 mg by mouth at bedtime as needed (for panic attacks.). May take up to 4 tablets per day as needed     aspirin 81 MG tablet Take 81 mg by mouth daily.     atorvastatin (LIPITOR) 80 MG tablet Take 1 tablet (80 mg total) by mouth daily at 6 PM. 90 tablet 3   buPROPion (WELLBUTRIN XL) 150 MG 24 hr tablet Take 1 tablet (150 mg total) by mouth every morning. 90 tablet 0   chlorhexidine (PERIDEX) 0.12 % solution   3   Dulaglutide (TRULICITY) 3 JS/9.7WY SOPN Inject 3 mg into the skin once a week. 2 mL 5   empagliflozin (JARDIANCE) 25  MG TABS tablet Take 1 tablet (25 mg total) by mouth daily. 90 tablet 3   glipiZIDE (GLUCOTROL) 5 MG tablet TAKE 1 TABLET BY MOUTH DAILY BEFORE BREAKFAST. 30 tablet 6   insulin glargine, 2 Unit Dial, (TOUJEO MAX SOLOSTAR) 300 UNIT/ML Solostar Pen Inject 36-40 Units into the skin daily. (Patient taking differently: Inject 40 Units into the skin daily.) 18 mL 3   Insulin Pen Needle (B-D UF III MINI PEN NEEDLES) 31G X 5 MM MISC USE DAILY WITH INSULIN PEN  100 each 3   lisinopril (ZESTRIL) 10 MG tablet Take 1 tablet (10 mg total) by mouth at bedtime. 90 tablet 1   metFORMIN (GLUCOPHAGE-XR) 500 MG 24 hr tablet Take 4 tablets (2,000 mg total) by mouth daily with supper. 360 tablet 3   metoprolol tartrate (LOPRESSOR) 25 MG tablet TAKE 0.5 TABLETS BY MOUTH DAILY. (Patient not taking: Reported on 11/08/2020) 45 tablet 1   Multiple Vitamin (MULTIVITAMIN) tablet Take 1 tablet by mouth daily. (Patient not taking: Reported on 11/08/2020)     ondansetron (ZOFRAN ODT) 4 MG disintegrating tablet Take 1 tablet (4 mg total) by mouth every 8 (eight) hours as needed for nausea or vomiting. 20 tablet 0   terbinafine (LAMISIL) 1 % cream Apply 1 application topically 2 (two) times daily. 30 g 1   No current facility-administered medications on file prior to visit.   Allergies  Allergen Reactions   Beta Adrenergic Blockers Other (See Comments)    Drowsy, fatigue   Family History  Problem Relation Age of Onset   Cancer Mother        Metastatic liver CA   Thyroid disease Mother        Hypothyroidism   Vision loss Mother        Eye tumor, removed orbit at the end of 2004   Melanoma Mother    Diabetes Mother    Diabetes Father    Heart disease Father        CABG, coronary disease   Thyroid disease Sister        Hypothyroidism   Colon cancer Maternal Aunt 42   Breast cancer Neg Hx    Colon polyps Neg Hx    Esophageal cancer Neg Hx    Rectal cancer Neg Hx    Stomach cancer Neg Hx    PE: BP 140/90   Pulse (!)  108   Ht 5\' 7"  (1.702 m)   Wt 263 lb (119.3 kg)   LMP 08/28/2015 (Approximate)   SpO2 97%   BMI 41.19 kg/m  Wt Readings from Last 3 Encounters:  12/17/20 263 lb (119.3 kg)  11/08/20 260 lb (117.9 kg)  10/24/20 260 lb (117.9 kg)   Constitutional: overweight, in NAD Eyes: PERRLA, EOMI, no exophthalmos ENT: moist mucous membranes, no thyromegaly, no cervical lymphadenopathy Cardiovascular: Tachycardia, RR, No MRG Respiratory: CTA B Gastrointestinal: abdomen soft, NT, ND, BS+ Musculoskeletal: no deformities, strength intact in all 4 Skin: moist, warm, no rashes Neurological: no tremor with outstretched hands, DTR normal in all 4  ASSESSMENT: 1. 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 Obesity class 3  3. HL  PLAN:  1. Patient with longstanding, uncontrolled, type 2 diabetes, on oral antidiabetic regimen with metformin, sulfonylurea, SGLT2 inhibitor, and long-acting insulin, to which we added back a GLP-1 receptor agonist.  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  Indeed, sugars at that time were occasionally in the 200s.  We discussed about trying to limit snacking at night.  She was also telling me at that time that she was varying the Toujeo dose between 20 and 40 units and I advised her to keep the dose more stable, at 36 to 40 units.  Otherwise, we did not change her regimen.  HbA1c at that time was 7.8%, lower. -At today's visit, sugars are slightly better in the  morning, improved in the last month.  They are still above target, though.  Her sugars after dinner are also high, and we discussed about adding another glipizide tablet before a larger dinner.  At next visit, we may need to increase Trulicity or add mealtime insulin.  Also, I advised her that if the sugars remain elevated in the morning, to move  metformin at dinnertime.  As of now, she is taking it at breakfast to improve compliance. -She is also interested in a CGM and I sent the prescription for the freestyle libre 2 CGM to her pharmacy.  Explained how to use it. - I suggested to:  Patient Instructions  Please continue: - Metformin 2000 mg in am (may move with dinner if sugars remain high in am) - Jardiance 25 mg before breakfast - Trulicity 3 mg weekly - Toujeo 40 units at bedtime  Increase: - Glipizide 5 mg 15-30 min before breakfast and before a larger dinner  Please return in 3-4 months with your sugar log.   - we checked her HbA1c: 7.9% (slightly higher) - advised to check sugars at different times of the day - 2x a day, rotating check times - advised for yearly eye exams >> she is UTD - return to clinic in 3-4 months  2. Obesity class 3 -She had great success on a plant-based diet in the past but she could not continue it.  We discussed about the whole 30 diet or weight watchers. -continue SGLT 2 inhibitor and GLP-1 receptor agonist which should also help with weight loss -She gained 5 pounds before last visit and gained 4 more lbs since then  3. HL -Reviewed latest lipid panel from 08/2020: Fractions at goal with the exception of a slightly high triglyceride level Lab Results  Component Value Date   CHOL 181 08/23/2020   HDL 58 08/23/2020   LDLCALC 93 08/23/2020   LDLDIRECT 140.1 10/06/2011   TRIG 177 (H) 08/23/2020   CHOLHDL 3.1 08/23/2020  -Continues Lipitor 40 mg daily without side effects  Philemon Kingdom, MD PhD [facility] Endocrinology

## 2020-12-17 NOTE — Patient Instructions (Addendum)
Please continue: - Metformin 2000 mg in am (may move with dinner if sugars remain high in am) - Jardiance 25 mg before breakfast - Trulicity 3 mg weekly - Toujeo 40 units at bedtime  Increase: - Glipizide 5 mg 15-30 min before breakfast and before a larger dinner  Please return in 3-4 months with your sugar log.

## 2020-12-17 NOTE — Addendum Note (Signed)
Addended by: Tyrone Apple on: 12/17/2020 11:43 AM   Modules accepted: Orders

## 2020-12-18 ENCOUNTER — Other Ambulatory Visit (HOSPITAL_COMMUNITY): Payer: Self-pay

## 2020-12-18 ENCOUNTER — Telehealth: Payer: Self-pay | Admitting: Pharmacy Technician

## 2020-12-18 NOTE — Telephone Encounter (Addendum)
Patient Advocate Encounter   Received notification from [facility] that prior authorization for TOUJEO MAX is required.   PA submitted on 12/18/2020 Key [id number] Status is APPROVED Through 12/17/2021 AUTH CODE:  [id number]    [facility] will continue to follow.   Venida Jarvis. Nadara Mustard, CPhT Patient Advocate [facility] Phone: [phone] Fax:  [phone]

## 2020-12-19 ENCOUNTER — Other Ambulatory Visit (HOSPITAL_COMMUNITY): Payer: Self-pay

## 2020-12-27 ENCOUNTER — Other Ambulatory Visit (HOSPITAL_COMMUNITY): Payer: Self-pay

## 2020-12-30 ENCOUNTER — Other Ambulatory Visit (HOSPITAL_COMMUNITY): Payer: Self-pay

## 2020-12-30 DIAGNOSIS — F410 Panic disorder [episodic paroxysmal anxiety] without agoraphobia: Secondary | ICD-10-CM | POA: Diagnosis not present

## 2020-12-30 DIAGNOSIS — F3181 Bipolar II disorder: Secondary | ICD-10-CM | POA: Diagnosis not present

## 2020-12-30 DIAGNOSIS — F900 Attention-deficit hyperactivity disorder, predominantly inattentive type: Secondary | ICD-10-CM | POA: Diagnosis not present

## 2020-12-30 MED ORDER — BUPROPION HCL ER (XL) 300 MG PO TB24
300.0000 mg | ORAL_TABLET | Freq: Every morning | ORAL | 0 refills | Status: DC
  Filled 2020-12-30 – 2021-01-31 (×2): qty 90, 90d supply, fill #0

## 2020-12-31 ENCOUNTER — Other Ambulatory Visit (HOSPITAL_COMMUNITY): Payer: Self-pay

## 2021-01-07 ENCOUNTER — Other Ambulatory Visit (HOSPITAL_COMMUNITY): Payer: Self-pay

## 2021-01-31 ENCOUNTER — Other Ambulatory Visit: Payer: Self-pay | Admitting: Family Medicine

## 2021-01-31 ENCOUNTER — Other Ambulatory Visit (HOSPITAL_COMMUNITY): Payer: Self-pay

## 2021-01-31 DIAGNOSIS — I10 Essential (primary) hypertension: Secondary | ICD-10-CM

## 2021-02-01 ENCOUNTER — Other Ambulatory Visit (HOSPITAL_COMMUNITY): Payer: Self-pay

## 2021-02-01 MED ORDER — LISINOPRIL 10 MG PO TABS
10.0000 mg | ORAL_TABLET | Freq: Every day | ORAL | 1 refills | Status: DC
Start: 2021-02-01 — End: 2021-10-08
  Filled 2021-02-05: qty 90, 90d supply, fill #0
  Filled 2021-06-18: qty 90, 90d supply, fill #1
  Filled ????-??-??: fill #0

## 2021-02-05 ENCOUNTER — Other Ambulatory Visit (HOSPITAL_COMMUNITY): Payer: Self-pay

## 2021-02-11 ENCOUNTER — Other Ambulatory Visit (HOSPITAL_COMMUNITY): Payer: Self-pay

## 2021-02-19 ENCOUNTER — Other Ambulatory Visit (HOSPITAL_COMMUNITY): Payer: Self-pay

## 2021-03-05 ENCOUNTER — Other Ambulatory Visit (HOSPITAL_COMMUNITY): Payer: Self-pay

## 2021-03-07 ENCOUNTER — Other Ambulatory Visit (HOSPITAL_COMMUNITY): Payer: Self-pay

## 2021-03-31 ENCOUNTER — Other Ambulatory Visit (HOSPITAL_COMMUNITY): Payer: Self-pay

## 2021-03-31 DIAGNOSIS — F410 Panic disorder [episodic paroxysmal anxiety] without agoraphobia: Secondary | ICD-10-CM | POA: Diagnosis not present

## 2021-03-31 DIAGNOSIS — F900 Attention-deficit hyperactivity disorder, predominantly inattentive type: Secondary | ICD-10-CM | POA: Diagnosis not present

## 2021-03-31 DIAGNOSIS — F3181 Bipolar II disorder: Secondary | ICD-10-CM | POA: Diagnosis not present

## 2021-03-31 MED ORDER — BUPROPION HCL ER (XL) 300 MG PO TB24
ORAL_TABLET | ORAL | 0 refills | Status: DC
  Filled 2021-03-31 – 2021-06-18 (×2): qty 90, 90d supply, fill #0

## 2021-04-02 ENCOUNTER — Encounter: Payer: Self-pay | Admitting: Internal Medicine

## 2021-04-02 ENCOUNTER — Other Ambulatory Visit: Payer: Self-pay

## 2021-04-02 ENCOUNTER — Ambulatory Visit: Payer: 59 | Admitting: Internal Medicine

## 2021-04-02 VITALS — BP 122/82 | HR 128 | Ht 67.0 in | Wt 263.2 lb

## 2021-04-02 DIAGNOSIS — E785 Hyperlipidemia, unspecified: Secondary | ICD-10-CM | POA: Diagnosis not present

## 2021-04-02 DIAGNOSIS — E113291 Type 2 diabetes mellitus with mild nonproliferative diabetic retinopathy without macular edema, right eye: Secondary | ICD-10-CM | POA: Diagnosis not present

## 2021-04-02 DIAGNOSIS — Z794 Long term (current) use of insulin: Secondary | ICD-10-CM | POA: Diagnosis not present

## 2021-04-02 LAB — POCT GLYCOSYLATED HEMOGLOBIN (HGB A1C): Hemoglobin A1C: 8.2 % — AB (ref 4.0–5.6)

## 2021-04-02 MED ORDER — DEXCOM G6 TRANSMITTER MISC: 1.0000 | 3 refills | Status: DC

## 2021-04-02 MED ORDER — DEXCOM G6 SENSOR MISC: 1.0000 | 3 refills | Status: DC

## 2021-04-02 MED ORDER — DEXCOM G6 RECEIVER DEVI: 1.0000 | Freq: Once | 0 refills | Status: DC

## 2021-04-02 NOTE — Progress Notes (Signed)
Patient ID: Sarah Walls, female   DOB: 1961-04-14, 60 y.o.   MRN: [id number]   This visit occurred during the SARS-CoV-2 public health emergency.  Safety protocols were in place, including screening questions prior to the visit, additional usage of staff PPE, and extensive cleaning of exam room while observing appropriate contact time as indicated for disinfecting solutions.   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 Last visit 3.5 months ago.  Interim history: No increased urination, blurry vision, chest pain. She has nausea - has Meniere's ds. >> improved. She had a 2dECHO 10/2020 >> EF 50%  - checked 2/2 chronic tachycardia. She was working nights, but she switched to working days - 6 weeks ago.  Reviewed HbA1c levels: Lab Results  Component Value Date   HGBA1C 7.9 (A) 12/17/2020   HGBA1C 7.8 (A) 08/16/2020   HGBA1C 8.9 (A) 02/13/2020   HGBA1C 8.8 (A) 03/21/2019   HGBA1C 10.1 (A) 08/10/2018   HGBA1C 8.1 (A) 03/04/2018   HGBA1C 8.3 10/29/2017   HGBA1C 9.4 07/23/2017   HGBA1C 7.6 02/12/2017   HGBA1C 8.8 11/27/2016   She is on: - Metformin 2000 mg with dinner >> ER 2000 mg with dinner >>  in am - Glipizide 5 mg before breakfast-added 07/2019 >> did not start 5 mg before dinner yet - Jardiance 25 mg before breakfast - Trulicity 1.5 >> 3 mg weekly in a.m.- restarted 01/2020 - Toujeo 60 units in a.m.>> restarted 20-40 units in a.m. 01/2020 >> 36-40 >> 40 units daily Unfortunately, 3 weeks before last visit, she came off Jardiance as she could not refill it We stopped Januvia when we started Trulicity.  Pt checks her sugars 1-2 times a day - am: 80, 93-166 >> 80, 103, 110, 130-160, 200 >> 88, 97, 103-170 - 2h after b'fast: 202-238 >> 210-230 >> 174--240 >> 169, 202 - before lunch: 213 > 204-211 >> n/c >> 174 >> n/c - 2h after lunch: 226-248, 328 >> n/c >> 212  >> n/c - before dinner:  189 >> 195-215 >> 179 >> n/c  >> 104, 139 >> n/c - 2h after dinner:  203 >> n/c >> 225 >> 209, 218 >> 245, 276 >> n/c - bedtime: n/c >> 128-204 >> 190-270 >> 150-189 >> n/c - nighttime: n/c Lowest sugar was 80 >> 88; she has hypoglycemia awareness in the 70s. Highest sugar was 276 >> 170  Glucometer: AccuChek  Pt's meals are: - Breakfast: b'fast sandwich + yoghurt; boiled eggs + slice of bacon + yoghurt - Lunch: frozen meal +/- salad - Dinner: frozen meals - Snacks: zucchini bread, apple  -No CKD, last BUN/creatinine:  Lab Results  Component Value Date   BUN 10 08/23/2020   BUN 6 (L) 05/01/2019   CREATININE 0.69 08/23/2020   CREATININE 0.51 (L) 05/01/2019  On lisinopril.  -+ HL; last set of lipids: Lab Results  Component Value Date   CHOL 181 08/23/2020   HDL 58 08/23/2020   LDLCALC 93 08/23/2020   LDLDIRECT 140.1 10/06/2011   TRIG 177 (H) 08/23/2020   CHOLHDL 3.1 08/23/2020  On Lipitor 40.  - last eye exam was in 07/2020: No DR, previously + mild DR; she has a history of cataract surgery in 2016.  - no numbness and tingling in her feet.  ROS: + See HPI  I reviewed pt's medications, allergies, PMH, social hx, family hx, and changes were documented  in the history of present illness. Otherwise, unchanged from my initial visit note.  Past Medical History:  Diagnosis Date   AC joint pain 08/13/2011   Allergy    Arthritis    Back pain 11/20/2010   Bipolar disorder ([place])    Cataract    Depression    BAD2 - Dr. Albertine Patricia   Diabetes mellitus without complication ([place])    Hyperlipidemia    Hypertension    Panic attacks    BAD2 - Dr. 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    Social History Main Topics   Smoking status: Never Smoker   Smokeless tobacco: Never Used   Alcohol use No   Drug use: No   Social History Narrative   05/2009 Masters in counseling in disabled.   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       Mother lived in home.  (Deceased quickly Spring 09 from metastatic melanoma)   Current Outpatient Medications on File Prior to Visit  Medication Sig Dispense Refill   ACCU-CHEK FASTCLIX LANCETS MISC Check blood 1 - 2 times daily and as directed. Dx E11.65 200 each 1   ACCU-CHEK GUIDE test strip CHECK BLOOD 1 - 2 TIMES DAILY AND AS DIRECTED. DX E11.65 200 strip 1   ALPRAZolam (XANAX) 0.5 MG tablet Take 0.5 mg by mouth at bedtime as needed (for panic attacks.). May take up to 4 tablets per day as needed     aspirin 81 MG tablet Take 81 mg by mouth daily.     atorvastatin (LIPITOR) 80 MG tablet Take 1 tablet (80 mg total) by mouth daily at 6 PM. 90 tablet 3   buPROPion (WELLBUTRIN XL) 150 MG 24 hr tablet Take 1 tablet (150 mg total) by mouth every morning. 90 tablet 0   buPROPion (WELLBUTRIN XL) 300 MG 24 hr tablet Take 1 tablet by mouth daily upon wakening 90 tablet 0   chlorhexidine (PERIDEX) 0.12 % solution   3   Continuous Blood Gluc Receiver (FREESTYLE LIBRE 2 READER) DEVI Use daily. 1 each 0   Continuous Blood Gluc Sensor (FREESTYLE LIBRE 2 SENSOR) MISC Apply 1 sensor every 14 (fourteen) days. 6 each 3   Dulaglutide (TRULICITY) 3 BZ/1.6RC SOPN Inject 3 mg into the skin once a week. 2 mL 5   empagliflozin (JARDIANCE) 25 MG TABS tablet Take 1 tablet (25 mg total) by mouth daily. 90 tablet 3   glipiZIDE (GLUCOTROL) 5  MG tablet Take 1 tablet (5 mg total) by mouth 2 (two) times daily before a meal. 180 tablet 3   insulin glargine, 2 Unit Dial, (TOUJEO MAX SOLOSTAR) 300 UNIT/ML Solostar Pen Inject 40 Units into the skin daily. 9 mL 3   Insulin Pen Needle (B-D UF III MINI PEN NEEDLES)  31G X 5 MM MISC USE DAILY WITH INSULIN PEN 100 each 3   lisinopril (ZESTRIL) 10 MG tablet Take 1 tablet (10 mg total) by mouth at bedtime. 90 tablet 1   metFORMIN (GLUCOPHAGE-XR) 500 MG 24 hr tablet Take 4 tablets (2,000 mg total) by mouth daily with supper. 360 tablet 3   metoprolol tartrate (LOPRESSOR) 25 MG tablet TAKE 0.5 TABLETS BY MOUTH DAILY. 45 tablet 1   Multiple Vitamin (MULTIVITAMIN) tablet Take 1 tablet by mouth daily.     ondansetron (ZOFRAN ODT) 4 MG disintegrating tablet Take 1 tablet (4 mg total) by mouth every 8 (eight) hours as needed for nausea or vomiting. 20 tablet 0   terbinafine (LAMISIL) 1 % cream Apply 1 application topically 2 (two) times daily. 30 g 1   No current facility-administered medications on file prior to visit.   Allergies  Allergen Reactions   Beta Adrenergic Blockers Other (See Comments)    Drowsy, fatigue   Family History  Problem Relation Age of Onset   Cancer Mother        Metastatic liver CA   Thyroid disease Mother        Hypothyroidism   Vision loss Mother        Eye tumor, removed orbit at the end of 2004   Melanoma Mother    Diabetes Mother    Diabetes Father    Heart disease Father        CABG, coronary disease   Thyroid disease Sister        Hypothyroidism   Colon cancer Maternal Aunt 45   Breast cancer Neg Hx    Colon polyps Neg Hx    Esophageal cancer Neg Hx    Rectal cancer Neg Hx    Stomach cancer Neg Hx    PE: BP 122/82 (BP Location: Right Arm, Patient Position: Sitting, Cuff Size: Normal)   Pulse (!) 128   Ht 5\' 7"  (1.702 m)   Wt 263 lb 3.2 oz (119.4 kg)   LMP 08/28/2015 (Approximate)   SpO2 97%   BMI 41.22 kg/m  Wt Readings from Last 3 Encounters:   04/02/21 263 lb 3.2 oz (119.4 kg)  12/17/20 263 lb (119.3 kg)  11/08/20 260 lb (117.9 kg)   Constitutional: overweight, in NAD Eyes: PERRLA, EOMI, no exophthalmos ENT: moist mucous membranes, no thyromegaly, no cervical lymphadenopathy Cardiovascular: Tachycardia, RR, No MRG Respiratory: CTA B Gastrointestinal: abdomen soft, NT, ND, BS+ Musculoskeletal: no deformities, strength intact in all 4 Skin: moist, warm, no rashes Neurological: no tremor with outstretched hands, DTR normal in all 4  ASSESSMENT: 1. 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 Obesity class 3  3. HL  PLAN:  1. Patient with longstanding, uncontrolled, type 2 diabetes, on oral antidiabetic regimen with metformin, sulfonylurea, SGLT2 inhibitor, weekly GLP-1 receptor agonist, and also long-acting insulin, with still poor control.  At last visit HbA1c was higher, at 7.9%.  At that time, I advised her to add a glipizide tablet before a larger dinner, also.  I also advised her to try to move metformin at night if sugars remain elevated in the morning.  However, in the past, she was forgetting metformin at night.  Since last visit, PA for Toujeo was approved.  At last visit I sent prescription for the freestyle libre CGM to her pharmacy. -Since last visit, she was able to start working days rather than nights and I advised her that blood sugars usually improve with this switch -  At this visit, she tells me that she did not start taking the 5 mg of glipizide before dinner.  She continues to take metformin in the morning.  She is only checking sugars in the morning so it is unclear whether they are increasing or not throughout the day.  Sugars in the morning appear to be slightly better than at last visit. -At this visit, I strongly advised him to start checking sugars later in the day.  A freestyle libre CGM was not approved for her, but she mentions her Dexcom is covered so at today's visit, I  printed prescriptions to be faxed to [facility] [facility]. -I also advised her to check sugars before and after dinner and if the sugars increase by more than 30 to 50 mg/dL after this meal, to add 5 mg of glipizide before dinner.  If sugars are not increasing significantly after dinner, she can only take this if she has larger meals, especially the holidays coming up.  If the sugars continue to increase, we may need to add mealtime insulin at next visit. - I suggested to:  Patient Instructions  Please continue: - Metformin 2000 mg in am  - Glipizide 5 mg 15-30 min before breakfast and add 5 mg before dinner - Jardiance 25 mg before breakfast - Trulicity 3 mg weekly - Toujeo 40 units at bedtime  Try to start the Dexcom CGM.  Please return in 3-4 months.   - we checked her HbA1c: 8.2% (higher) - advised to check sugars at different times of the day - 2x a day, rotating check times - advised for yearly eye exams >> she is UTD - return to clinic in 4 months  2. Obesity class 3 -She had great success on a plant-based diet in the past but she cannot continue.  We discussed about a whole 30 diet and weight watchers in the past. -continue SGLT 2 inhibitor and GLP-1 receptor agonist which should also help with weight loss -She gained a cumulative 9 pounds before the last 2 visits  -Weight stable at this visit  3. HL -Reviewed latest lipid panel from 08/2020: LDL higher than our goal of less than 70, triglycerides also slightly high: Lab Results  Component Value Date   CHOL 181 08/23/2020   HDL 58 08/23/2020   LDLCALC 93 08/23/2020   LDLDIRECT 140.1 10/06/2011   TRIG 177 (H) 08/23/2020   CHOLHDL 3.1 08/23/2020  -Continues Lipitor 40 mg daily without side effects  Philemon Kingdom, MD PhD [facility] Endocrinology

## 2021-04-02 NOTE — Patient Instructions (Addendum)
Please continue: - Metformin 2000 mg in am  - Glipizide 5 mg 15-30 min before breakfast and add 5 mg before dinner - Jardiance 25 mg before breakfast - Trulicity 3 mg weekly - Toujeo 40 units at bedtime  Try to start the Dexcom CGM.  Please return in 3-4 months.

## 2021-04-07 ENCOUNTER — Encounter: Payer: Self-pay | Admitting: Family Medicine

## 2021-04-07 ENCOUNTER — Other Ambulatory Visit: Payer: Self-pay

## 2021-04-07 ENCOUNTER — Ambulatory Visit: Payer: 59 | Admitting: Family Medicine

## 2021-04-07 VITALS — BP 135/97 | HR 122 | Ht 67.0 in | Wt 263.0 lb

## 2021-04-07 DIAGNOSIS — M19012 Primary osteoarthritis, left shoulder: Secondary | ICD-10-CM | POA: Diagnosis not present

## 2021-04-07 DIAGNOSIS — R000 Tachycardia, unspecified: Secondary | ICD-10-CM | POA: Diagnosis not present

## 2021-04-07 NOTE — Patient Instructions (Signed)
Thank you for coming in today. It appears that your may have arthritis of the sternoclavicular joint. You can use voltaren gel if becomes painful or follow up if more issues arise.   Dr. Janus Molder

## 2021-04-07 NOTE — Progress Notes (Signed)
      SUBJECTIVE:   CHIEF COMPLAINT / HPI:   Ms. Tortora is a 60 yo F who presents with her husband for the issue below.   Knot on collar bone Noticed a knot on left collar bone last week after returning from a beach trip. Denies pain at the site, chest pain, pain with movement, fevers, or palpitations.   Chronic tachycardia As above. No chest pain, no palpitations, no light-headedness or dizziness.   PERTINENT  PMH / PSH: HTN, OA  OBJECTIVE:   BP (!) 135/97   Pulse (!) 122   Ht 5\' 7"  (1.702 m)   Wt 263 lb (119.3 kg)   LMP 08/28/2015 (Approximate)   SpO2 97%   BMI 41.19 kg/m   General: Appears well, no acute distress. Age appropriate. Husband in exam room.  Cardiac: Tachycardic, regular rate and rhythm, normal heart sounds, no murmurs Respiratory: CTAB, normal effort MSK/Extremities: Prominent left sternoclavicular joint compared to the right. No skin changes. Non-TTP. Normal ROM of joint.  Skin: Warm and dry, no rashes noted Neuro: alert and oriented Psych: normal affect  ASSESSMENT/PLAN:   1. Osteoarthritis of left sternoclavicular joint Hx of OA. Non tender prominent joint. No system sx or B symptoms. Discussed supportive care measure such as voltaren gel and continued motion of the joint. Can follow up if more associated symptoms arise.   2. Tachycardia Chronic. Increased HR today. Normal rhythm. Reviewed prior work up. Echo, EKG, TSH, CBC unremarkable for structural abnormalities, arrhythmias, hypothyroidism, anemia. PCP can continue to monitor with follow up visits.   Gerlene Fee, [place]

## 2021-04-07 NOTE — Progress Notes (Deleted)
    SUBJECTIVE:   CHIEF COMPLAINT / HPI:   ***  PERTINENT  PMH / PSH: ***  OBJECTIVE:   BP (!) 135/97   Pulse (!) 122   Ht 5\' 7"  (1.702 m)   Wt 263 lb (119.3 kg)   LMP 08/28/2015 (Approximate)   SpO2 97%   BMI 41.19 kg/m   ***  ASSESSMENT/PLAN:   No problem-specific Assessment & Plan notes found for this encounter.     Gerlene Fee, [place]

## 2021-04-16 ENCOUNTER — Other Ambulatory Visit (HOSPITAL_COMMUNITY): Payer: Self-pay

## 2021-04-21 ENCOUNTER — Telehealth: Payer: Self-pay

## 2021-04-21 DIAGNOSIS — E113291 Type 2 diabetes mellitus with mild nonproliferative diabetic retinopathy without macular edema, right eye: Secondary | ICD-10-CM

## 2021-04-21 DIAGNOSIS — Z794 Long term (current) use of insulin: Secondary | ICD-10-CM

## 2021-04-21 NOTE — Telephone Encounter (Signed)
Inbound fax from [facility] requesting forms be completed and faxed. Forms completed and faxed to [phone]

## 2021-04-23 ENCOUNTER — Other Ambulatory Visit (HOSPITAL_COMMUNITY): Payer: Self-pay

## 2021-04-26 ENCOUNTER — Other Ambulatory Visit (HOSPITAL_COMMUNITY): Payer: Self-pay

## 2021-04-28 ENCOUNTER — Other Ambulatory Visit (HOSPITAL_COMMUNITY): Payer: Self-pay

## 2021-05-28 MED ORDER — DEXCOM G6 SENSOR MISC: 3 refills | Status: DC

## 2021-05-28 MED ORDER — DEXCOM G6 TRANSMITTER MISC: 3 refills | Status: DC

## 2021-05-28 MED ORDER — DEXCOM G6 RECEIVER DEVI: 0 refills | Status: DC

## 2021-05-28 NOTE — Addendum Note (Signed)
Addended by: Lauralyn Primes on: 05/28/2021 09:11 AM   Modules accepted: Orders

## 2021-05-28 NOTE — Telephone Encounter (Signed)
Pt contacted provider requesting rx be sent to [facility].

## 2021-06-18 ENCOUNTER — Other Ambulatory Visit (HOSPITAL_COMMUNITY): Payer: Self-pay

## 2021-06-20 ENCOUNTER — Other Ambulatory Visit (HOSPITAL_COMMUNITY): Payer: Self-pay

## 2021-06-23 ENCOUNTER — Telehealth: Payer: Self-pay | Admitting: Internal Medicine

## 2021-06-23 DIAGNOSIS — E113291 Type 2 diabetes mellitus with mild nonproliferative diabetic retinopathy without macular edema, right eye: Secondary | ICD-10-CM

## 2021-06-23 DIAGNOSIS — Z794 Long term (current) use of insulin: Secondary | ICD-10-CM

## 2021-06-23 MED ORDER — DEXCOM G6 TRANSMITTER MISC: 1.0000 | 3 refills | Status: DC

## 2021-06-23 MED ORDER — DEXCOM G6 RECEIVER DEVI: 1.0000 | Freq: Once | 0 refills | Status: DC

## 2021-06-23 MED ORDER — DEXCOM G6 SENSOR MISC: 1.0000 | 3 refills | Status: DC

## 2021-06-23 NOTE — Telephone Encounter (Signed)
Rx printed and faxed to [facility] at [phone].

## 2021-06-23 NOTE — Telephone Encounter (Signed)
Patient is is on the phone to follow up regarding RX for Dexcom being sent to DME company . patient advises that Sarah Walls has not received the RX yet  Call back is [phone]

## 2021-07-04 ENCOUNTER — Other Ambulatory Visit: Payer: Self-pay | Admitting: Internal Medicine

## 2021-07-04 ENCOUNTER — Other Ambulatory Visit (HOSPITAL_COMMUNITY): Payer: Self-pay

## 2021-07-04 ENCOUNTER — Telehealth: Payer: Self-pay

## 2021-07-04 DIAGNOSIS — F3181 Bipolar II disorder: Secondary | ICD-10-CM | POA: Diagnosis not present

## 2021-07-04 DIAGNOSIS — F900 Attention-deficit hyperactivity disorder, predominantly inattentive type: Secondary | ICD-10-CM | POA: Diagnosis not present

## 2021-07-04 DIAGNOSIS — F410 Panic disorder [episodic paroxysmal anxiety] without agoraphobia: Secondary | ICD-10-CM | POA: Diagnosis not present

## 2021-07-04 MED ORDER — UNIFINE PENTIPS 31G X 5 MM MISC
3 refills | Status: AC
  Filled 2021-07-04: qty 100, 90d supply, fill #0
  Filled 2021-11-07: qty 100, 90d supply, fill #1
  Filled 2022-03-12: qty 100, 90d supply, fill #2
  Filled 2022-06-04: qty 100, 90d supply, fill #3

## 2021-07-04 MED ORDER — BUPROPION HCL ER (XL) 300 MG PO TB24
ORAL_TABLET | ORAL | 1 refills | Status: DC
  Filled 2021-10-08: qty 90, 90d supply, fill #0
  Filled 2022-01-13: qty 90, 90d supply, fill #1

## 2021-07-04 NOTE — Telephone Encounter (Signed)
Called and advised pt paperwork completed and faxed to [facility] at [phone].

## 2021-07-05 ENCOUNTER — Other Ambulatory Visit (HOSPITAL_COMMUNITY): Payer: Self-pay

## 2021-07-05 MED ORDER — ALPRAZOLAM 1 MG PO TABS
1.0000 mg | ORAL_TABLET | Freq: Every day | ORAL | 1 refills | Status: DC | PRN
  Filled 2021-07-05: qty 30, 30d supply, fill #0

## 2021-07-09 DIAGNOSIS — E119 Type 2 diabetes mellitus without complications: Secondary | ICD-10-CM | POA: Diagnosis not present

## 2021-07-09 DIAGNOSIS — Z794 Long term (current) use of insulin: Secondary | ICD-10-CM | POA: Diagnosis not present

## 2021-07-11 ENCOUNTER — Other Ambulatory Visit (HOSPITAL_COMMUNITY): Payer: Self-pay

## 2021-07-18 ENCOUNTER — Other Ambulatory Visit (HOSPITAL_COMMUNITY): Payer: Self-pay

## 2021-08-08 ENCOUNTER — Ambulatory Visit: Payer: 59 | Admitting: Internal Medicine

## 2021-08-11 ENCOUNTER — Other Ambulatory Visit (HOSPITAL_COMMUNITY): Payer: Self-pay

## 2021-09-02 ENCOUNTER — Other Ambulatory Visit (HOSPITAL_COMMUNITY): Payer: Self-pay

## 2021-09-15 ENCOUNTER — Encounter: Payer: Self-pay | Admitting: Internal Medicine

## 2021-09-15 ENCOUNTER — Ambulatory Visit: Payer: 59 | Admitting: Internal Medicine

## 2021-09-15 VITALS — BP 130/82 | HR 117 | Ht 67.0 in | Wt 264.6 lb

## 2021-09-15 DIAGNOSIS — E113291 Type 2 diabetes mellitus with mild nonproliferative diabetic retinopathy without macular edema, right eye: Secondary | ICD-10-CM | POA: Diagnosis not present

## 2021-09-15 DIAGNOSIS — E785 Hyperlipidemia, unspecified: Secondary | ICD-10-CM | POA: Diagnosis not present

## 2021-09-15 DIAGNOSIS — Z794 Long term (current) use of insulin: Secondary | ICD-10-CM | POA: Diagnosis not present

## 2021-09-15 LAB — POCT GLYCOSYLATED HEMOGLOBIN (HGB A1C): Hemoglobin A1C: 7.8 % — AB (ref 4.0–5.6)

## 2021-09-15 NOTE — Patient Instructions (Addendum)
Please increase: ?- Metformin 2000 mg in am  ?- Glipizide 5 mg 15-30 min before breakfast and  add 5 mg before dinner ? ?Please continue: ?- Jardiance 25 mg before breakfast ?- Trulicity 3 mg weekly ?- Toujeo 40 units at bedtime ? ?Please return in 3-4 months.  ?

## 2021-09-15 NOTE — Progress Notes (Signed)
Patient ID: Sarah Walls, female   DOB: [birth year], 61 y.o.   MRN: [id number]  ? ?This visit occurred during the SARS-CoV-2 public health emergency.  Safety protocols were in place, including screening questions prior to the visit, additional usage of staff PPE, and extensive cleaning of exam room while observing appropriate contact time as indicated for disinfecting solutions.  ? ?HPI: ?Sarah Walls is a 61 y.o.-year-old female, presenting for f/u for DM2, dx in ~2010, insulin-dependent since ~2015-16, uncontrolled, with complications (mild DR). Last visit 5 months ago. ? ?Interim history: ?No increased urination, blurry vision, chest pain. ?She has nausea - has Meniere's ds. >> improved. ?6 weeks prior to our last visit, she switched from working nights to working days. ?She was finally able to obtain the Dexcom CGM approximately a month ago.  She tried for several months to obtain it from [place] but despite insurance approval, they did not send it to her.  She is now getting it from [facility].   ? ?Reviewed HbA1c levels: ?Lab Results  ?Component Value Date  ? HGBA1C 8.2 (A) 04/02/2021  ? HGBA1C 7.9 (A) 12/17/2020  ? HGBA1C 7.8 (A) 08/16/2020  ? HGBA1C 8.9 (A) 02/13/2020  ? HGBA1C 8.8 (A) 03/21/2019  ? HGBA1C 10.1 (A) 08/10/2018  ? HGBA1C 8.1 (A) 03/04/2018  ? HGBA1C 8.3 10/29/2017  ? HGBA1C 9.4 07/23/2017  ? HGBA1C 7.6 02/12/2017  ? ?She is on: ?- Metformin 2000 mg with dinner >> ER 2000 mg with dinner >>  in am >> 1000 mg 2x a day - but forgetting it at night ?- Glipizide 5 mg before breakfast a (forget) ?- Jardiance 25 mg before breakfast ?- Trulicity 1.5 >> 3 mg weekly in a.m.- restarted 01/2020 ?- Toujeo 60 units in a.m.>> restarted 20-40 units in a.m. 01/2020 >> 36-40 >> 40 units daily ?Unfortunately, 3 weeks before last visit, she came off Jardiance as she could not refill it ?We stopped Januvia when we started Trulicity. ? ?Pt checks her sugars >4x a day - Dexcom ([facility], was not able to obtain it from  [place]): ? ? ? ?Prev. ?- am: 80, 93-166 >> 80, 103, 110, 130-160, 200 >> 88, 97, 103-170 ?- 2h after b'fast: 202-238 >> 210-230 >> 174--240 >> 169, 202 ?- before lunch: 213 > 204-211 >> n/c >> 174 >> n/c ?- 2h after lunch: 226-248, 328 >> n/c >> 212 >> n/c ?- before dinner:  189 >> 195-215 >> 179 >> n/c  >> 104, 139 >> n/c ?- 2h after dinner:  203 >> n/c >> 225 >> 209, 218 >> 245, 276 >> n/c ?- bedtime: n/c >> 128-204 >> 190-270 >> 150-189 >> n/c ?- nighttime: n/c ?Lowest sugar was 80 >> 88; she has hypoglycemia awareness in the 70s. ?Highest sugar was 276 >> 170 ? ?Glucometer: AccuChek ? ?Pt's meals are: ?- Breakfast: b'fast sandwich + yoghurt; boiled eggs + slice of bacon + yoghurt ?- Lunch: frozen meal +/- salad ?- Dinner: frozen meals ?- Snacks: zucchini bread, apple ? ?-No CKD, last BUN/creatinine:  ?Lab Results  ?Component Value Date  ? BUN 10 08/23/2020  ? BUN 6 (L) 05/01/2019  ? CREATININE 0.69 08/23/2020  ? CREATININE 0.51 (L) 05/01/2019  ?On lisinopril. ? ?-+ HL; last set of lipids: ?Lab Results  ?Component Value Date  ? CHOL 181 08/23/2020  ? HDL 58 08/23/2020  ? [place] 93 08/23/2020  ? LDLDIRECT 140.1 10/06/2011  ? TRIG 177 (H) 08/23/2020  ? CHOLHDL 3.1 08/23/2020  ?On Lipitor 40. ? ?-  last eye exam was in fall 2022: No DR, previously + mild DR; she has a history of cataract surgery in 2016. ? ?- no numbness and tingling in her feet. ? ?She had a 2dECHO 10/2020 >> EF 50%  - checked 2/2 chronic tachycardia. ? ?ROS: + See HPI ? ?I reviewed pt's medications, allergies, PMH, social hx, family hx, and changes were documented in the history of present illness. Otherwise, unchanged from my initial visit note. ? ?Past Medical History:  ?Diagnosis Date  ? AC joint pain 08/13/2011  ? Allergy   ? Arthritis   ? Back pain 11/20/2010  ? Bipolar disorder ([place])   ? Cataract   ? Depression   ? BAD2 - Dr. Albertine Patricia  ? Diabetes mellitus without complication ([place])   ? Hyperlipidemia   ? Hypertension   ? Panic attacks   ? BAD2  - Dr. Albertine Patricia  ? Personal history of colonic polyps - adenomas 10/13/2013  ? 12/2013 - 6 adenomas max 12 mm - repeat colonoscopy  2018   ? Tachycardia   ? Tuberculosis   ? positive PPD  ? ?Past Surgical History:  ?Procedure Laterality Date  ? CATARACT EXTRACTION Bilateral 2015  ? [place]  ? Fetal Distress  ? COLONOSCOPY  06/25/2017  ? Carlean Purl  ? EYE SURGERY    ? POLYPECTOMY    ? TONSILLECTOMY  1969  ? ?Social History  ? ?Social History  ? Marital status: Married  ?  Spouse name: N/A  ? Number of children: 1  ? ?Occupational History  ? 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   ? ?Social History Main Topics  ? Smoking status: Never Smoker  ? Smokeless tobacco: Never Used  ? Alcohol use No  ? Drug use: No  ? ?Social History Narrative  ? 05/2009 Masters in counseling in disabled.   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   ?   ? Mother lived in home.  (Deceased quickly Spring 09 from metastatic melanoma)  ? ?Current Outpatient Medications on File Prior to Visit  ?Medication Sig Dispense Refill  ? ACCU-CHEK FASTCLIX LANCETS MISC Check blood 1 - 2 times daily and as directed. Dx E11.65 200 each 1  ? ACCU-CHEK GUIDE test strip CHECK BLOOD 1 - 2 TIMES DAILY AND AS DIRECTED. DX E11.65 200 strip 1  ? ALPRAZolam (XANAX) 0.5 MG tablet Take 0.5 mg by mouth at bedtime as needed (for panic attacks.). May take up to 4 tablets per day as needed    ? ALPRAZolam (XANAX) 1 MG tablet Take 1 tablet (1 mg total) by mouth daily as needed for severe axiety/ panic 30 tablet 1  ? aspirin 81 MG tablet Take 81 mg by mouth daily.    ? atorvastatin (LIPITOR) 80 MG tablet Take 1 tablet (80 mg total) by mouth daily at 6 PM. 90 tablet 3  ? buPROPion (WELLBUTRIN XL) 300 MG 24 hr tablet Take 1 tablet by mouth upon wakening 90 tablet 1  ? chlorhexidine (PERIDEX) 0.12 % solution   3  ? Continuous Blood Gluc Receiver (DEXCOM G6 RECEIVER) DEVI 1 Device by Does not apply route  once for 1 dose. 1 each 0  ? Continuous Blood Gluc Sensor (DEXCOM G6 SENSOR) MISC Inject 1 Device into the skin continuous for 10 days. 9 each 3  ? Continuous Blood Gluc Transmit (DEXCOM G6 TRANSMITTER) MISC 1 Device by Does not apply route every 3 (three) months. 1  each 3  ? Dulaglutide (TRULICITY) 3 TD/4.2AJ SOPN Inject 3 mg into the skin once a week. 2 mL 5  ? empagliflozin (JARDIANCE) 25 MG TABS tablet Take 1 tablet (25 mg total) by mouth daily. 90 tablet 3  ? glipiZIDE (GLUCOTROL) 5 MG tablet Take 1 tablet (5 mg total) by mouth 2 (two) times daily before a meal. 180 tablet 3  ? insulin glargine, 2 Unit Dial, (TOUJEO MAX SOLOSTAR) 300 UNIT/ML Solostar Pen Inject 40 Units into the skin daily. 9 mL 3  ? Insulin Pen Needle (UNIFINE PENTIPS) 31G X 5 MM MISC USE DAILY WITH INSULIN PEN 100 each 3  ? lisinopril (ZESTRIL) 10 MG tablet Take 1 tablet (10 mg total) by mouth at bedtime. 90 tablet 1  ? metFORMIN (GLUCOPHAGE-XR) 500 MG 24 hr tablet Take 4 tablets (2,000 mg total) by mouth daily with supper. 360 tablet 3  ? metoprolol tartrate (LOPRESSOR) 25 MG tablet TAKE 0.5 TABLETS BY MOUTH DAILY. 45 tablet 1  ? Multiple Vitamin (MULTIVITAMIN) tablet Take 1 tablet by mouth daily.    ? ondansetron (ZOFRAN ODT) 4 MG disintegrating tablet Take 1 tablet (4 mg total) by mouth every 8 (eight) hours as needed for nausea or vomiting. 20 tablet 0  ? terbinafine (LAMISIL) 1 % cream Apply 1 application topically 2 (two) times daily. 30 g 1  ? ?No current facility-administered medications on file prior to visit.  ? ?Allergies  ?Allergen Reactions  ? Beta Adrenergic Blockers Other (See Comments)  ?  Drowsy, fatigue  ? ?Family History  ?Problem Relation Age of Onset  ? Cancer Mother   ?     Metastatic liver CA  ? Thyroid disease Mother   ?     Hypothyroidism  ? Vision loss Mother   ?     Eye tumor, removed orbit at the end of 2004  ? Melanoma Mother   ? Diabetes Mother   ? Diabetes Father   ? Heart disease Father   ?     CABG, coronary  disease  ? Thyroid disease Sister   ?     Hypothyroidism  ? Colon cancer Maternal Aunt 40  ? Breast cancer Neg Hx   ? Colon polyps Neg Hx   ? Esophageal cancer Neg Hx   ? Rectal cancer Neg Hx   ? Stomach cancer Ne

## 2021-09-17 ENCOUNTER — Other Ambulatory Visit (HOSPITAL_COMMUNITY): Payer: Self-pay

## 2021-09-17 MED ORDER — AMOXICILLIN 500 MG PO CAPS
ORAL_CAPSULE | ORAL | 0 refills | Status: DC
  Filled 2021-09-17: qty 21, 7d supply, fill #0

## 2021-10-02 DIAGNOSIS — Z794 Long term (current) use of insulin: Secondary | ICD-10-CM | POA: Diagnosis not present

## 2021-10-02 DIAGNOSIS — E119 Type 2 diabetes mellitus without complications: Secondary | ICD-10-CM | POA: Diagnosis not present

## 2021-10-08 ENCOUNTER — Other Ambulatory Visit (HOSPITAL_COMMUNITY): Payer: Self-pay

## 2021-10-08 ENCOUNTER — Other Ambulatory Visit: Payer: Self-pay | Admitting: Student

## 2021-10-08 DIAGNOSIS — I10 Essential (primary) hypertension: Secondary | ICD-10-CM

## 2021-10-08 MED ORDER — LISINOPRIL 10 MG PO TABS
10.0000 mg | ORAL_TABLET | Freq: Every day | ORAL | 1 refills | Status: DC
Start: 2021-10-08 — End: 2022-06-04
  Filled 2021-10-08: qty 90, 90d supply, fill #0
  Filled 2022-02-25: qty 90, 90d supply, fill #1

## 2021-10-09 ENCOUNTER — Other Ambulatory Visit (HOSPITAL_COMMUNITY): Payer: Self-pay

## 2021-10-15 ENCOUNTER — Other Ambulatory Visit (HOSPITAL_COMMUNITY): Payer: Self-pay

## 2021-10-15 DIAGNOSIS — F900 Attention-deficit hyperactivity disorder, predominantly inattentive type: Secondary | ICD-10-CM | POA: Diagnosis not present

## 2021-10-15 DIAGNOSIS — F410 Panic disorder [episodic paroxysmal anxiety] without agoraphobia: Secondary | ICD-10-CM | POA: Diagnosis not present

## 2021-10-15 DIAGNOSIS — F3181 Bipolar II disorder: Secondary | ICD-10-CM | POA: Diagnosis not present

## 2021-10-15 MED ORDER — LURASIDONE HCL 20 MG PO TABS
20.0000 mg | ORAL_TABLET | Freq: Every evening | ORAL | 2 refills | Status: DC
  Filled 2021-10-15: qty 30, 30d supply, fill #0
  Filled 2021-12-03: qty 30, 30d supply, fill #1
  Filled 2022-01-13: qty 30, 30d supply, fill #2

## 2021-10-16 ENCOUNTER — Other Ambulatory Visit (HOSPITAL_COMMUNITY): Payer: Self-pay

## 2021-10-17 ENCOUNTER — Other Ambulatory Visit (HOSPITAL_COMMUNITY): Payer: Self-pay

## 2021-11-03 ENCOUNTER — Encounter: Payer: Self-pay | Admitting: Internal Medicine

## 2021-11-04 NOTE — Progress Notes (Addendum)
    SUBJECTIVE:   CHIEF COMPLAINT / HPI:   Unintentional Weight Loss Feels that she has no appetite, feels that it has been decreasing in the last few weeks. Other people noticed it before she did, she didn't really notice until 1.5 weeks ago. Works 12 hour shifts over at [employer]. She does have bipolar 2, her psychiatrist added Latuda 2 weeks ago but symptoms started before that. No new stress that she can pinpoint. Has not been more active. No blood loss. No night sweats, fevers, chills.   24-hour recall from 5/24 Yogurt for breakfast with coffee.  No lunch 6-inch subway sandwich with chips for dinner.   Today:  Went out to eat for breakfast- ate half a spinach omelet. Coffee and yogurt.   She is due for repeat mammogram in June of this year, last one in 2021 was normal. Last colonoscopy 10/2020, 3 polyps were found and resected, returned as tubular adenomas and patient was recommended for repeat colonoscopy in 2027. Last Pap 09/2020 NILM and HPV negative. Last TSH 08/2020 was within normal range. She   Wt Readings from Last 3 Encounters:  11/06/21 245 lb 9.6 oz (111.4 kg)  09/15/21 264 lb 9.6 oz (120 kg)  04/07/21 263 lb (119.3 kg)    PERTINENT  PMH / PSH:  Past Medical History:  Diagnosis Date   AC joint pain 08/13/2011   Allergy    Arthritis    Back pain 11/20/2010   Bipolar disorder ([place])    Cataract    Depression    BAD2 - Dr. Albertine Patricia   Diabetes mellitus without complication ([place])    Hyperlipidemia    Hypertension    Panic attacks    BAD2 - Dr. Albertine Patricia   Personal history of colonic polyps - adenomas 10/13/2013   12/2013 - 6 adenomas max 12 mm - repeat colonoscopy  2018    Tachycardia    Tuberculosis    positive PPD    OBJECTIVE:   BP (!) 169/91   Pulse (!) 113   Wt 245 lb 9.6 oz (111.4 kg)   LMP 08/28/2015 (Approximate)   SpO2 95%   BMI 38.47 kg/m    General: NAD, pleasant, able to participate in exam Respiratory: Respiratory effort normal Psych: Normal affect and  mood  ASSESSMENT/PLAN:   1. Unintentional weight loss Up to date on cancer screenings. No B symptoms besides unintentional weight loss. No significant stressors or changes to diet or activity to cause weight loss. She has had improvement in her diabetes and her insulin has been lowered which could be affecting it but she has had approximately 19 pound weight loss since the beginning of last month.  We discussed that appetite can change with age but this seems like more of a sudden change. We will do lab work today to rule out other causes of unintentional weight loss. - CBC with Differential - TSH - HIV antibody (with reflex) - Comprehensive metabolic panel - Recommend Ensure supplementation  - Follow-up in 3 months  2. Encounter for screening mammogram for malignant neoplasm of breast Gave patient number to schedule.  - MM Digital Screening; Future   Sharion Settler, [place]

## 2021-11-06 ENCOUNTER — Ambulatory Visit: Payer: 59 | Admitting: Family Medicine

## 2021-11-06 ENCOUNTER — Encounter: Payer: Self-pay | Admitting: Family Medicine

## 2021-11-06 ENCOUNTER — Other Ambulatory Visit: Payer: Self-pay

## 2021-11-06 VITALS — BP 147/95 | HR 113 | Wt 245.6 lb

## 2021-11-06 DIAGNOSIS — Z1231 Encounter for screening mammogram for malignant neoplasm of breast: Secondary | ICD-10-CM

## 2021-11-06 DIAGNOSIS — R634 Abnormal weight loss: Secondary | ICD-10-CM

## 2021-11-06 NOTE — Patient Instructions (Addendum)
It was wonderful to see you today.  Please bring ALL of your medications with you to every visit.   Today we talked about:  We are doing lab work today to check your blood counts, HIV, your electrolytes/kidney/liver function and your thyroid. I will send you a MyChart message if you have MyChart. Otherwise, I will give you a call for abnormal results or send a letter if everything returned back normal. If you don't hear from me in 2 weeks, please call the office.   -You can also start taking Ensure nutritional shakes to supplement -I've ordered your mammogram, call to schedule this.  -If you develop any symptoms, please return.   Thank you for choosing [facility].   Please call [phone] with any questions about today's appointment.  Please be sure to schedule follow up at the front  desk before you leave today.   Sharion Settler, DO PGY-2 Family Medicine

## 2021-11-07 ENCOUNTER — Other Ambulatory Visit (HOSPITAL_COMMUNITY): Payer: Self-pay

## 2021-11-07 DIAGNOSIS — F900 Attention-deficit hyperactivity disorder, predominantly inattentive type: Secondary | ICD-10-CM | POA: Diagnosis not present

## 2021-11-07 DIAGNOSIS — F410 Panic disorder [episodic paroxysmal anxiety] without agoraphobia: Secondary | ICD-10-CM | POA: Diagnosis not present

## 2021-11-07 DIAGNOSIS — F3181 Bipolar II disorder: Secondary | ICD-10-CM | POA: Diagnosis not present

## 2021-11-07 LAB — CBC WITH DIFFERENTIAL/PLATELET
Basophils Absolute: 0.1 10*3/uL (ref 0.0–0.2)
Basos: 0 %
EOS (ABSOLUTE): 0.2 10*3/uL (ref 0.0–0.4)
Eos: 2 %
Hematocrit: 46.6 % (ref 34.0–46.6)
Hemoglobin: 16 g/dL — ABNORMAL HIGH (ref 11.1–15.9)
Immature Grans (Abs): 0 10*3/uL (ref 0.0–0.1)
Immature Granulocytes: 0 %
Lymphocytes Absolute: 3.6 10*3/uL — ABNORMAL HIGH (ref 0.7–3.1)
Lymphs: 28 %
MCH: 30.6 pg (ref 26.6–33.0)
MCHC: 34.3 g/dL (ref 31.5–35.7)
MCV: 89 fL (ref 79–97)
Monocytes Absolute: 1 10*3/uL — ABNORMAL HIGH (ref 0.1–0.9)
Monocytes: 8 %
Neutrophils Absolute: 7.8 10*3/uL — ABNORMAL HIGH (ref 1.4–7.0)
Neutrophils: 62 %
Platelets: 277 10*3/uL (ref 150–450)
RBC: 5.23 x10E6/uL (ref 3.77–5.28)
RDW: 12.7 % (ref 11.7–15.4)
WBC: 12.7 10*3/uL — ABNORMAL HIGH (ref 3.4–10.8)

## 2021-11-07 LAB — COMPREHENSIVE METABOLIC PANEL
ALT: 32 IU/L (ref 0–32)
AST: 24 IU/L (ref 0–40)
Albumin/Globulin Ratio: 2.4 — ABNORMAL HIGH (ref 1.2–2.2)
Albumin: 5 g/dL — ABNORMAL HIGH (ref 3.8–4.8)
Alkaline Phosphatase: 60 IU/L (ref 44–121)
BUN/Creatinine Ratio: 17 (ref 12–28)
BUN: 12 mg/dL (ref 8–27)
Bilirubin Total: 0.6 mg/dL (ref 0.0–1.2)
CO2: 22 mmol/L (ref 20–29)
Calcium: 10.6 mg/dL — ABNORMAL HIGH (ref 8.7–10.3)
Chloride: 99 mmol/L (ref 96–106)
Creatinine, Ser: 0.7 mg/dL (ref 0.57–1.00)
Globulin, Total: 2.1 g/dL (ref 1.5–4.5)
Glucose: 94 mg/dL (ref 70–99)
Potassium: 4.4 mmol/L (ref 3.5–5.2)
Sodium: 138 mmol/L (ref 134–144)
Total Protein: 7.1 g/dL (ref 6.0–8.5)
eGFR: 97 mL/min/{1.73_m2} (ref >59)

## 2021-11-07 LAB — HIV ANTIBODY (ROUTINE TESTING W REFLEX): HIV Screen 4th Generation wRfx: NONREACTIVE

## 2021-11-07 LAB — TSH: TSH: 1.1 u[IU]/mL (ref 0.450–4.500)

## 2021-11-07 MED ORDER — BUPROPION HCL ER (XL) 300 MG PO TB24
ORAL_TABLET | ORAL | 0 refills | Status: DC
  Filled 2021-11-07: qty 90, 90d supply, fill #0

## 2021-11-12 ENCOUNTER — Other Ambulatory Visit (HOSPITAL_COMMUNITY): Payer: Self-pay

## 2021-11-18 ENCOUNTER — Encounter: Payer: Self-pay | Admitting: *Deleted

## 2021-11-30 NOTE — Progress Notes (Signed)
    SUBJECTIVE:   CHIEF COMPLAINT / HPI:   Follow Up Weight Loss Sarah Walls is a 61 y.o. female who presents to the [facility] clinic today for follow up on unintentional weight loss. She was last seen on 5/25, weight at that appointment 245 lbs. Lab work collected at last appointment notable for slight leukocytosis of 12.7 and polycythemia with Hgb 16.   Wt Readings from Last 3 Encounters:  12/05/21 241 lb (109.3 kg)  11/06/21 245 lb 9.6 oz (111.4 kg)  09/15/21 264 lb 9.6 oz (120 kg)   Today patient reports that she feels well. She has been supplementing with nutritional shakes and eating meals regularly despite still not having much of an appetite. She does admit to missing 2 weeks of her Trulicity.  She denies any fevers, hematuria, hematochezia.  Does endorse occasional night sweats but attributes that to warmer weather.  She is up-to-date on her colonoscopy.  She is scheduled to have a mammogram on 6/30.  PERTINENT  PMH / PSH:  Past Medical History:  Diagnosis Date   AC joint pain 08/13/2011   Allergy    Arthritis    Back pain 11/20/2010   Bipolar disorder ([facility])    Cataract    Depression    BAD2 - Dr. Madaline Guthrie   Diabetes mellitus without complication ([facility])    Hyperlipidemia    Hypertension    Panic attacks    BAD2 - Dr. Madaline Guthrie   Personal history of colonic polyps - adenomas 10/13/2013   12/2013 - 6 adenomas max 12 mm - repeat colonoscopy  2018    Tachycardia    Tuberculosis    positive PPD   OBJECTIVE:   BP 140/82   Pulse (!) 112   Ht 5\' 7"  (1.702 m)   Wt 241 lb (109.3 kg)   LMP 08/28/2015 (Approximate)   SpO2 98%   BMI 37.75 kg/m    General: NAD, pleasant, able to participate in exam Cardiac: RRR, no murmurs. Respiratory: CTAB, normal effort, No wheezes, rales or rhonchi Extremities: no edema or cyanosis. Skin: warm and dry, no rashes noted Psych: Normal affect and mood  ASSESSMENT/PLAN:   Unintentional weight loss With 4 pound weight loss since last month.  She  has been making an effort to eat regular meals.  She has had no change in her physical activity.  Her labs collected last visit on and off for slight polycythemia and leukocytosis, otherwise HIV neg and TSH normal. Given significant weight loss of 23 lbs since 4/3, will continue work up. If lab abnormalities persist, will likely send referral to Heme/Onc for additional input. -CBC with diff -Blood smear -Sed rate -F/u in 1 month  Tachycardia Chronic and asymptomatic. On Metoprolol 25 mg. Could consider increase in future as BP mildly elevated as well.    Sabino Dick, DO [facility]

## 2021-12-03 ENCOUNTER — Other Ambulatory Visit: Payer: Self-pay | Admitting: Internal Medicine

## 2021-12-03 ENCOUNTER — Other Ambulatory Visit (HOSPITAL_COMMUNITY): Payer: Self-pay

## 2021-12-03 MED ORDER — METFORMIN HCL ER 500 MG PO TB24
2000.0000 mg | ORAL_TABLET | Freq: Every day | ORAL | 0 refills | Status: DC
  Filled 2021-12-03: qty 360, 90d supply, fill #0

## 2021-12-04 ENCOUNTER — Other Ambulatory Visit (HOSPITAL_COMMUNITY): Payer: Self-pay

## 2021-12-05 ENCOUNTER — Ambulatory Visit: Payer: 59 | Admitting: Family Medicine

## 2021-12-05 ENCOUNTER — Encounter: Payer: Self-pay | Admitting: Family Medicine

## 2021-12-05 VITALS — BP 140/82 | HR 112 | Ht 67.0 in | Wt 241.0 lb

## 2021-12-05 DIAGNOSIS — R000 Tachycardia, unspecified: Secondary | ICD-10-CM | POA: Diagnosis not present

## 2021-12-05 DIAGNOSIS — R634 Abnormal weight loss: Secondary | ICD-10-CM | POA: Diagnosis not present

## 2021-12-05 NOTE — Assessment & Plan Note (Addendum)
With 4 pound weight loss since last month.  She has been making an effort to eat regular meals.  She has had no change in her physical activity.  Her labs collected last visit on and off for slight polycythemia and leukocytosis, otherwise HIV neg and TSH normal. Given significant weight loss of 23 lbs since 4/3, will continue work up. If lab abnormalities persist, will likely send referral to Heme/Onc for additional input. -CBC with diff -Blood smear -Sed rate -F/u in 1 month

## 2021-12-05 NOTE — Patient Instructions (Addendum)
It was wonderful to see you today.  Please bring ALL of your medications with you to every visit.   Today we talked about:  We are doing lab work today to check your blood counts as well as check an inflammatory marker. I will send you a MyChart message if you have MyChart. Otherwise, I will give you a call for abnormal results or send a letter if everything returned back normal. If you don't hear from me in 2 weeks, please call the office.    If there are abnormalities in your labs, I will likely send a referral to Hematology/Oncology for further work up.   I would recommend you talk to your endocrinologist about possibly stopping the Glipizide and for recommendations on how to start back the Trulicity since you have missed a couple doses.   Thank you for choosing [facility].   Please call [phone] with any questions about today's appointment.  Please be sure to schedule follow up at the front  desk before you leave today.   Sabino Dick, DO PGY-2 Family Medicine

## 2021-12-05 NOTE — Assessment & Plan Note (Addendum)
Chronic and asymptomatic. On Metoprolol 25 mg. Could consider increase in future as BP mildly elevated as well.

## 2021-12-06 LAB — CBC WITH DIFFERENTIAL/PLATELET
Basophils Absolute: 0 10*3/uL (ref 0.0–0.2)
Basos: 1 %
EOS (ABSOLUTE): 0.2 10*3/uL (ref 0.0–0.4)
Eos: 3 %
Hematocrit: 47.1 % — ABNORMAL HIGH (ref 34.0–46.6)
Hemoglobin: 15.7 g/dL (ref 11.1–15.9)
Immature Grans (Abs): 0 10*3/uL (ref 0.0–0.1)
Immature Granulocytes: 0 %
Lymphocytes Absolute: 2.9 10*3/uL (ref 0.7–3.1)
Lymphs: 32 %
MCH: 30.3 pg (ref 26.6–33.0)
MCHC: 33.3 g/dL (ref 31.5–35.7)
MCV: 91 fL (ref 79–97)
Monocytes Absolute: 0.7 10*3/uL (ref 0.1–0.9)
Monocytes: 8 %
Neutrophils Absolute: 5 10*3/uL (ref 1.4–7.0)
Neutrophils: 56 %
Platelets: 268 10*3/uL (ref 150–450)
RBC: 5.18 x10E6/uL (ref 3.77–5.28)
RDW: 12.9 % (ref 11.7–15.4)
WBC: 8.8 10*3/uL (ref 3.4–10.8)

## 2021-12-06 LAB — SEDIMENTATION RATE: Sed Rate: 26 mm/hr (ref 0–40)

## 2021-12-11 ENCOUNTER — Encounter: Payer: Self-pay | Admitting: Internal Medicine

## 2021-12-12 ENCOUNTER — Ambulatory Visit
Admission: RE | Admit: 2021-12-12 | Discharge: 2021-12-12 | Disposition: A | Discharge status: Home / Self Care | Payer: 59 | Source: Ambulatory Visit | Attending: Family Medicine | Admitting: Family Medicine

## 2021-12-12 DIAGNOSIS — Z1231 Encounter for screening mammogram for malignant neoplasm of breast: Secondary | ICD-10-CM | POA: Diagnosis not present

## 2021-12-19 ENCOUNTER — Other Ambulatory Visit (HOSPITAL_COMMUNITY): Payer: Self-pay

## 2021-12-19 ENCOUNTER — Other Ambulatory Visit: Payer: Self-pay | Admitting: Internal Medicine

## 2021-12-19 MED ORDER — EMPAGLIFLOZIN 25 MG PO TABS
25.0000 mg | ORAL_TABLET | Freq: Every day | ORAL | 3 refills | Status: DC
Start: 2021-12-19 — End: 2023-01-27
  Filled 2021-12-19: qty 90, 90d supply, fill #0
  Filled 2022-03-26: qty 90, 90d supply, fill #1
  Filled 2022-06-04: qty 90, 90d supply, fill #2
  Filled 2022-07-06 – 2022-10-15 (×2): qty 90, 90d supply, fill #3

## 2021-12-25 NOTE — Progress Notes (Signed)
    SUBJECTIVE:   CHIEF COMPLAINT / HPI:   Follow Up Unintentional Weight Loss Sarah Walls is a 61 y.o. female who presents to the [facility] clinic today for follow up on unintentional weight loss. She was last seen on 6/23 and weight at that visit was 241 lbs, which was a 4 lb loss since May and a 23 lb loss since April. She had an unremarkable CBC, sed rate at that time. Blood smear showed "a polycythemia-like pattern due to to the combination of high normal count and MCV values", observations uncertain unless persistent or progressive.  Otherwise pathology review appeared normal. Previous work up for this TSH and HIV also unrevealing. Shared decision making was made to continue close follow ups for monitoring. Today she reports that she is still losing weight but overall feels well.   Weight today 233 lbs. She is thinking that the weight loss may be due to having a glucometer and being more aware of her sugar levels. She has had it for the last 4-5 months. She thinks maybe she is eating better as a result. No change in physical activity.  Insulin has come down by 10U.  PERTINENT  PMH / PSH: Type 2 diabetes, hyperlipidemia, bipolar disorder, hypertension  OBJECTIVE:   BP 112/62   Pulse (!) 54   Wt 233 lb (105.7 kg)   LMP 08/28/2015 (Approximate)   SpO2 98%   BMI 36.49 kg/m   Manual pulse: 70   General: NAD, pleasant, able to participate in exam Cardiac: Irregular rate, no murmurs Respiratory: Normal respiratory effort Psych: Normal affect and mood  ASSESSMENT/PLAN:   Unintentional weight loss Has lost an additional 8 pounds since her visit last month. Question if the Trulicity is the cause of this, despite it not being a new medication. She otherwise feels well and has no B symptoms. Recommend patient to discuss with Endocrinologist on thoughts regarding this; could trial off the medication and see what happens with weight?  -F/u in 1 month for re-evaluation with me   PVC (premature  ventricular contraction) Patient's initial pulse was bradycardic, very unusual for her as she is normally tachycardic. Her pulse seemed irregular on manual check which is why EKG was ordered. There was artifact with the EKG as patient had lotion on her chest. 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 The PVC burden seems high with 5 in 6-second EKG strip. Patient is asymptomatic. I reviewed her Echo from May 2022 which showed an EF of 50%, no regional wall abnormalities, no mitral or aortic regurgitation.   -Patient to return for RN visit tomorrow, 7/26 for repeat EKG -Will do BMP and Mg tomorrow as well to assess for electrolyte abnormalities -If PVC's persist, will refer to cardiology -She likely needs to restart her Metoprolol (reported this was stopped, unclear why)     Sharion Settler, [place]

## 2021-12-26 ENCOUNTER — Other Ambulatory Visit: Payer: Self-pay | Admitting: Family Medicine

## 2021-12-26 DIAGNOSIS — E785 Hyperlipidemia, unspecified: Secondary | ICD-10-CM

## 2021-12-27 ENCOUNTER — Other Ambulatory Visit (HOSPITAL_COMMUNITY): Payer: Self-pay

## 2021-12-27 MED ORDER — ATORVASTATIN CALCIUM 80 MG PO TABS
80.0000 mg | ORAL_TABLET | Freq: Every day | ORAL | 3 refills | Status: DC
  Filled 2021-12-27: qty 90, 90d supply, fill #0
  Filled 2022-02-25 – 2022-04-19 (×2): qty 90, 90d supply, fill #1
  Filled 2022-06-04 – 2022-08-03 (×2): qty 90, 90d supply, fill #2
  Filled 2022-11-11: qty 90, 90d supply, fill #3

## 2021-12-31 LAB — PATHOLOGIST SMEAR REVIEW
Basophils Absolute: 0.1 10*3/uL (ref 0.0–0.2)
Basos: 1 %
EOS (ABSOLUTE): 0.2 10*3/uL (ref 0.0–0.4)
Eos: 2 %
Hematocrit: 47 % — ABNORMAL HIGH (ref 34.0–46.6)
Hemoglobin: 15.7 g/dL (ref 11.1–15.9)
Immature Grans (Abs): 0 10*3/uL (ref 0.0–0.1)
Immature Granulocytes: 0 %
Lymphocytes Absolute: 3 10*3/uL (ref 0.7–3.1)
Lymphs: 32 %
MCH: 30.2 pg (ref 26.6–33.0)
MCHC: 33.4 g/dL (ref 31.5–35.7)
MCV: 90 fL (ref 79–97)
Monocytes Absolute: 0.8 10*3/uL (ref 0.1–0.9)
Monocytes: 9 %
Neutrophils Absolute: 5.2 10*3/uL (ref 1.4–7.0)
Neutrophils: 56 %
Path Rev PLTs: NORMAL
Path Rev WBC: NORMAL
Platelets: 270 10*3/uL (ref 150–450)
RBC: 5.2 x10E6/uL (ref 3.77–5.28)
RDW: 12.9 % (ref 11.7–15.4)
WBC: 9.2 10*3/uL (ref 3.4–10.8)

## 2022-01-02 ENCOUNTER — Other Ambulatory Visit (HOSPITAL_COMMUNITY): Payer: Self-pay

## 2022-01-02 DIAGNOSIS — F410 Panic disorder [episodic paroxysmal anxiety] without agoraphobia: Secondary | ICD-10-CM | POA: Diagnosis not present

## 2022-01-02 DIAGNOSIS — F900 Attention-deficit hyperactivity disorder, predominantly inattentive type: Secondary | ICD-10-CM | POA: Diagnosis not present

## 2022-01-02 DIAGNOSIS — F3181 Bipolar II disorder: Secondary | ICD-10-CM | POA: Diagnosis not present

## 2022-01-02 MED ORDER — BUPROPION HCL ER (XL) 300 MG PO TB24
ORAL_TABLET | ORAL | 0 refills | Status: DC
  Filled 2022-01-02: qty 90, 90d supply, fill #0

## 2022-01-02 MED ORDER — LURASIDONE HCL 20 MG PO TABS
ORAL_TABLET | ORAL | 5 refills | Status: DC
  Filled 2022-01-02: qty 30, 30d supply, fill #0

## 2022-01-04 ENCOUNTER — Other Ambulatory Visit: Payer: Self-pay | Admitting: Internal Medicine

## 2022-01-05 ENCOUNTER — Other Ambulatory Visit (HOSPITAL_COMMUNITY): Payer: Self-pay

## 2022-01-05 MED ORDER — TOUJEO MAX SOLOSTAR 300 UNIT/ML ~~LOC~~ SOPN
40.0000 [IU] | PEN_INJECTOR | Freq: Every day | SUBCUTANEOUS | 3 refills | Status: DC
Start: 2022-01-05 — End: 2022-12-11
  Filled 2022-01-05 – 2022-01-17 (×2): qty 9, 67d supply, fill #0
  Filled 2022-04-23: qty 9, 67d supply, fill #1
  Filled 2022-06-04 – 2022-08-03 (×2): qty 9, 67d supply, fill #2

## 2022-01-05 NOTE — Patient Instructions (Addendum)
It was wonderful to see you today.  Please bring ALL of your medications with you to every visit.   Today we talked about:  -Please come back tomorrow for a nurse visit at 930 AM for a repeat EKG  -Follow up with me in 1 month    Thank you for choosing [facility].   Please call [phone] with any questions about today's appointment.  Please be sure to schedule follow up at the front  desk before you leave today.   Sharion Settler, DO PGY-3 Family Medicine

## 2022-01-06 ENCOUNTER — Ambulatory Visit (HOSPITAL_COMMUNITY): Payer: 59 | Attending: Family Medicine

## 2022-01-06 ENCOUNTER — Other Ambulatory Visit (HOSPITAL_COMMUNITY): Payer: Self-pay

## 2022-01-06 ENCOUNTER — Ambulatory Visit: Payer: 59 | Admitting: Family Medicine

## 2022-01-06 ENCOUNTER — Encounter: Payer: Self-pay | Admitting: Family Medicine

## 2022-01-06 VITALS — BP 112/62 | HR 54 | Wt 233.0 lb

## 2022-01-06 DIAGNOSIS — I493 Ventricular premature depolarization: Secondary | ICD-10-CM | POA: Diagnosis not present

## 2022-01-06 DIAGNOSIS — I499 Cardiac arrhythmia, unspecified: Secondary | ICD-10-CM | POA: Diagnosis not present

## 2022-01-06 DIAGNOSIS — R634 Abnormal weight loss: Secondary | ICD-10-CM

## 2022-01-06 NOTE — Assessment & Plan Note (Signed)
Has lost an additional 8 pounds since her visit last month. Question if the Trulicity is the cause of this, despite it not being a new medication. She otherwise feels well and has no B symptoms. Recommend patient to discuss with Endocrinologist on thoughts regarding this; could trial off the medication and see what happens with weight?  -F/u in 1 month for re-evaluation with me

## 2022-01-06 NOTE — Assessment & Plan Note (Signed)
Patient's initial pulse was bradycardic, very unusual for her as she is normally tachycardic. Her pulse seemed irregular on manual check which is why EKG was ordered. There was artifact with the EKG as patient had lotion on her chest. 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 The PVC burden seems high with 5 in 6-second EKG strip. Patient is asymptomatic. I reviewed her Echo from May 2022 which showed an EF of 50%, no regional wall abnormalities, no mitral or aortic regurgitation.   -Patient to return for RN visit tomorrow, 7/26 for repeat EKG -Will do BMP and Mg tomorrow as well to assess for electrolyte abnormalities -If PVC's persist, will refer to cardiology -She likely needs to restart her Metoprolol (reported this was stopped, unclear why)

## 2022-01-07 ENCOUNTER — Ambulatory Visit: Payer: 59

## 2022-01-07 ENCOUNTER — Other Ambulatory Visit: Payer: 59

## 2022-01-07 DIAGNOSIS — I493 Ventricular premature depolarization: Secondary | ICD-10-CM

## 2022-01-07 NOTE — Progress Notes (Signed)
Patient reports to nurse clinic for repeat EKG from 7/25. EKG successful.  Precepted with Owens Shark who recommended a referral to Cardiology.   Patient dropped off at the lab.   I have placed hard copy of EKG in [place] box for reviewing.  Please place in scanning afterwards.

## 2022-01-08 ENCOUNTER — Other Ambulatory Visit: Payer: Self-pay | Admitting: Family Medicine

## 2022-01-08 DIAGNOSIS — I493 Ventricular premature depolarization: Secondary | ICD-10-CM

## 2022-01-08 LAB — BASIC METABOLIC PANEL
BUN/Creatinine Ratio: 17 (ref 12–28)
BUN: 10 mg/dL (ref 8–27)
CO2: 22 mmol/L (ref 20–29)
Calcium: 10.4 mg/dL — ABNORMAL HIGH (ref 8.7–10.3)
Chloride: 103 mmol/L (ref 96–106)
Creatinine, Ser: 0.59 mg/dL (ref 0.57–1.00)
Glucose: 132 mg/dL — ABNORMAL HIGH (ref 70–99)
Potassium: 4.6 mmol/L (ref 3.5–5.2)
Sodium: 142 mmol/L (ref 134–144)
eGFR: 101 mL/min/{1.73_m2} (ref >59)

## 2022-01-08 LAB — MAGNESIUM: Magnesium: 2 mg/dL (ref 1.6–2.3)

## 2022-01-13 ENCOUNTER — Other Ambulatory Visit (HOSPITAL_COMMUNITY): Payer: Self-pay

## 2022-01-13 ENCOUNTER — Other Ambulatory Visit: Payer: Self-pay | Admitting: Family Medicine

## 2022-01-13 MED ORDER — METOPROLOL TARTRATE 25 MG PO TABS
12.5000 mg | ORAL_TABLET | Freq: Two times a day (BID) | ORAL | 1 refills | Status: DC
  Filled 2022-01-13: qty 90, 90d supply, fill #0

## 2022-01-13 NOTE — Progress Notes (Signed)
Called patient as she had not yet seen MyChart results. Discussed blood work and EKG. Discussed that I have placed cardiology referral- she has this appointment scheduled already.   Offered to retrial Metoprolol. She says that in the past it caused her to feel sleepy but she was also working nights at that time. She is amenable to retrial of Metoprolol and will stop if she has any adverse effects.

## 2022-01-15 ENCOUNTER — Other Ambulatory Visit (HOSPITAL_COMMUNITY): Payer: Self-pay

## 2022-01-16 ENCOUNTER — Other Ambulatory Visit (HOSPITAL_COMMUNITY): Payer: Self-pay

## 2022-01-17 ENCOUNTER — Other Ambulatory Visit: Payer: Self-pay | Admitting: Internal Medicine

## 2022-01-17 ENCOUNTER — Other Ambulatory Visit (HOSPITAL_COMMUNITY): Payer: Self-pay

## 2022-01-19 ENCOUNTER — Other Ambulatory Visit (HOSPITAL_COMMUNITY): Payer: Self-pay

## 2022-01-19 ENCOUNTER — Ambulatory Visit: Payer: 59 | Admitting: Internal Medicine

## 2022-01-19 ENCOUNTER — Encounter: Payer: Self-pay | Admitting: Internal Medicine

## 2022-01-19 VITALS — BP 124/80 | HR 99 | Ht 67.0 in | Wt 234.0 lb

## 2022-01-19 DIAGNOSIS — E669 Obesity, unspecified: Secondary | ICD-10-CM

## 2022-01-19 DIAGNOSIS — E113291 Type 2 diabetes mellitus with mild nonproliferative diabetic retinopathy without macular edema, right eye: Secondary | ICD-10-CM

## 2022-01-19 DIAGNOSIS — E785 Hyperlipidemia, unspecified: Secondary | ICD-10-CM

## 2022-01-19 DIAGNOSIS — Z794 Long term (current) use of insulin: Secondary | ICD-10-CM | POA: Diagnosis not present

## 2022-01-19 LAB — MICROALBUMIN / CREATININE URINE RATIO
Creatinine,U: 60 mg/dL
Microalb Creat Ratio: 1.2 mg/g (ref 0.0–30.0)
Microalb, Ur: <0.7 mg/dL (ref 0.0–1.9)

## 2022-01-19 LAB — POCT GLYCOSYLATED HEMOGLOBIN (HGB A1C): Hemoglobin A1C: 6.2 % — AB (ref 4.0–5.6)

## 2022-01-19 LAB — LIPID PANEL
Cholesterol: 124 mg/dL (ref 0–200)
HDL: 47.9 mg/dL (ref >39.00)
LDL Cholesterol: 51 mg/dL (ref 0–99)
NonHDL: 75.97
Total CHOL/HDL Ratio: 3
Triglycerides: 123 mg/dL (ref 0.0–149.0)
VLDL: 24.6 mg/dL (ref 0.0–40.0)

## 2022-01-19 MED ORDER — TRULICITY 3 MG/0.5ML ~~LOC~~ SOAJ
3.0000 mg | SUBCUTANEOUS | 2 refills | Status: DC
  Filled 2022-01-19: qty 2, 28d supply, fill #0
  Filled 2022-02-26: qty 2, 28d supply, fill #1
  Filled 2022-03-30: qty 2, 28d supply, fill #2

## 2022-01-19 NOTE — Progress Notes (Signed)
Patient ID: Sarah Walls, female   DOB: 1961/03/19, 61 y.o.   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 Last visit 4 months ago.  Interim history: No increased urination, blurry vision, chest pain. She has a history of nausea - has Meniere's ds. >> improved. She was found to have PVC's >> started Metoprolol. She improved her diet >> almost no sugar. Her appetite decreased.  She lost >30 pounds since last visit.  Sugars also improved.  Reviewed HbA1c levels: Lab Results  Component Value Date   HGBA1C 7.8 (A) 09/15/2021   HGBA1C 8.2 (A) 04/02/2021   HGBA1C 7.9 (A) 12/17/2020   HGBA1C 7.8 (A) 08/16/2020   HGBA1C 8.9 (A) 02/13/2020   HGBA1C 8.8 (A) 03/21/2019   HGBA1C 10.1 (A) 08/10/2018   HGBA1C 8.1 (A) 03/04/2018   HGBA1C 8.3 10/29/2017   HGBA1C 9.4 07/23/2017   She is on: - Metformin 2000 mg with dinner >> ER 2000 mg with dinner >>  in am >> 1000 mg 2x a day - but forgetting it at night >> 2000 mg with breakfast -  >> stopped - Jardiance 25 mg before breakfast - Trulicity 1.5 >> 3 mg weekly in a.m.- restarted 01/2020 - Toujeo 60 units in a.m.>> restarted 20-40 units in a.m. 01/2020 >> 36-40 >> 40 >> 30 units daily Unfortunately, 3 weeks before last visit, she came off Jardiance as she could not refill it We stopped Januvia when we started Trulicity.  Pt checks her sugars >4x a day - Dexcom ([place], was not able to obtain it from [place]):  Previously:    Lowest sugar was 80 >> 88 >>80s; she has hypoglycemia awareness in the 70s. Highest sugar was 276 >> 170 >> 180  Glucometer: AccuChek  Pt's meals are: - Breakfast: b'fast sandwich + yoghurt; boiled eggs + slice of bacon + yoghurt - Lunch: frozen meal +/- salad - Dinner: frozen meals - Snacks: zucchini bread, apple  -No CKD, last BUN/creatinine:  Lab Results  Component Value Date    BUN 10 01/07/2022   BUN 12 11/06/2021   CREATININE 0.59 01/07/2022   CREATININE 0.70 11/06/2021  On lisinopril.  -+ HL; last set of lipids: Lab Results  Component Value Date   CHOL 181 08/23/2020   HDL 58 08/23/2020   LDLCALC 93 08/23/2020   LDLDIRECT 140.1 10/06/2011   TRIG 177 (H) 08/23/2020   CHOLHDL 3.1 08/23/2020  On Lipitor 40.  - last eye exam was in fall 2022: No DR, previously + mild DR; she has a history of cataract surgery in 2016.  - no numbness and tingling in her feet.  Last foot exam 09/2021.  She had a 2dECHO 10/2020 >> EF 50%  - checked 2/2 chronic tachycardia.  ROS: + See HPI  I reviewed pt's medications, allergies, PMH, social hx, family hx, and changes were documented in the history of present illness. Otherwise, unchanged from my initial visit note.  Past Medical History:  Diagnosis Date   AC joint pain 08/13/2011   Allergy    Arthritis    Back pain 11/20/2010   Bipolar disorder ([place])    Cataract    Depression    BAD2 - Dr. Albertine Patricia   Diabetes mellitus without complication ([place])    Hyperlipidemia    Hypertension    Panic attacks    BAD2 - Dr. 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    Social History Main Topics   Smoking status: Never Smoker   Smokeless tobacco: Never Used   Alcohol use No   Drug use: No   Social History Narrative   05/2009 Masters in counseling in disabled.   One disabled  child, has a rare chromosome deletion disorder (decreased mental ability).       Mother lived in home.  (Deceased quickly Spring 09 from metastatic melanoma)   Current Outpatient Medications on File Prior to Visit  Medication Sig Dispense Refill   ACCU-CHEK FASTCLIX LANCETS MISC Check blood 1 - 2 times daily and as directed. Dx E11.65 200 each 1   ACCU-CHEK GUIDE test strip CHECK BLOOD 1 - 2 TIMES DAILY AND AS DIRECTED. DX E11.65 200 strip 1   ALPRAZolam (XANAX) 0.5 MG tablet Take 0.5 mg by mouth at bedtime as needed (for panic attacks.). May take up to 4 tablets per day as needed (Patient not taking: Reported on 11/06/2021)     ALPRAZolam (XANAX) 1 MG tablet Take 1 tablet (1 mg total) by mouth daily as needed for severe axiety/ panic (Patient not taking: Reported on 11/06/2021) 30 tablet 1   aspirin 81 MG tablet Take 81 mg by mouth daily.     atorvastatin (LIPITOR) 80 MG tablet Take 1 tablet (80 mg total) by mouth daily at 6 PM. 90 tablet 3   buPROPion (WELLBUTRIN XL) 300 MG 24 hr tablet Take 1 tablet by mouth upon waking daily. 90 tablet 1   buPROPion (WELLBUTRIN XL) 300 MG 24 hr tablet Take 1 tablet by mouth daily upon wakening 90 tablet 0   buPROPion (WELLBUTRIN XL) 300 MG 24 hr tablet Take 1 tablet by mouth daily upon wakening 90 tablet 0   chlorhexidine (PERIDEX) 0.12 % solution   3   Continuous Blood Gluc Receiver (DEXCOM G6 RECEIVER) DEVI 1 Device by Does not apply route once for 1 dose. 1 each 0   Continuous Blood Gluc Sensor (DEXCOM G6 SENSOR) MISC Inject 1 Device into the skin continuous for 10 days. 9 each 3   Continuous Blood Gluc Transmit (DEXCOM G6 TRANSMITTER) MISC 1 Device by Does not apply route every 3 (three) months. 1 each 3   Dulaglutide (TRULICITY) 3 TR/1.7BV SOPN Inject 3 mg into the skin once a week. 2 mL 5   empagliflozin (JARDIANCE) 25 MG TABS tablet Take 1 tablet (25 mg total) by mouth daily. 90 tablet 3   insulin glargine, 2 Unit Dial, (TOUJEO MAX SOLOSTAR) 300 UNIT/ML  Solostar Pen Inject 40 Units into the skin daily. 9 mL 3   Insulin Pen Needle (UNIFINE PENTIPS) 31G X 5 MM MISC USE DAILY WITH INSULIN PEN 100 each 3   lisinopril (ZESTRIL) 10 MG tablet Take 1 tablet (10 mg total) by mouth at bedtime.  90 tablet 1   lurasidone (LATUDA) 20 MG TABS tablet Take 1 tablet (20 mg total) by mouth every evening with food 30 tablet 2   lurasidone (LATUDA) 20 MG TABS tablet Take 1 tablet by mouth each evening with food. 30 tablet 5   metFORMIN (GLUCOPHAGE-XR) 500 MG 24 hr tablet Take 4 tablets (2,000 mg total) by mouth daily with supper. 360 tablet 0   metoprolol tartrate (LOPRESSOR) 25 MG tablet Take 0.5 tablets (12.5 mg total) by mouth 2 (two) times daily. 45 tablet 1   Multiple Vitamin (MULTIVITAMIN) tablet Take 1 tablet by mouth daily.     ondansetron (ZOFRAN ODT) 4 MG disintegrating tablet Take 1 tablet (4 mg total) by mouth every 8 (eight) hours as needed for nausea or vomiting. 20 tablet 0   terbinafine (LAMISIL) 1 % cream Apply 1 application topically 2 (two) times daily. (Patient not taking: Reported on 11/06/2021) 30 g 1   No current facility-administered medications on file prior to visit.   Allergies  Allergen Reactions   Beta Adrenergic Blockers Other (See Comments)    Drowsy, fatigue   Family History  Problem Relation Age of Onset   Cancer Mother        Metastatic liver CA   Thyroid disease Mother        Hypothyroidism   Vision loss Mother        Eye tumor, removed orbit at the end of 2004   Melanoma Mother    Diabetes Mother    Diabetes Father    Heart disease Father        CABG, coronary disease   Thyroid disease Sister        Hypothyroidism   Colon cancer Maternal Aunt 74   Breast cancer Neg Hx    Colon polyps Neg Hx    Esophageal cancer Neg Hx    Rectal cancer Neg Hx    Stomach cancer Neg Hx    PE: BP 124/80 (BP Location: Left Arm, Patient Position: Sitting, Cuff Size: Small)   Pulse 99   Ht '5\' 7"'$  (1.702 m)   Wt 234 lb (106.1 kg)    LMP 08/28/2015 (Approximate)   SpO2 96%   BMI 36.65 kg/m  Wt Readings from Last 3 Encounters:  01/19/22 234 lb (106.1 kg)  01/06/22 233 lb (105.7 kg)  12/05/21 241 lb (109.3 kg)   Constitutional: overweight, in NAD Eyes: EOMI, no exophthalmos ENT: moist mucous membranes, no thyromegaly, no cervical lymphadenopathy Cardiovascular: Tachycardia, RR, No MRG Respiratory: CTA B Musculoskeletal: no deformities Skin: moist, warm, no rashes Neurological: no tremor with outstretched hands  ASSESSMENT: 1. 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 Obesity class 2  3. HL  PLAN:  1. Patient with longstanding, uncontrolled, type 2 diabetes, on oral antidiabetic regimen with metformin, sulfonylurea, and SGLT2 inhibitor, and also long-acting daily insulin and weekly GLP-1 receptor agonist, with still suboptimal control.  At last visit, HbA1c was slightly better, but still above target, at 7.8%.  Sugars are higher in the previous 2 weeks compared to prior and were increasing after an upper respiratory infection and also after eating out more.  They were higher after breakfast and remained high throughout the day and they were even slightly higher after dinner.  She was forgetting metformin at night and I advised her to move the entire dose with breakfast.  I also advised her to add 5 mg of glipizide before dinner as she was forgetting this. CGM interpretation: -At  today's visit, we reviewed her CGM downloads: It appears that 98% of values are in target range (goal >70%), while 2% are higher than 180 (goal <25%), and 0% are lower than 70 (goal <4%).  The calculated average blood sugar is 127.  The projected HbA1c for the next 3 months (GMI) is 6.3%. -Reviewing the CGM trends, sugars are almost all at goal! -At this point, she is she plans to continue on the reduced sugar diet with only introducing some good-quality carbs, we can continue with the same oral medication and also  weekly GLP-1 receptor agonist.  However, as the sugars are trending down during the night, I advised her to reduce the Toujeo dose. - I suggested to:  Patient Instructions  Please continue: - Metformin 2000 mg in am  - Jardiance 25 mg before breakfast - Trulicity 3 mg weekly  Please decrease: - Toujeo 20 units at bedtime  Please return in 4 months.   - we checked her HbA1c: 6.2% (lower) - advised to check sugars at different times of the day - 4x a day, rotating check times - advised for yearly eye exams >> she is UTD - return to clinic in 4 months  2. Obesity class 2 -She had great success on a plant-based diet in the past but she cannot continue.  We discussed about a whole 30 diet and weight watchers in the past. -continue SGLT 2 inhibitor and GLP-1 receptor agonist which should also help with weight loss -She gained 1 pound before last visit -She lost 31 pounds since last visit - intentional!  3. HL -Reviewed latest lipid panel from 08/2020: LDL higher than our goal of less than 70, triglycerides slightly high: Lab Results  Component Value Date   CHOL 181 08/23/2020   HDL 58 08/23/2020   LDLCALC 93 08/23/2020   LDLDIRECT 140.1 10/06/2011   TRIG 177 (H) 08/23/2020   CHOLHDL 3.1 08/23/2020  -she continues on Lipitor 40 mg daily without side effects  Component     Latest Ref Rng 01/19/2022  Hemoglobin A1C     4.0 - 5.6 % 6.2 !   Cholesterol     0 - 200 mg/dL 124   Triglycerides     0.0 - 149.0 mg/dL 123.0   HDL Cholesterol     >39.00 mg/dL 47.90   VLDL     0.0 - 40.0 mg/dL 24.6   LDL (calc)     0 - 99 mg/dL 51   Total CHOL/HDL Ratio 3   NonHDL 75.97   Microalb, Ur     0.0 - 1.9 mg/dL <0.7   Creatinine,U     mg/dL 60.0   MICROALB/CREAT RATIO     0.0 - 30.0 mg/g 1.2     Excellent results.  Philemon Kingdom, MD PhD [facility]

## 2022-01-19 NOTE — Patient Instructions (Signed)
Please continue: - Metformin 2000 mg in am  - Jardiance 25 mg before breakfast - Trulicity 3 mg weekly  Please decrease: - Toujeo 20 units at bedtime  Please return in 4 months.

## 2022-01-20 ENCOUNTER — Other Ambulatory Visit (HOSPITAL_COMMUNITY): Payer: Self-pay

## 2022-01-20 MED ORDER — INSULIN DEGLUDEC 100 UNIT/ML ~~LOC~~ SOPN
PEN_INJECTOR | SUBCUTANEOUS | 3 refills | Status: DC
  Filled 2022-01-20: qty 18, 90d supply, fill #0

## 2022-01-23 ENCOUNTER — Other Ambulatory Visit (HOSPITAL_COMMUNITY): Payer: Self-pay

## 2022-01-31 DIAGNOSIS — E119 Type 2 diabetes mellitus without complications: Secondary | ICD-10-CM | POA: Diagnosis not present

## 2022-01-31 DIAGNOSIS — Z794 Long term (current) use of insulin: Secondary | ICD-10-CM | POA: Diagnosis not present

## 2022-02-03 ENCOUNTER — Encounter: Payer: Self-pay | Admitting: Cardiovascular Disease

## 2022-02-03 DIAGNOSIS — Z794 Long term (current) use of insulin: Secondary | ICD-10-CM | POA: Diagnosis not present

## 2022-02-03 DIAGNOSIS — E119 Type 2 diabetes mellitus without complications: Secondary | ICD-10-CM | POA: Diagnosis not present

## 2022-02-03 NOTE — Progress Notes (Unsigned)
Cardiology Office Note:    Date:  02/04/2022   ID:  Sarah Walls, DOB 07-06-1960, MRN [id number]  PCP:  Sabino Dick, DO    [facility] Providers Cardiologist:  Jesenia Spera  Click to update primary MD,subspecialty MD or APP then REFRESH:1}    Referring MD: Alicia Amel, MD   Chief Complaint  Patient presents with   Palpitations    History of Present Illness: Aug. 23, 2023   Sarah Walls is a 61 y.o. female with a hx of palpitations, HLD,  DM  Has had DM for 13 years   3 months ago,  started unexpected weight loss  Lost 30 lbs without trying  Has seen her primary MD several times   Got a Dexcon 6 glucose control system  Thinks she may be eating better   ECG showed some PVCs We referred her for further evaluation   Echo from May, 2022 shows low normal EF at 50% Diastolic function was indeterminate.  No significant valvular disease.  Father had CAD at age 23, had CABG . He also had diabetes   Works for [employer],  Journalist, newspaper     Past Medical History:  Diagnosis Date   AC joint pain 08/13/2011   Allergy    Arthritis    Back pain 11/20/2010   Bipolar disorder ([facility])    Cataract    Depression    BAD2 - Dr. Madaline Guthrie   Diabetes mellitus without complication ([facility])    Hyperlipidemia    Hypertension    Panic attacks    BAD2 - Dr. Madaline Guthrie   Personal history of colonic polyps - adenomas 10/13/2013   12/2013 - 6 adenomas max 12 mm - repeat colonoscopy  2018    Tachycardia    Tuberculosis    positive PPD    Past Surgical History:  Procedure Laterality Date   CATARACT EXTRACTION Bilateral 2015   CESAREAN SECTION  1990   Fetal Distress   COLONOSCOPY  06/25/2017   Leone Payor   EYE SURGERY     POLYPECTOMY     TONSILLECTOMY  1969    Current Medications: Current Meds  Medication Sig   ACCU-CHEK FASTCLIX LANCETS MISC Check blood 1 - 2 times daily and as directed. Dx E11.65   ACCU-CHEK GUIDE test strip CHECK BLOOD 1 - 2 TIMES DAILY AND AS DIRECTED.  DX E11.65   ALPRAZolam (XANAX) 1 MG tablet Take 1 tablet (1 mg total) by mouth daily as needed for severe axiety/ panic   aspirin 81 MG tablet Take 81 mg by mouth daily.   atorvastatin (LIPITOR) 80 MG tablet Take 1 tablet (80 mg total) by mouth daily at 6 PM.   buPROPion (WELLBUTRIN XL) 300 MG 24 hr tablet Take 1 tablet by mouth upon waking daily.   chlorhexidine (PERIDEX) 0.12 % solution    Continuous Blood Gluc Receiver (DEXCOM G6 RECEIVER) DEVI 1 Device by Does not apply route once for 1 dose.   Continuous Blood Gluc Sensor (DEXCOM G6 SENSOR) MISC Inject 1 Device into the skin continuous for 10 days.   Continuous Blood Gluc Transmit (DEXCOM G6 TRANSMITTER) MISC 1 Device by Does not apply route every 3 (three) months.   Dulaglutide (TRULICITY) 3 MG/0.5ML SOPN Inject 3 mg into the skin once a week.   empagliflozin (JARDIANCE) 25 MG TABS tablet Take 1 tablet (25 mg total) by mouth daily.   insulin glargine, 2 Unit Dial, (TOUJEO MAX SOLOSTAR) 300 UNIT/ML Solostar Pen Inject 40 Units into the skin daily. (  Patient taking differently: Inject 20 Units into the skin daily.)   Insulin Pen Needle (UNIFINE PENTIPS) 31G X 5 MM MISC USE DAILY WITH INSULIN PEN   lisinopril (ZESTRIL) 10 MG tablet Take 1 tablet (10 mg total) by mouth at bedtime.   lurasidone (LATUDA) 20 MG TABS tablet Take 1 tablet (20 mg total) by mouth every evening with food   metFORMIN (GLUCOPHAGE-XR) 500 MG 24 hr tablet Take 4 tablets (2,000 mg total) by mouth daily with supper.   metoprolol tartrate (LOPRESSOR) 25 MG tablet Take 1 tablet by mouth 2 times daily.   Multiple Vitamin (MULTIVITAMIN) tablet Take 1 tablet by mouth daily.   ondansetron (ZOFRAN ODT) 4 MG disintegrating tablet Take 1 tablet (4 mg total) by mouth every 8 (eight) hours as needed for nausea or vomiting.   terbinafine (LAMISIL) 1 % cream Apply 1 application topically 2 (two) times daily.   [DISCONTINUED] ALPRAZolam (XANAX) 0.5 MG tablet Take 0.5 mg by mouth at bedtime  as needed (for panic attacks.). May take up to 4 tablets per day as needed   [DISCONTINUED] insulin degludec (TRESIBA) 100 UNIT/ML FlexTouch Pen Inject 20 units into the skin daily.   [DISCONTINUED] lurasidone (LATUDA) 20 MG TABS tablet Take 1 tablet by mouth each evening with food.   [DISCONTINUED] metoprolol tartrate (LOPRESSOR) 25 MG tablet Take 0.5 tablets (12.5 mg total) by mouth 2 (two) times daily.     Allergies:   Beta adrenergic blockers   Social History   Socioeconomic History   Marital status: Married    Spouse name: Not on file   Number of children: 1   Years of education: Not on file   Highest education level: Not on file  Occupational History   Occupation: Freight forwarder: UNEMPLOYED  Tobacco Use   Smoking status: Never    Passive exposure: Never   Smokeless tobacco: Never  Vaping Use   Vaping Use: Never used  Substance and Sexual Activity   Alcohol use: No    Alcohol/week: 0.0 standard drinks of alcohol   Drug use: No   Sexual activity: Not Currently    Birth control/protection: None  Other Topics Concern   Not on file  Social History Narrative   05/2009 Masters in counseling in disabled.   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       Mother lived in home.  (Deceased quickly Spring 09 from metastatic melanoma)   Social Determinants of Health   Financial Resource Strain: Not on file  Food Insecurity: Not on file  Transportation Needs: Not on file  Physical Activity: Not on file  Stress: Not on file  Social Connections: Not on file     Family History: The patient's family history includes Cancer in her mother; Colon cancer (age of onset: 31) in her maternal aunt; Diabetes in her father and mother; Heart disease in her father; Melanoma in her mother; Thyroid disease in her mother and sister; Vision loss in her mother. There is no history of Breast cancer, Colon polyps, Esophageal cancer,  Rectal cancer, or Stomach cancer.  ROS:   Please see the history of present illness.     All other systems reviewed and are negative.  EKGs/Labs/Other Studies Reviewed:    The following studies were reviewed today:   EKG:  January 07, 2022.   Sinus tach at 103.  Frequent PVCs    Recent Labs: 11/06/2021: ALT 32; TSH 1.100 12/05/2021: Hemoglobin  15.7; Platelets 270 01/07/2022: BUN 10; Creatinine, Ser 0.59; Magnesium 2.0; Potassium 4.6; Sodium 142  Recent Lipid Panel    Component Value Date/Time   CHOL 124 01/19/2022 0846   CHOL 181 08/23/2020 0937   TRIG 123.0 01/19/2022 0846   HDL 47.90 01/19/2022 0846   HDL 58 08/23/2020 0937   CHOLHDL 3 01/19/2022 0846   VLDL 24.6 01/19/2022 0846   LDLCALC 51 01/19/2022 0846   LDLCALC 93 08/23/2020 0937   LDLDIRECT 140.1 10/06/2011 0948     Risk Assessment/Calculations:                Physical Exam:    VS:  BP 128/72   Pulse 98   Ht 5\' 7"  (1.702 m)   Wt 234 lb (106.1 kg)   LMP 08/28/2015 (Approximate)   SpO2 96%   BMI 36.65 kg/m     Wt Readings from Last 3 Encounters:  02/04/22 234 lb (106.1 kg)  01/19/22 234 lb (106.1 kg)  01/06/22 233 lb (105.7 kg)     GEN: moderately obese, well developed in no acute distress HEENT: Normal NECK: No JVD; No carotid bruits LYMPHATICS: No lymphadenopathy CARDIAC: RRR, no murmurs, rubs, gallops RESPIRATORY:  Clear to auscultation without rales, wheezing or rhonchi  ABDOMEN: Soft, non-tender, non-distended MUSCULOSKELETAL:  No edema; No deformity  SKIN: Warm and dry NEUROLOGIC:  Alert and oriented x 3 PSYCHIATRIC:  Normal affect   ASSESSMENT:    1. PVC (premature ventricular contraction)   2. Mixed hyperlipidemia    PLAN:       Premature ventricular contractions: Sarah Walls presents for further evaluation and management of her premature ventricular contractions.  These were documented on an EKG on July 26.  She was started on metoprolol and her symptoms have resolved.  Her TSH is  normal.  Her glucose levels have been well controlled. Her heart rate is still a little bit on the fast side.  We will increase the metoprolol to 25 mg twice a day. She had a echocardiogram last year which revealed an LVEF of 50%.  It was otherwise fairly normal.  At this point I do not think that she needs a monitor.  The metoprolol seems to have controlled the premature ventricular contractions.  2.  Mildly depressed LV systolic function: She is completely asymptomatic.  She is on Jardiance, metoprolol, lisinopril.  Blood pressure is well controlled.  She seems to be very stable.  3.  Diabetes mellitus: Her diabetes seems to be much better controlled now that she is using the Dexcom  6.       Medication Adjustments/Labs and Tests Ordered: Current medicines are reviewed at length with the patient today.  Concerns regarding medicines are outlined above.  Orders Placed This Encounter  Procedures   CT CARDIAC SCORING (SELF PAY ONLY)   Meds ordered this encounter  Medications   metoprolol tartrate (LOPRESSOR) 25 MG tablet    Sig: Take 1 tablet by mouth 2 times daily.    Dispense:  180 tablet    Refill:  3    Patient Instructions  Medication Instructions:  Your physician has recommended you make the following change in your medication:  Metoprolol 25mg  BID  *If you need a refill on your cardiac medications before your next appointment, please call your pharmacy*   Testing/Procedures: Your physician has requested that you have cardiac CT. Cardiac computed tomography (CT) is a painless test that uses an x-ray machine to take clear, detailed pictures of your heart. For further  information please visit [email or web address] Please follow instruction sheet as given.    Follow-Up: At [facility], you and your health needs are our priority.  As part of our continuing mission to provide you with exceptional heart care, we have created designated Provider Care Teams.  These Care Teams  include your primary Cardiologist (physician) and Advanced Practice Providers (APPs -  Physician Assistants and Nurse Practitioners) who all work together to provide you with the care you need, when you need it.  We recommend signing up for the patient portal called "MyChart".  Sign up information is provided on this After Visit Summary.  MyChart is used to connect with patients for Virtual Visits (Telemedicine).  Patients are able to view lab/test results, encounter notes, upcoming appointments, etc.  Non-urgent messages can be sent to your provider as well.   To learn more about what you can do with MyChart, go to [email or web address].    Your next appointment:   1 year  The format for your next appointment:   In Person  Provider:   Dr. Elease Hashimoto          Signed, Kristeen Miss, MD  02/04/2022 10:44 AM    [facility]

## 2022-02-04 ENCOUNTER — Ambulatory Visit: Payer: 59 | Admitting: Cardiovascular Disease

## 2022-02-04 ENCOUNTER — Other Ambulatory Visit (HOSPITAL_COMMUNITY): Payer: Self-pay

## 2022-02-04 ENCOUNTER — Encounter: Payer: Self-pay | Admitting: Cardiovascular Disease

## 2022-02-04 VITALS — BP 128/72 | HR 98 | Ht 67.0 in | Wt 234.0 lb

## 2022-02-04 DIAGNOSIS — E782 Mixed hyperlipidemia: Secondary | ICD-10-CM | POA: Diagnosis not present

## 2022-02-04 DIAGNOSIS — I493 Ventricular premature depolarization: Secondary | ICD-10-CM | POA: Diagnosis not present

## 2022-02-04 MED ORDER — METOPROLOL TARTRATE 25 MG PO TABS
25.0000 mg | ORAL_TABLET | Freq: Two times a day (BID) | ORAL | 3 refills | Status: DC
  Filled 2022-02-04 – 2022-04-11 (×2): qty 180, 90d supply, fill #0
  Filled 2022-08-03: qty 180, 90d supply, fill #1
  Filled 2022-10-30: qty 180, 90d supply, fill #2

## 2022-02-04 NOTE — Patient Instructions (Addendum)
Medication Instructions:  Your physician has recommended you make the following change in your medication:  Metoprolol '25mg'$  BID  *If you need a refill on your cardiac medications before your next appointment, please call your pharmacy*   Testing/Procedures: Your physician has requested that you have cardiac CT. Cardiac computed tomography (CT) is a painless test that uses an x-ray machine to take clear, detailed pictures of your heart. For further information please visit [email or web address]. Please follow instruction sheet as given.    Follow-Up: At [facility], you and your health needs are our priority.  As part of our continuing mission to provide you with exceptional heart care, we have created designated Provider Care Teams.  These Care Teams include your primary Cardiologist (physician) and Advanced Practice Providers (APPs -  Physician Assistants and Nurse Practitioners) who all work together to provide you with the care you need, when you need it.  We recommend signing up for the patient portal called "MyChart".  Sign up information is provided on this After Visit Summary.  MyChart is used to connect with patients for Virtual Visits (Telemedicine).  Patients are able to view lab/test results, encounter notes, upcoming appointments, etc.  Non-urgent messages can be sent to your provider as well.   To learn more about what you can do with MyChart, go to [email or web address].    Your next appointment:   1 year  The format for your next appointment:   In Person  Provider:   Dr. Acie Fredrickson

## 2022-02-12 NOTE — Patient Instructions (Addendum)
It was wonderful to see you today.  Please bring ALL of your medications with you to every visit.   Today we talked about:  Today at your annual preventive visit we talked about the following measures:  I recommend 150 minutes of exercise per week-try 30 minutes 5 days per week We discussed reducing sugary beverages (like soda and juice) and increasing leafy greens and whole fruits.  We discussed avoiding tobacco and alcohol.  I recommend avoiding illicit substances.  Your blood pressure is at goal of <130/80.   For your weight- keep up the healthy changes! Try to incorporate more exercise!  Come back in 6 months.   Thank you for choosing [facility].   Please call [phone] with any questions about today's appointment.  Please be sure to schedule follow up at the front  desk before you leave today.   Sharion Settler, DO PGY-3 Family Medicine
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
## 2022-02-13 ENCOUNTER — Encounter: Payer: Self-pay | Admitting: Family Medicine

## 2022-02-13 ENCOUNTER — Other Ambulatory Visit (HOSPITAL_COMMUNITY): Payer: Self-pay

## 2022-02-13 ENCOUNTER — Ambulatory Visit: Payer: 59 | Admitting: Family Medicine

## 2022-02-13 VITALS — BP 121/67 | HR 80 | Wt 231.2 lb

## 2022-02-13 DIAGNOSIS — I493 Ventricular premature depolarization: Secondary | ICD-10-CM | POA: Diagnosis not present

## 2022-02-13 DIAGNOSIS — I10 Essential (primary) hypertension: Secondary | ICD-10-CM

## 2022-02-13 DIAGNOSIS — Z23 Encounter for immunization: Secondary | ICD-10-CM

## 2022-02-13 DIAGNOSIS — Z0000 Encounter for general adult medical examination without abnormal findings: Secondary | ICD-10-CM

## 2022-02-13 DIAGNOSIS — R634 Abnormal weight loss: Secondary | ICD-10-CM | POA: Diagnosis not present

## 2022-02-13 DIAGNOSIS — Z13820 Encounter for screening for osteoporosis: Secondary | ICD-10-CM

## 2022-02-13 MED ORDER — ZOSTER VAC RECOMB ADJUVANTED 50 MCG/0.5ML IM SUSR
0.5000 mL | Freq: Once | INTRAMUSCULAR | 1 refills | Status: AC
  Filled 2022-02-13: qty 0.5, 1d supply, fill #0

## 2022-02-13 NOTE — Assessment & Plan Note (Addendum)
At goal. Continue current regimen of lisinopril 10 mg daily, metoprolol 25 mg twice daily. BMP up to date.

## 2022-02-13 NOTE — Assessment & Plan Note (Signed)
Seems to be more intentional at this time. Down 3 lbs since 8/23 but weight otherwise stable this month. No need for close monitoring, can follow-up in 6 months.  Encouraged her to continue healthy lifestyle changes. She is working to incorporate more exercise into her daily routine.

## 2022-02-13 NOTE — Assessment & Plan Note (Signed)
Stable, asymptomatic. HR normal today. Continue Metoprolol 25 mg BID.

## 2022-02-25 ENCOUNTER — Other Ambulatory Visit (HOSPITAL_COMMUNITY): Payer: Self-pay

## 2022-02-25 MED ORDER — LURASIDONE HCL 20 MG PO TABS
20.0000 mg | ORAL_TABLET | Freq: Every evening | ORAL | 2 refills | Status: DC
  Filled 2022-02-25: qty 30, 30d supply, fill #0
  Filled 2022-03-30: qty 30, 30d supply, fill #1
  Filled 2022-05-10: qty 30, 30d supply, fill #2
  Filled 2022-05-11: qty 30, 30d supply, fill #0

## 2022-02-26 ENCOUNTER — Other Ambulatory Visit (HOSPITAL_COMMUNITY): Payer: Self-pay

## 2022-03-02 ENCOUNTER — Other Ambulatory Visit (HOSPITAL_COMMUNITY): Payer: Self-pay

## 2022-03-03 ENCOUNTER — Other Ambulatory Visit (HOSPITAL_COMMUNITY): Payer: Self-pay

## 2022-03-04 ENCOUNTER — Other Ambulatory Visit (HOSPITAL_COMMUNITY): Payer: Self-pay

## 2022-03-06 ENCOUNTER — Ambulatory Visit (HOSPITAL_BASED_OUTPATIENT_CLINIC_OR_DEPARTMENT_OTHER)
Admission: RE | Admit: 2022-03-06 | Discharge: 2022-03-06 | Disposition: A | Discharge status: Home / Self Care | Payer: 59 | Source: Ambulatory Visit | Attending: Cardiovascular Disease | Admitting: Cardiovascular Disease

## 2022-03-06 ENCOUNTER — Other Ambulatory Visit (HOSPITAL_COMMUNITY): Payer: Self-pay

## 2022-03-06 ENCOUNTER — Other Ambulatory Visit: Payer: Self-pay | Admitting: Family Medicine

## 2022-03-06 ENCOUNTER — Ambulatory Visit: Payer: 59 | Admitting: Family Medicine

## 2022-03-06 DIAGNOSIS — E782 Mixed hyperlipidemia: Secondary | ICD-10-CM | POA: Insufficient documentation

## 2022-03-06 DIAGNOSIS — R42 Dizziness and giddiness: Secondary | ICD-10-CM

## 2022-03-06 DIAGNOSIS — I493 Ventricular premature depolarization: Secondary | ICD-10-CM | POA: Insufficient documentation

## 2022-03-06 MED ORDER — ONDANSETRON 4 MG PO TBDP
4.0000 mg | ORAL_TABLET | Freq: Three times a day (TID) | ORAL | 0 refills | Status: DC | PRN
  Filled 2022-03-06: qty 9, 3d supply, fill #0

## 2022-03-10 ENCOUNTER — Other Ambulatory Visit (HOSPITAL_COMMUNITY): Payer: Self-pay

## 2022-03-10 ENCOUNTER — Other Ambulatory Visit: Payer: Self-pay | Admitting: Internal Medicine

## 2022-03-10 MED ORDER — METFORMIN HCL ER 500 MG PO TB24
2000.0000 mg | ORAL_TABLET | Freq: Every day | ORAL | 0 refills | Status: DC
  Filled 2022-03-10: qty 360, 90d supply, fill #0

## 2022-03-12 ENCOUNTER — Other Ambulatory Visit (HOSPITAL_COMMUNITY): Payer: Self-pay

## 2022-03-16 ENCOUNTER — Other Ambulatory Visit (HOSPITAL_COMMUNITY): Payer: Self-pay

## 2022-03-26 ENCOUNTER — Other Ambulatory Visit (HOSPITAL_COMMUNITY): Payer: Self-pay

## 2022-03-30 ENCOUNTER — Other Ambulatory Visit (HOSPITAL_COMMUNITY): Payer: Self-pay

## 2022-03-31 ENCOUNTER — Other Ambulatory Visit (HOSPITAL_COMMUNITY): Payer: Self-pay

## 2022-04-01 ENCOUNTER — Other Ambulatory Visit (HOSPITAL_COMMUNITY): Payer: Self-pay

## 2022-04-02 ENCOUNTER — Ambulatory Visit: Payer: 59 | Admitting: Family Medicine

## 2022-04-02 ENCOUNTER — Other Ambulatory Visit (HOSPITAL_COMMUNITY): Payer: Self-pay

## 2022-04-02 ENCOUNTER — Other Ambulatory Visit: Payer: Self-pay

## 2022-04-02 VITALS — Wt 231.2 lb

## 2022-04-02 DIAGNOSIS — H8109 Meniere's disease, unspecified ear: Secondary | ICD-10-CM | POA: Insufficient documentation

## 2022-04-02 DIAGNOSIS — H8102 Meniere's disease, left ear: Secondary | ICD-10-CM | POA: Diagnosis not present

## 2022-04-02 DIAGNOSIS — H6122 Impacted cerumen, left ear: Secondary | ICD-10-CM | POA: Diagnosis not present

## 2022-04-02 MED ORDER — DEBROX 6.5 % OT SOLN
5.0000 [drp] | Freq: Two times a day (BID) | OTIC | 0 refills | Status: DC
  Filled 2022-04-02 – 2022-06-04 (×3): qty 15, 30d supply, fill #0
  Filled 2022-08-03: qty 15, 15d supply, fill #0

## 2022-04-02 NOTE — Progress Notes (Addendum)
SUBJECTIVE:   CHIEF COMPLAINT / HPI:   Sarah Walls is a 61 y.o. female with a past medical history of longstanding Meniere's disease, PVCs, HTN, and T2DM who presents to the [facility] clinic today to discuss worsening dizziness and associated nausea   Dizziness, nausea The patient has been feeling dizzy more frequently for approximately a month.  She has had baseline dizziness since her diagnosis of Meniere's in 2017, but it has been very mild for the past few years.  She has had one "bad episode" two weeks ago when she threw up at work.  Has felt intermittently dizzy for the past month.  Outside of dizziness episodes, she feels fine.  Prior to the onset of vertigo and nausea symptoms, the patient's left ear feels like it is clogging up.  She has had some recent tinnitus.  During episodes, vertigo is ameliorated by lying down and closing eyes.  She feels stress is a trigger.  Dizziness described as:  Room swinging and spinning Feels like room spins: Yes Exacerbated by head movement: Yes Lightheadedness when stands: Only during episodes Palpitations or heart racing: No Prior dizziness: Yes, previously diagnosed with Meniere's disease. Medications tried: Currently got refill of Zofran. Sudafed decongestant has previously helped and she purchased some again.  Taking blood thinners: No  Symptoms Hearing Loss: Yes Ear Pain or fullness: Yes Nausea or vomiting: Yes Vision difficulty or double vision:  No Falls: No Head trauma: No Weakness in arm or leg: No Speaking problems: No Headache: No  PERTINENT  PMH / PSH:  Patient medications reviewed this visit. Current Meds  Medication Sig   ACCU-CHEK FASTCLIX LANCETS MISC Check blood 1 - 2 times daily and as directed. Dx E11.65   ACCU-CHEK GUIDE test strip CHECK BLOOD 1 - 2 TIMES DAILY AND AS DIRECTED. DX E11.65   ALPRAZolam (XANAX) 1 MG tablet Take 1 tablet (1 mg total) by mouth daily as needed for severe axiety/ panic   atorvastatin  (LIPITOR) 80 MG tablet Take 1 tablet (80 mg total) by mouth daily at 6 PM.   buPROPion (WELLBUTRIN XL) 300 MG 24 hr tablet Take 1 tablet by mouth upon waking daily.   carbamide peroxide (DEBROX) 6.5 % OTIC solution Place 5 drops into the left ear 2 (two) times daily.   Continuous Blood Gluc Transmit (DEXCOM G6 TRANSMITTER) MISC 1 Device by Does not apply route every 3 (three) months.   Dulaglutide (TRULICITY) 3 JQ/7.3AL SOPN Inject 3 mg into the skin once a week.   empagliflozin (JARDIANCE) 25 MG TABS tablet Take 1 tablet (25 mg total) by mouth daily.   insulin glargine, 2 Unit Dial, (TOUJEO MAX SOLOSTAR) 300 UNIT/ML Solostar Pen Inject 40 Units into the skin daily. (Patient taking differently: Inject 20 Units into the skin daily.)   Insulin Pen Needle (UNIFINE PENTIPS) 31G X 5 MM MISC USE DAILY WITH INSULIN PEN   lisinopril (ZESTRIL) 10 MG tablet Take 1 tablet (10 mg total) by mouth at bedtime.   lurasidone (LATUDA) 20 MG TABS tablet Take 1 tablet (20 mg total) by mouth every evening with food   metFORMIN (GLUCOPHAGE-XR) 500 MG 24 hr tablet Take 4 tablets (2,000 mg total) by mouth daily with supper.   metoprolol tartrate (LOPRESSOR) 25 MG tablet Take 1 tablet by mouth 2 times daily.   Multiple Vitamin (MULTIVITAMIN) tablet Take 1 tablet by mouth daily.   ondansetron (ZOFRAN-ODT) 4 MG disintegrating tablet Take 1 tablet (4 mg total) by mouth every 8 (eight)  hours as needed for nausea or vomiting.    OBJECTIVE:   Wt 231 lb 3.2 oz (104.9 kg)   LMP 08/28/2015 (Approximate)   SpO2 97%   BMI 36.21 kg/m    04/02/2022 3:59 AM 04/02/2022 3:55 PM 04/02/2022 3:57 PM  Orthostatic BP 112/74 144/85 114/67  Patient Position Standing Supine Sitting  Orthostatic Pulse 101 96 95    General: Age-appropriate, resting comfortably in chair, NAD, WNWD, alert and at baseline. HEENT:  Head: Normocephalic, atraumatic. No tenderness to percussion over sinuses. Eyes: PERRLA. Sclera without injection or  icterus. Ears: TMs non-bulging and non-erythematous bilaterally. No erythema of external ear canal. Left-sided cerumen impaction noted, obscuring most of left TM. Neck: Supple. No LAD, thyroid smooth. Cardiovascular: Regular rate and rhythm. Normal S1/S2. No murmurs, rubs, or gallops appreciated. 2+ radial pulses. Skin: Warm and dry. Neuro: CNs II-XII intact.  Sensation and strength grossly intact and symmetrical.  Negative Romberg.  No gait antalgia. Extremities: No peripheral edema bilaterally.  HINTS Test: Did not demonstrate bidirectional nystagmus, vertical nystagmus or torsion. There was appropriate rapid eye movement with head impulse in both directions.  ASSESSMENT/PLAN:    1. Meniere's disease of left ear The most likely diagnosis is Menieres disease given her previous history and symptoms with tinnitus and ear fullness. Other differential for dizziness includes peripheral (BPPV, vestibular neuritis, otosclerosis), and central (vestibular migraine, CVA, cerebellopontine angle and posterior fossa meningiomas) causes, as well as other causes such as psychiatric, medication induced, orthostatic, metabolic. I have considered and concluded the patient has a very low likelihood of having: CNS causes (CVA, CN8 tumor, Seizures, MS) Vascular structural disease (vertebral artery or aortic dissection) Cardiac structural disease (valvular, tamponade, myxoma, ischemia,) Arrhythmias, or Adrenal insufficiency. She had a negative HINTS tests which is reassuring against central causes. She did have positive orthostatics which makes would complicate the treatment which typically includes diuretic therapy with HCTZ-triamterene. Recommended referral to ENT for additional management recommendations. Patient was amenable to this.  - Ambulatory referral to ENT - Recommend salt restriction - Recommend that she be careful when changing positions given positive orthostatics, Did not adjust BP meds at this time but  could consider in the future   2. Impacted cerumen of left ear Impacted cerumen in left ear drop.  - carbamide peroxide (DEBROX) 6.5 % OTIC solution; Place 5 drops into the left ear 2 (two) times daily.  Dispense: 15 mL; Refill: 0   Brylinn Teaney Mining engineer, [place]   I was personally present and performed or re-performed the history, physical exam and medical decision making activities of this service and have verified that the service and findings are accurately documented in the student's note.  Sharion Settler, DO                  04/02/2022, 5:34 PM

## 2022-04-02 NOTE — Patient Instructions (Addendum)
It was wonderful to see you today!  Please bring ALL of your medications with you to every visit.   Today we talked about: - We noticed that your blood pressure drops when you stand up from sitting or lying down and we are hesitant to start a fluid pill for your dizziness - Your symptoms are consistent with Meniereare referring you to the ENT specialist to further manage your Meniere's - If you use sudafed for your Meniere's, please be aware that it can raise your blood pressure - For intermittent FMLA, please bring your paperwork to your next appointment for [place] to discuss in more detail - For your earwax, Debrox drops  Thank you for choosing [facility].   Please call [phone] with any questions about today's appointment.  Please be sure to schedule follow up at the front  desk before you leave today.   Sharion Settler, DO PGY-3 Family Medicine

## 2022-04-02 NOTE — Progress Notes (Deleted)
    SUBJECTIVE:   CHIEF COMPLAINT / HPI:   Sarah Walls is a 61 y.o. female who presents to the [facility] clinic today to discuss the following concerns:   Dizziness, Nausea Feeling dizzy for ***  Dizziness described as:  *** Feels like room spins: *** Exacerbated by head movement: *** Lightheadedness when stands: *** Palpitations or heart racing: *** Prior dizziness: *** Medications tried: *** Patient believes might be causing their pain: ***  Taking blood thinners: ***  Symptoms Hearing Loss: *** Ear Pain or fullness: *** Nausea or vomiting: *** Vision difficulty or double vision: *** Falls: *** Head trauma: *** Weakness in arm or leg: *** Speaking problems: *** Headache: ***  PERTINENT  PMH / PSH: HTN, PVC, T2DM    OBJECTIVE:   LMP 08/28/2015 (Approximate)  ***  General: NAD, pleasant, able to participate in exam HEENT:  Respiratory: Normal respiratory effort on room air Abdomen: Bowel sounds present, nontender, nondistended, no hepatosplenomegaly. Neuro: alert, no obvious focal deficits.  No nystagmus present with head impulse test.*** Psych: Normal affect and mood  HINTS Test: Did not demonstrate bidirectional nystagmus, vertical nystagmus or torsion. There was appropriate rapid eye movement with head impulse in both directions.   ASSESSMENT/PLAN:   No problem-specific Assessment & Plan notes found for this encounter.    The most likely diagnosis is ***. Differential for dizziness includes peripheral (BPPV, vestibular neuritis, Mnire's, otosclerosis), and central (vestibular migraine, CVA, cerebellopontine angle and posterior fossa meningiomas) causes, as well as other causes such as psychiatric, medication induced, orthostatic, metabolic.. I have considered and concluded the patient has a very low likelihood of having: CNS causes (CVA, CN8 tumor, Seizures, MS) Vascular structural disease (vertebral artery or aortic dissection) Cardiac structural disease (valvular,  tamponade, myxoma, ischemia,) Arrhythmias, or Adrenal insufficiency.   Sharion Settler, [place]

## 2022-04-08 ENCOUNTER — Telehealth: Payer: Self-pay

## 2022-04-08 NOTE — Telephone Encounter (Signed)
Patient calls nurse line to inform Dr. Nita Sells that [employer] is faxing over [facility] paperwork.   Patient was seen on 04/02/22. Please advise if patient needs additional appointment to discuss paperwork.   Talbot Grumbling, RN

## 2022-04-08 NOTE — Telephone Encounter (Signed)
Patient called and scheduled for appointment on 04/20/22.  Talbot Grumbling, RN

## 2022-04-11 ENCOUNTER — Other Ambulatory Visit (HOSPITAL_COMMUNITY): Payer: Self-pay

## 2022-04-19 ENCOUNTER — Other Ambulatory Visit (HOSPITAL_COMMUNITY): Payer: Self-pay

## 2022-04-20 ENCOUNTER — Encounter: Payer: Self-pay | Admitting: Family Medicine

## 2022-04-20 ENCOUNTER — Ambulatory Visit: Payer: 59 | Admitting: Family Medicine

## 2022-04-20 ENCOUNTER — Other Ambulatory Visit (HOSPITAL_COMMUNITY): Payer: Self-pay

## 2022-04-20 VITALS — BP 132/80 | HR 107 | Ht 67.0 in | Wt 229.4 lb

## 2022-04-20 DIAGNOSIS — H8102 Meniere's disease, left ear: Secondary | ICD-10-CM

## 2022-04-20 NOTE — Assessment & Plan Note (Signed)
FMLA paperwork filled out today- will allow for 2 flares a month with an estimated duration of 1.5 days each. She is typically able to work through her symptoms but does have worse flares intermittently in which she cannot do her job functions. Referral to ENT placed at last visit. Have messaged our referral coordinator to check in on status of referral. Her symptoms currently seem to be relatively controlled with Zofran. Diuretic therapy not initiated last visit given her positive orthostatics. Patient will check in as needed.

## 2022-04-20 NOTE — Progress Notes (Signed)
    SUBJECTIVE:   CHIEF COMPLAINT / HPI:   Sarah Walls is a 61 y.o. female who presents to the [facility] clinic today to discuss the following concerns:   FMLA Form Completion Patient presents today for FMLA form completion.  She is currently experiences intermittent flares of her Mnire's disease.  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  She has been taking Zofran about 3 times weekly which seems to help.  She estimates about 1 episode a month for which she has to miss approximately a day and a half of work.  She has needed FMLA for this in the past.  A referral to ENT was placed at her last visit in October.  Unfortunately she has not yet heard back from the ENT office.   PERTINENT  PMH / PSH: Meniere's   OBJECTIVE:   BP 132/80   Pulse (!) 107   Ht '5\' 7"'$  (1.702 m)   Wt 229 lb 6.4 oz (104.1 kg)   LMP 08/28/2015 (Approximate)   SpO2 95%   BMI 35.93 kg/m    General: NAD, pleasant, able to participate in exam Respiratory: normal effort Psych: Normal affect and mood  ASSESSMENT/PLAN:   Meniere disease FMLA paperwork filled out today- will allow for 2 flares a month with an estimated duration of 1.5 days each. She is typically able to work through her symptoms but does have worse flares intermittently in which she cannot do her job functions. Referral to ENT placed at last visit. Have messaged our referral coordinator to check in on status of referral. Her symptoms currently seem to be relatively controlled with Zofran. Diuretic therapy not initiated last visit given her positive orthostatics. Patient will check in as needed.     Sharion Settler, [place]

## 2022-04-20 NOTE — Patient Instructions (Addendum)
It was wonderful to see you today.  Please bring ALL of your medications with you to every visit.   Today we talked about:  I have completed your FMLA paperwork, we will get it faxed over!  I messaged our referral coordinator to check on the ENT referral. If you don't hear back in 1 week, send me a MyChart message.  Thank you for coming to your visit as scheduled. We have had a large "no-show" problem lately, and this significantly limits our ability to see and care for patients. As a friendly reminder- if you cannot make your appointment please call to cancel. We do have a no show policy for those who do not cancel within 24 hours. Our policy is that if you miss or fail to cancel an appointment within 24 hours, 3 times in a 89-monthperiod, you may be dismissed from our clinic.   Thank you for choosing [facility]   Please call [phone] any questions about today's appointment.  Please be sure to schedule follow up at the front  desk before you leave today.   ASharion Settler DO PGY-3 Family Medicine

## 2022-04-23 ENCOUNTER — Other Ambulatory Visit: Payer: Self-pay | Admitting: Internal Medicine

## 2022-04-23 ENCOUNTER — Other Ambulatory Visit: Payer: Self-pay | Admitting: Family Medicine

## 2022-04-23 ENCOUNTER — Other Ambulatory Visit (HOSPITAL_COMMUNITY): Payer: Self-pay

## 2022-04-23 DIAGNOSIS — R42 Dizziness and giddiness: Secondary | ICD-10-CM

## 2022-04-23 MED ORDER — TRULICITY 3 MG/0.5ML ~~LOC~~ SOAJ
3.0000 mg | SUBCUTANEOUS | 1 refills | Status: DC
  Filled 2022-04-23: qty 2, 28d supply, fill #0
  Filled 2022-05-21: qty 2, 28d supply, fill #1

## 2022-04-24 ENCOUNTER — Other Ambulatory Visit (HOSPITAL_COMMUNITY): Payer: Self-pay

## 2022-04-24 MED ORDER — ONDANSETRON 4 MG PO TBDP
4.0000 mg | ORAL_TABLET | Freq: Three times a day (TID) | ORAL | 0 refills | Status: DC | PRN
  Filled 2022-04-24: qty 9, 3d supply, fill #0

## 2022-04-27 ENCOUNTER — Other Ambulatory Visit (HOSPITAL_COMMUNITY): Payer: Self-pay

## 2022-04-27 MED ORDER — BUPROPION HCL ER (XL) 300 MG PO TB24
300.0000 mg | ORAL_TABLET | Freq: Every day | ORAL | 0 refills | Status: DC
  Filled 2022-04-27: qty 90, 90d supply, fill #0

## 2022-04-27 MED ORDER — ALPRAZOLAM 1 MG PO TABS
1.0000 mg | ORAL_TABLET | Freq: Every day | ORAL | 1 refills | Status: AC | PRN
  Filled 2022-04-27: qty 30, 30d supply, fill #0
  Filled 2022-06-04: qty 30, 30d supply, fill #1

## 2022-05-01 ENCOUNTER — Telehealth: Payer: Self-pay

## 2022-05-01 NOTE — Telephone Encounter (Signed)
Patient calls nurse line reporting an mistake on recent FMLA forms.   She reports she will be dropping off forms on Monday with instructions from [employer] to fix.   Patient to ask for [place].

## 2022-05-04 NOTE — Telephone Encounter (Signed)
Patient brings form in.  Intermittent leave dates need to be changed from reduced schedule dates.   Just initial and date is needed on form.   I have placed form in PCP box. This must be done today. If this can not be done please let me know and I will have a preceptor sign.  Please give to me to fax back.

## 2022-05-04 NOTE — Telephone Encounter (Signed)
With help of preceptor form completed and faxed to [employer].

## 2022-05-05 ENCOUNTER — Other Ambulatory Visit (HOSPITAL_COMMUNITY): Payer: Self-pay

## 2022-05-11 ENCOUNTER — Other Ambulatory Visit (HOSPITAL_COMMUNITY): Payer: Self-pay

## 2022-05-19 DIAGNOSIS — Z794 Long term (current) use of insulin: Secondary | ICD-10-CM | POA: Diagnosis not present

## 2022-05-19 DIAGNOSIS — E119 Type 2 diabetes mellitus without complications: Secondary | ICD-10-CM | POA: Diagnosis not present

## 2022-05-21 ENCOUNTER — Other Ambulatory Visit (HOSPITAL_COMMUNITY): Payer: Self-pay

## 2022-05-28 ENCOUNTER — Telehealth: Payer: Self-pay

## 2022-05-28 NOTE — Telephone Encounter (Signed)
Inbound fax from DME supplier requesting form be completed and faxed with clinical notes. DME supplies ordered via [employer] through online portal.  

## 2022-05-29 ENCOUNTER — Ambulatory Visit: Payer: 59 | Admitting: Internal Medicine

## 2022-05-29 ENCOUNTER — Encounter: Payer: Self-pay | Admitting: Internal Medicine

## 2022-05-29 VITALS — BP 106/78 | HR 96 | Ht 67.0 in | Wt 238.4 lb

## 2022-05-29 DIAGNOSIS — E785 Hyperlipidemia, unspecified: Secondary | ICD-10-CM

## 2022-05-29 DIAGNOSIS — E113291 Type 2 diabetes mellitus with mild nonproliferative diabetic retinopathy without macular edema, right eye: Secondary | ICD-10-CM | POA: Diagnosis not present

## 2022-05-29 DIAGNOSIS — E669 Obesity, unspecified: Secondary | ICD-10-CM

## 2022-05-29 DIAGNOSIS — Z794 Long term (current) use of insulin: Secondary | ICD-10-CM

## 2022-05-29 LAB — POCT GLYCOSYLATED HEMOGLOBIN (HGB A1C): Hemoglobin A1C: 6.1 % — AB (ref 4.0–5.6)

## 2022-05-29 NOTE — Progress Notes (Signed)
Patient ID: Sarah Walls, female   DOB: February 08, 1961, 61 y.o.   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 Last visit 4 months ago.  Interim history: No increased urination, blurry vision, chest pain. She has a history of nausea - has Meniere's ds. >> improved. Now takes Zyrtec every day, which helps. She continues to have PVCs.  She is on metoprolol.  Reviewed HbA1c levels: Lab Results  Component Value Date   HGBA1C 6.2 (A) 01/19/2022   HGBA1C 7.8 (A) 09/15/2021   HGBA1C 8.2 (A) 04/02/2021   HGBA1C 7.9 (A) 12/17/2020   HGBA1C 7.8 (A) 08/16/2020   HGBA1C 8.9 (A) 02/13/2020   HGBA1C 8.8 (A) 03/21/2019   HGBA1C 10.1 (A) 08/10/2018   HGBA1C 8.1 (A) 03/04/2018   HGBA1C 8.3 10/29/2017   She is on: - Metformin 2000 mg with dinner >> ER 2000 mg with dinner >>  in am >> 1000 mg 2x a day - but forgetting it at night >> 2000 mg with breakfast - Jardiance 25 mg before breakfast - Trulicity 1.5 >> 3 mg weekly in a.m.- restarted 01/2020 - Toujeo 60 units in a.m.>> restarted 20-40 units in a.m. 01/2020 >> 36-40 >> 40 >> 30 >> 20 units daily Unfortunately, 3 weeks before last visit, she came off Jardiance as she could not refill it We stopped Januvia when we started Trulicity. She was previously on glipizide twice a day. She was previously on Levemir 40 units twice a day  Pt checks her sugars >4x a day - Dexcom ([place], was not able to obtain it from [facility]):    Previously:  Previously:   Lowest sugar was 80 >> 88 >> 80s >> 67; she has hypoglycemia awareness in the 70s. Highest sugar was 276 >> 170 >> 180 >> 200  Glucometer: AccuChek  Pt's meals are: - Breakfast: b'fast sandwich + yoghurt; boiled eggs + slice of bacon + yoghurt - Lunch: frozen meal +/- salad - Dinner: frozen meals - Snacks: zucchini bread, apple  -No CKD, last BUN/creatinine:   Lab Results  Component Value Date   BUN 10 01/07/2022   BUN 12 11/06/2021   CREATININE 0.59 01/07/2022   CREATININE 0.70 11/06/2021  On lisinopril.  -+ HL; last set of lipids: Lab Results  Component Value Date   CHOL 124 01/19/2022   HDL 47.90 01/19/2022   LDLCALC 51 01/19/2022   LDLDIRECT 140.1 10/06/2011   TRIG 123.0 01/19/2022   CHOLHDL 3 01/19/2022  On Lipitor 40.  - last eye exam was in fall 2022: No DR, previously + mild DR; she has a history of cataract surgery in 2016.  - no numbness and tingling in her feet.  Last foot exam 09/2021.  She had a 2dECHO 10/2020 >> EF 50%  - checked 2/2 chronic tachycardia. 03/08/2022: Coronary calcium score 0.  ROS: + See HPI  I reviewed pt's medications, allergies, PMH, social hx, family hx, and changes were documented in the history of present illness. Otherwise, unchanged from my initial visit note.  Past Medical History:  Diagnosis Date   AC joint pain 08/13/2011   Allergy    Arthritis    Back pain 11/20/2010   Bipolar disorder ([place])    Cataract    Depression    BAD2 - Dr. Albertine Patricia   Diabetes mellitus without complication ([place])    Hyperlipidemia    Hypertension  Panic attacks    BAD2 - Dr. 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    Social History Main Topics   Smoking status: Never Smoker   Smokeless tobacco: Never Used   Alcohol use No   Drug use: No   Social History  Narrative   05/2009 Masters in counseling in disabled.   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       Mother lived in home.  (Deceased quickly Spring 09 from metastatic melanoma)   Current Outpatient Medications on File Prior to Visit  Medication Sig Dispense Refill   ACCU-CHEK FASTCLIX LANCETS MISC Check blood 1 - 2 times daily and as directed. Dx E11.65 200 each 1   ACCU-CHEK GUIDE test strip CHECK BLOOD 1 - 2 TIMES DAILY AND AS DIRECTED. DX E11.65 200 strip 1   ALPRAZolam (XANAX) 1 MG tablet Take 1 tablet (1 mg total) by mouth daily as needed for severe anxiety/ panic 30 tablet 1   atorvastatin (LIPITOR) 80 MG tablet Take 1 tablet (80 mg total) by mouth daily at 6 PM. 90 tablet 3   buPROPion (WELLBUTRIN XL) 300 MG 24 hr tablet Take 1 tablet by mouth upon waking daily. 90 tablet 1   buPROPion (WELLBUTRIN XL) 300 MG 24 hr tablet Take 1 tablet (300 mg total) by mouth daily upon wakening 90 tablet 0   carbamide peroxide (DEBROX) 6.5 % OTIC solution Place 5 drops into the left ear 2 (two) times daily. 15 mL 0   chlorhexidine (PERIDEX) 0.12 % solution  (Patient not taking: Reported on 02/13/2022)  3   Continuous Blood Gluc Receiver (DEXCOM G6 RECEIVER) DEVI 1 Device by Does not apply route once for 1 dose. 1 each 0   Continuous Blood Gluc Sensor (DEXCOM G6 SENSOR) MISC Inject 1 Device into the skin continuous for 10 days. 9 each 3   Continuous Blood Gluc Transmit (DEXCOM G6 TRANSMITTER) MISC 1 Device by Does not apply route every 3 (three) months. 1 each 3   Dulaglutide (TRULICITY) 3 FY/1.0FB SOPN Inject 3 mg into the skin once a week. 2 mL 1   empagliflozin (JARDIANCE) 25 MG TABS tablet Take 1 tablet (25 mg total) by mouth daily. 90 tablet 3   insulin glargine, 2 Unit Dial, (TOUJEO MAX SOLOSTAR) 300 UNIT/ML Solostar Pen Inject 40 Units into the skin daily. (Patient taking differently: Inject 20 Units into the skin daily.) 9 mL 3   Insulin Pen Needle  (UNIFINE PENTIPS) 31G X 5 MM MISC USE DAILY WITH INSULIN PEN 100 each 3   lisinopril (ZESTRIL) 10 MG tablet Take 1 tablet (10 mg total) by mouth at bedtime. 90 tablet 1   lurasidone (LATUDA) 20 MG TABS tablet Take 1 tablet (20 mg total) by mouth every evening with  food 30 tablet 2   metFORMIN (GLUCOPHAGE-XR) 500 MG 24 hr tablet Take 4 tablets (2,000 mg total) by mouth daily with supper. 360 tablet 0   metoprolol tartrate (LOPRESSOR) 25 MG tablet Take 1 tablet by mouth 2 times daily. 180 tablet 3   Multiple Vitamin (MULTIVITAMIN) tablet Take 1 tablet by mouth daily.     ondansetron (ZOFRAN-ODT) 4 MG disintegrating tablet Take 1 tablet (4 mg total) by mouth every 8 (eight) hours as needed for nausea or vomiting. 9 tablet 0   terbinafine (LAMISIL) 1 % cream Apply 1 application topically 2 (two) times daily. (Patient not taking: Reported on 02/13/2022) 30 g 1   No current facility-administered medications on file prior to visit.   Allergies  Allergen Reactions   Beta Adrenergic Blockers Other (See Comments)    Drowsy, fatigue   Family History  Problem Relation Age of Onset   Cancer Mother        Metastatic liver CA   Thyroid disease Mother        Hypothyroidism   Vision loss Mother        Eye tumor, removed orbit at the end of 2004   Melanoma Mother    Diabetes Mother    Diabetes Father    Heart disease Father        CABG, coronary disease   Thyroid disease Sister        Hypothyroidism   Colon cancer Maternal Aunt 45   Breast cancer Neg Hx    Colon polyps Neg Hx    Esophageal cancer Neg Hx    Rectal cancer Neg Hx    Stomach cancer Neg Hx    PE: Ht '5\' 7"'$  (1.702 m)   Wt 238 lb 6.4 oz (108.1 kg)   LMP 08/28/2015 (Approximate)   BMI 37.34 kg/m  Wt Readings from Last 3 Encounters:  05/29/22 238 lb 6.4 oz (108.1 kg)  04/20/22 229 lb 6.4 oz (104.1 kg)  04/02/22 231 lb 3.2 oz (104.9 kg)   Constitutional: overweight, in NAD Eyes: EOMI, no exophthalmos ENT: no thyromegaly, no  cervical lymphadenopathy Cardiovascular: Tachycardia, RR, No MRG Respiratory: CTA B Musculoskeletal: no deformities Skin: no rashes Neurological: no tremor with outstretched hands  ASSESSMENT: 1. 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 Obesity class 2  3. HL  PLAN:  1. Patient with longstanding, uncontrolled, type 2 diabetes, on oral antidiabetic regimen with metformin and SGLT2 inhibitor and also weekly GLP-1 receptor agonist and daily insulin, with improved control at last visit.  At that time, HbA1c was lower, at 6.2%.  She returned after losing approximately 30 pounds.  We were able to reduce her Toujeo dose-most of her blood sugars were at goal, with values trending down during the night. CGM interpretation: -At today's visit, we reviewed her CGM downloads: It appears that 92% of values are in target range (goal >70%), while 8% are higher than 180 (goal <25%), and 0% are lower than 70 (goal <4%).  The calculated average blood sugar is 130.  The projected HbA1c for the next 3 months (GMI) is 6.4%. -Reviewing the CGM trends, sugars appear to be fluctuating within the target range, with an occasional spike after meals, especially after lunch.  However, these are rare.  We discussed that this is not unusual during the holidays.  In the light of the improved HbA1c, we do not need to make any changes in her regimen for now. - I suggested to:  Patient Instructions  Please continue: - Metformin 2000 mg in am  - Jardiance 25 mg before breakfast - Trulicity 3 mg weekly - Toujeo 20 units at bedtime  Please return in 4-6 months.   - we checked her HbA1c: 6.1% (lower) - advised to check sugars at different times of the day - 4x a day, rotating check times - advised for yearly eye exams >> she is UTD - return to clinic in 4-6 months  2. Obesity class 2 -She had great success on a plant-based diet in the past but she could not continue.  We discussed about a  whole 30 diet and weight watchers in the past. -continue SGLT 2 inhibitor and GLP-1 receptor agonist which should also help with weight loss -Before last visit, she intentionally lost 31 pounds -she gained a net 4 lbs back since  3. HL -Reviewed latest lipid panel from 01/2022: All fractions at goal: Lab Results  Component Value Date   CHOL 124 01/19/2022   HDL 47.90 01/19/2022   LDLCALC 51 01/19/2022   LDLDIRECT 140.1 10/06/2011   TRIG 123.0 01/19/2022   CHOLHDL 3 01/19/2022  -she continues on Lipitor 40 mg daily without side effects  Philemon Kingdom, MD PhD [facility] Endocrinology

## 2022-05-29 NOTE — Addendum Note (Signed)
Addended by: Sarina Ill on: 05/29/2022 09:14 AM   Modules accepted: Orders

## 2022-05-29 NOTE — Patient Instructions (Signed)
Please continue: - Metformin 2000 mg in am  - Jardiance 25 mg before breakfast - Trulicity 3 mg weekly - Toujeo 20 units at bedtime  Please return in 4-6 months.

## 2022-06-04 ENCOUNTER — Other Ambulatory Visit: Payer: Self-pay

## 2022-06-04 ENCOUNTER — Other Ambulatory Visit: Payer: Self-pay | Admitting: Family Medicine

## 2022-06-04 ENCOUNTER — Other Ambulatory Visit: Payer: Self-pay | Admitting: Internal Medicine

## 2022-06-04 ENCOUNTER — Other Ambulatory Visit: Payer: Self-pay | Admitting: Student

## 2022-06-04 ENCOUNTER — Other Ambulatory Visit (HOSPITAL_COMMUNITY): Payer: Self-pay

## 2022-06-04 DIAGNOSIS — R42 Dizziness and giddiness: Secondary | ICD-10-CM

## 2022-06-04 DIAGNOSIS — I10 Essential (primary) hypertension: Secondary | ICD-10-CM

## 2022-06-04 MED ORDER — ONDANSETRON 4 MG PO TBDP
4.0000 mg | ORAL_TABLET | Freq: Three times a day (TID) | ORAL | 0 refills | Status: DC | PRN
  Filled 2022-06-04: qty 9, 3d supply, fill #0

## 2022-06-04 MED ORDER — LISINOPRIL 10 MG PO TABS
10.0000 mg | ORAL_TABLET | Freq: Every day | ORAL | 3 refills | Status: DC
  Filled 2022-06-04: qty 90, 90d supply, fill #0
  Filled 2022-09-14: qty 90, 90d supply, fill #1
  Filled 2022-12-20: qty 90, 90d supply, fill #2
  Filled 2023-04-12: qty 90, 90d supply, fill #3

## 2022-06-04 MED ORDER — METFORMIN HCL ER 500 MG PO TB24
2000.0000 mg | ORAL_TABLET | Freq: Every day | ORAL | 0 refills | Status: DC
  Filled 2022-06-04: qty 360, 90d supply, fill #0

## 2022-06-05 ENCOUNTER — Other Ambulatory Visit (HOSPITAL_COMMUNITY): Payer: Self-pay

## 2022-06-05 MED ORDER — BUPROPION HCL ER (XL) 300 MG PO TB24
300.0000 mg | ORAL_TABLET | Freq: Every day | ORAL | 1 refills | Status: DC
  Filled 2022-06-05 – 2022-08-03 (×2): qty 90, 90d supply, fill #0

## 2022-06-05 MED ORDER — LURASIDONE HCL 20 MG PO TABS
20.0000 mg | ORAL_TABLET | Freq: Every evening | ORAL | 2 refills | Status: DC
  Filled 2022-06-05: qty 30, 30d supply, fill #0
  Filled 2022-08-03: qty 30, 30d supply, fill #1

## 2022-06-06 ENCOUNTER — Other Ambulatory Visit: Payer: Self-pay

## 2022-06-09 ENCOUNTER — Other Ambulatory Visit (HOSPITAL_COMMUNITY): Payer: Self-pay

## 2022-06-12 ENCOUNTER — Other Ambulatory Visit (HOSPITAL_COMMUNITY): Payer: Self-pay

## 2022-07-04 ENCOUNTER — Other Ambulatory Visit: Payer: Self-pay | Admitting: Internal Medicine

## 2022-07-06 ENCOUNTER — Other Ambulatory Visit: Payer: Self-pay

## 2022-07-06 ENCOUNTER — Other Ambulatory Visit (HOSPITAL_COMMUNITY): Payer: Self-pay

## 2022-07-06 DIAGNOSIS — F410 Panic disorder [episodic paroxysmal anxiety] without agoraphobia: Secondary | ICD-10-CM | POA: Diagnosis not present

## 2022-07-06 DIAGNOSIS — F3181 Bipolar II disorder: Secondary | ICD-10-CM | POA: Diagnosis not present

## 2022-07-06 DIAGNOSIS — F900 Attention-deficit hyperactivity disorder, predominantly inattentive type: Secondary | ICD-10-CM | POA: Diagnosis not present

## 2022-07-06 MED ORDER — LURASIDONE HCL 20 MG PO TABS
ORAL_TABLET | ORAL | 3 refills | Status: DC
  Filled 2022-07-06: qty 30, 30d supply, fill #0
  Filled 2022-09-14: qty 30, 30d supply, fill #1
  Filled 2022-10-15: qty 30, 30d supply, fill #2
  Filled 2022-11-21: qty 30, 30d supply, fill #3

## 2022-07-06 MED ORDER — TRULICITY 3 MG/0.5ML ~~LOC~~ SOAJ
3.0000 mg | SUBCUTANEOUS | 1 refills | Status: DC
  Filled 2022-07-06: qty 6, 84d supply, fill #0
  Filled 2022-09-14: qty 6, 84d supply, fill #1

## 2022-07-06 MED ORDER — BUPROPION HCL ER (XL) 300 MG PO TB24
300.0000 mg | ORAL_TABLET | Freq: Every day | ORAL | 0 refills | Status: DC
  Filled 2022-07-06: qty 90, 90d supply, fill #0

## 2022-07-08 ENCOUNTER — Other Ambulatory Visit (HOSPITAL_COMMUNITY): Payer: Self-pay

## 2022-08-03 ENCOUNTER — Other Ambulatory Visit: Payer: Self-pay

## 2022-08-03 ENCOUNTER — Other Ambulatory Visit: Payer: Self-pay | Admitting: Family Medicine

## 2022-08-03 ENCOUNTER — Other Ambulatory Visit (HOSPITAL_COMMUNITY): Payer: Self-pay

## 2022-08-03 DIAGNOSIS — R42 Dizziness and giddiness: Secondary | ICD-10-CM

## 2022-08-03 MED ORDER — ONDANSETRON 4 MG PO TBDP
4.0000 mg | ORAL_TABLET | Freq: Three times a day (TID) | ORAL | 0 refills | Status: DC | PRN
  Filled 2022-08-03: qty 9, 3d supply, fill #0

## 2022-08-21 ENCOUNTER — Telehealth: Payer: Self-pay

## 2022-08-21 NOTE — Telephone Encounter (Signed)
Patient calls nurse in regards to [facility] forms.   She reports it is time for recertification. She reports [employer] is sending forms over "sometime soon."   She wants to make sure she does not need to come in for forms to be filled out   Will forward to PCP for clarification on apt.

## 2022-08-24 NOTE — Telephone Encounter (Signed)
Attempted to call patient, however no answer.   VM left to call back and schedule an apt for [facility] paperwork.

## 2022-09-02 ENCOUNTER — Other Ambulatory Visit: Payer: Self-pay

## 2022-09-02 ENCOUNTER — Encounter: Payer: Self-pay | Admitting: Family Medicine

## 2022-09-02 ENCOUNTER — Ambulatory Visit: Payer: Commercial Managed Care - PPO | Admitting: Family Medicine

## 2022-09-02 VITALS — BP 121/81 | HR 86 | Ht 67.0 in | Wt 239.0 lb

## 2022-09-02 DIAGNOSIS — H8102 Meniere's disease, left ear: Secondary | ICD-10-CM

## 2022-09-02 NOTE — Progress Notes (Addendum)
    SUBJECTIVE:   CHIEF COMPLAINT / HPI:   Sarah Walls is a 62 y.o. female who presents to the [facility] clinic today to discuss the following concerns:   FMLA: [place]  No sx today. Reports that she is learning how to manage her symptoms better. Feels like she is catching sx quicker. Doesn't feel like the entire room is spinning anymore. Has been working on getting enough rest and when she does have sx onset she treis to rest which eases her symptoms. Doesn't have a lot of salt in diet. Still has bouts of nausea with her dizziness but this is controlled with Zofran. Takes Zofran about every every other episode. Episodes occur about twice a month. No falls. No vomiting. Has not seen ENT.   PERTINENT  PMH / PSH: Hypertension, class III obesity, hyperlipidemia, type 2 diabetes  OBJECTIVE:   BP 121/81   Pulse 86   Ht 5\' 7"  (1.702 m)   Wt 239 lb (108.4 kg)   LMP 08/28/2015 (Approximate)   SpO2 97%   BMI 37.43 kg/m    General: NAD, pleasant, able to participate in exam Respiratory: normal effort Neuro: alert, awake, normal speech, normal gait, able to get up from seated position without assistance, no obvious focal deficits Psych: Normal affect and mood  ASSESSMENT/PLAN:   1. Meniere's disease of left ear Has not yet been evaluated by ENT for this though is going to search for in-network providers. Overall seems much more stable. Symptoms are decreased in intensity, still having only 1-2 flares each month. Nausea is controlled with Zofran which she is using sparingly. -FMLA form completed today -Recommend f/u every 6 months or sooner if sx worsen or change -Encouraged ENT f/u    Sharion Settler, [place]

## 2022-09-02 NOTE — Patient Instructions (Addendum)
It was wonderful to see you today.  Please bring ALL of your medications with you to every visit.   Today we talked about:  I would be happy to send another referral for ENT, let me know. I have filled out your FMLA form. Please plan to check in with [place] in about 6 months. Obviously return sooner if you are having worsening flares or other concerns.   Thank you for coming to your visit as scheduled. We have had a large "no-show" problem lately, and this significantly limits our ability to see and care for patients. As a friendly reminder- if you cannot make your appointment please call to cancel. We do have a no show policy for those who do not cancel within 24 hours. Our policy is that if you miss or fail to cancel an appointment within 24 hours, 3 times in a 43-month period, you may be dismissed from our clinic.   Thank you for choosing [facility].   Please call [phone] with any questions about today's appointment.  Please be sure to schedule follow up at the front  desk before you leave today.   Sharion Settler, DO PGY-3 Family Medicine

## 2022-09-10 ENCOUNTER — Telehealth: Payer: Self-pay

## 2022-09-10 NOTE — Telephone Encounter (Signed)
Patient calls nurse line regarding FMLA paperwork.   She was seen in the office on 3/20 and was told that paperwork would be faxed. Paperwork was faxed on 3/20, however, fax number was not correct.   Faxed to corrected fax number.   Talbot Grumbling, RN

## 2022-09-14 ENCOUNTER — Other Ambulatory Visit (HOSPITAL_COMMUNITY): Payer: Self-pay

## 2022-09-14 ENCOUNTER — Other Ambulatory Visit: Payer: Self-pay | Admitting: Internal Medicine

## 2022-09-15 ENCOUNTER — Other Ambulatory Visit (HOSPITAL_COMMUNITY): Payer: Self-pay

## 2022-09-15 ENCOUNTER — Other Ambulatory Visit: Payer: Self-pay

## 2022-09-15 MED ORDER — METFORMIN HCL ER 500 MG PO TB24
2000.0000 mg | ORAL_TABLET | Freq: Every day | ORAL | 0 refills | Status: DC
  Filled 2022-09-15: qty 360, 90d supply, fill #0

## 2022-09-22 DIAGNOSIS — E119 Type 2 diabetes mellitus without complications: Secondary | ICD-10-CM | POA: Diagnosis not present

## 2022-10-15 ENCOUNTER — Other Ambulatory Visit: Payer: Self-pay

## 2022-10-30 ENCOUNTER — Other Ambulatory Visit: Payer: Self-pay | Admitting: Student

## 2022-10-30 ENCOUNTER — Other Ambulatory Visit (HOSPITAL_COMMUNITY): Payer: Self-pay

## 2022-10-30 ENCOUNTER — Other Ambulatory Visit: Payer: Self-pay

## 2022-10-30 DIAGNOSIS — R42 Dizziness and giddiness: Secondary | ICD-10-CM

## 2022-10-30 MED ORDER — ONDANSETRON 4 MG PO TBDP
4.0000 mg | ORAL_TABLET | Freq: Three times a day (TID) | ORAL | 0 refills | Status: DC | PRN
  Filled 2022-10-30: qty 9, 3d supply, fill #0

## 2022-11-11 ENCOUNTER — Other Ambulatory Visit (HOSPITAL_COMMUNITY): Payer: Self-pay

## 2022-11-12 ENCOUNTER — Other Ambulatory Visit (HOSPITAL_COMMUNITY): Payer: Self-pay

## 2022-11-12 MED ORDER — BUPROPION HCL ER (XL) 300 MG PO TB24
300.0000 mg | ORAL_TABLET | Freq: Every day | ORAL | 0 refills | Status: DC
  Filled 2022-11-12: qty 90, 90d supply, fill #0

## 2022-11-13 ENCOUNTER — Other Ambulatory Visit: Payer: Self-pay

## 2022-11-13 ENCOUNTER — Ambulatory Visit: Payer: Commercial Managed Care - PPO | Admitting: Student

## 2022-11-13 ENCOUNTER — Ambulatory Visit
Admission: RE | Admit: 2022-11-13 | Discharge: 2022-11-13 | Disposition: A | Discharge status: Home / Self Care | Payer: Commercial Managed Care - PPO | Source: Ambulatory Visit | Attending: Family Medicine | Admitting: Family Medicine

## 2022-11-13 VITALS — BP 120/75 | HR 90 | Ht 67.0 in | Wt 237.4 lb

## 2022-11-13 DIAGNOSIS — M545 Low back pain, unspecified: Secondary | ICD-10-CM

## 2022-11-13 DIAGNOSIS — G8929 Other chronic pain: Secondary | ICD-10-CM

## 2022-11-13 NOTE — Patient Instructions (Addendum)
It was great to see you! Thank you for allowing me to participate in your care!  I recommend that you always bring your medications to each appointment as this makes it easy to ensure we are on the correct medications and helps [place] not miss when refills are needed.  Our plans for today:  - Back pain -Sounds a lot like arthritis and can flare up from time to time. We'll get some imaging to confirm.   -Lumbar back X-ray   [facility]   [address]   [place], [place] [address]   Phone: [phone]  -Pain management Take over the counter pain meds as needed for pain. Can use Aleve and Tylenol together Stagger them as needed (one pill at one time, another pill 3 hours later)    Consider Lidocaine patches and Voltaren gel, both work great!  - Follow up in 2-4 weeks if mobillity not improved, will consider PT -      Take care and seek immediate care sooner if you develop any concerns.   Dr. Bess Kinds, MD [facility]

## 2022-11-13 NOTE — Progress Notes (Unsigned)
  SUBJECTIVE:   CHIEF COMPLAINT / HPI:   Back Pain -Has had this issues off and on for a few years, flares up every so often.  -Usually uses rest and advil to help w/ main / resolve symptoms -Flared up Monday, no truama or injury, had just woken up and felt she may have slept wrong -Pain feels like a muscle ache and tightens up when she moves certain ways -Does not radiate down leg or around back   PERTINENT  PMH / PSH:    Patient Care Team: Sabino Dick, DO as PCP - General (Family Medicine) OBJECTIVE:  BP 120/75   Pulse 90   Ht 5\' 7"  (1.702 m)   Wt 237 lb 6.4 oz (107.7 kg)   LMP 08/28/2015 (Approximate)   SpO2 97%   BMI 37.18 kg/m  Physical Exam Constitutional:      General: She is not in acute distress.    Appearance: Normal appearance.  Musculoskeletal:     Cervical back: Normal.     Thoracic back: Normal.     Lumbar back: Tenderness and bony tenderness present. No swelling, deformity, lacerations or spasms. Normal range of motion. Negative right straight leg raise test and negative left straight leg raise test.     Comments: Slightly limping/ from pain  Neurological:     Mental Status: She is alert.      ASSESSMENT/PLAN:  Chronic midline low back pain without sciatica Assessment & Plan: Patient presents for concern of back pain.  Patient has had this issue for years and notes that her pain flares up every now and again.  She is usually able to rest and take Advil to relieve her symptoms.  But notes this flareup started Monday, and has not responded to relieving rest.  Patient denies any trauma but notes that she may have slept funny and woke up with back pain.  Physical exam significant for tenderness in the lumbar region, with normal range of motion, negative straight leg rise.  Patient's symptoms given age and history most concerning for arthritis.  Low concern for muscle spasm or sciatica given location of pain and function/symptoms. Will obtain imaging and  use over-the-counter pain meds as needed.  Will have patient follow-up in 2 to 4 weeks and consider PT if not improved. - DG lumbar - Aleve and Tylenol as needed - Voltaren and lidocaine patches as needed - Follow-up 2 to 4 weeks, or sooner if mobility changes, will consider PT  Orders: -     DG Lumbar Spine Complete; Future   No follow-ups on file. Bess Kinds, MD 11/13/2022, 9:52 AM PGY-2, [facility] {    This will disappear when note is signed, click to select method of visit    :1}

## 2022-11-13 NOTE — Assessment & Plan Note (Signed)
Patient presents for concern of back pain.  Patient has had this issue for years and notes that her pain flares up every now and again.  She is usually able to rest and take Advil to relieve her symptoms.  But notes this flareup started Monday, and has not responded to relieving rest.  Patient denies any trauma but notes that she may have slept funny and woke up with back pain.  Physical exam significant for tenderness in the lumbar region, with normal range of motion, negative straight leg rise.  Patient's symptoms given age and history most concerning for arthritis.  Low concern for muscle spasm or sciatica given location of pain and function/symptoms. Will obtain imaging and use over-the-counter pain meds as needed.  Will have patient follow-up in 2 to 4 weeks and consider PT if not improved. - DG lumbar - Aleve and Tylenol as needed - Voltaren and lidocaine patches as needed - Follow-up 2 to 4 weeks, or sooner if mobility changes, will consider PT

## 2022-11-18 ENCOUNTER — Encounter: Payer: Self-pay | Admitting: Student

## 2022-12-11 ENCOUNTER — Ambulatory Visit: Payer: Commercial Managed Care - PPO | Admitting: Internal Medicine

## 2022-12-11 ENCOUNTER — Encounter: Payer: Self-pay | Admitting: Internal Medicine

## 2022-12-11 VITALS — BP 120/82 | HR 98 | Ht 67.0 in | Wt 235.6 lb

## 2022-12-11 DIAGNOSIS — E113291 Type 2 diabetes mellitus with mild nonproliferative diabetic retinopathy without macular edema, right eye: Secondary | ICD-10-CM | POA: Diagnosis not present

## 2022-12-11 DIAGNOSIS — Z7985 Long-term (current) use of injectable non-insulin antidiabetic drugs: Secondary | ICD-10-CM

## 2022-12-11 DIAGNOSIS — E669 Obesity, unspecified: Secondary | ICD-10-CM | POA: Diagnosis not present

## 2022-12-11 DIAGNOSIS — Z794 Long term (current) use of insulin: Secondary | ICD-10-CM

## 2022-12-11 DIAGNOSIS — E08319 Diabetes mellitus due to underlying condition with unspecified diabetic retinopathy without macular edema: Secondary | ICD-10-CM

## 2022-12-11 DIAGNOSIS — Z7984 Long term (current) use of oral hypoglycemic drugs: Secondary | ICD-10-CM

## 2022-12-11 DIAGNOSIS — E785 Hyperlipidemia, unspecified: Secondary | ICD-10-CM

## 2022-12-11 DIAGNOSIS — E119 Type 2 diabetes mellitus without complications: Secondary | ICD-10-CM | POA: Diagnosis not present

## 2022-12-11 LAB — HEMOGLOBIN A1C: Hemoglobin A1C: 6.2

## 2022-12-11 MED ORDER — FREESTYLE LIBRE 3 SENSOR MISC: 1.0000 | 3 refills | Status: DC

## 2022-12-11 NOTE — Progress Notes (Signed)
Patient ID: Sarah Walls, female   DOB: 05-25-1961, 62 y.o.   MRN: [id number]   HPI: Sarah Walls is a 62 y.o.-year-old female, presenting for f/u for DM2, dx in ~2010, insulin-dependent since ~2015-16, now more controlled, with complications (mild DR). Last visit 6 months ago.  Interim history: No increased urination, blurry vision, chest pain. She has a history of nausea - has Meniere's ds. >> improved. She has FMLA in place if she occasionally has symptoms of this. She was able to stop the insulin in 09/2022!  Reviewed HbA1c levels: Lab Results  Component Value Date   HGBA1C 6.1 (A) 05/29/2022   HGBA1C 6.2 (A) 01/19/2022   HGBA1C 7.8 (A) 09/15/2021   HGBA1C 8.2 (A) 04/02/2021   HGBA1C 7.9 (A) 12/17/2020   HGBA1C 7.8 (A) 08/16/2020   HGBA1C 8.9 (A) 02/13/2020   HGBA1C 8.8 (A) 03/21/2019   HGBA1C 10.1 (A) 08/10/2018   HGBA1C 8.1 (A) 03/04/2018   She is on: - Metformin 2000 mg with dinner >> ER 2000 mg with dinner >>  in am >> 1000 mg 2x a day - but forgetting it at night >> 2000 mg with breakfast - Jardiance 25 mg before breakfast - Trulicity 1.5 >> 3 mg weekly in a.m.- restarted 01/2020 -  >> off for 2 months Unfortunately, 3 weeks before last visit, she came off Jardiance as she could not refill it We stopped Januvia when we started Trulicity. She was previously on glipizide twice a day. She was previously on Levemir 40 units twice a day  Pt checks her sugars >4x a day - Dexcom ([place], was not able to obtain it from [facility]):  Previously:   Previously:  Lowest sugar was 80s >> 67 >> 60s; she has hypoglycemia awareness in the 70s. Highest sugar was 276 >> ... 200 >> 200s  Glucometer: AccuChek  Pt's meals are: - Breakfast: b'fast sandwich + yoghurt; boiled eggs + slice of bacon + yoghurt - Lunch: frozen meal +/- salad - Dinner: frozen meals - Snacks: zucchini bread, apple  -No CKD, last BUN/creatinine:  Lab Results  Component Value Date   BUN 10 01/07/2022   BUN  12 11/06/2021   CREATININE 0.59 01/07/2022   CREATININE 0.70 11/06/2021  On lisinopril.  -+ HL; last set of lipids: Lab Results  Component Value Date   CHOL 124 01/19/2022   HDL 47.90 01/19/2022   LDLCALC 51 01/19/2022   LDLDIRECT 140.1 10/06/2011   TRIG 123.0 01/19/2022   CHOLHDL 3 01/19/2022  On Lipitor 40.  - last eye exam was in fall 2023: No DR, previously + mild DR; she has a history of cataract surgery in 2016.  - no numbness and tingling in her feet.  Last foot exam 09/2021.  She had a 2dECHO 10/2020 >> EF 50%  - checked 2/2 chronic tachycardia. 03/08/2022: Coronary calcium score 0.  ROS: + See HPI  I reviewed pt's medications, allergies, PMH, social hx, family hx, and changes were documented in the history of present illness. Otherwise, unchanged from my initial visit note.  Past Medical History:  Diagnosis Date   AC joint pain 08/13/2011   Allergy    Arthritis    Back pain 11/20/2010   Bipolar disorder (HCC)    Cataract    Depression    BAD2 - Dr. Madaline Walls   Diabetes mellitus without complication (HCC)    Hyperlipidemia    Hypertension    Panic attacks    BAD2 - Dr. 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    Social History Main Topics   Smoking status: Never Smoker   Smokeless tobacco: Never Used   Alcohol use No   Drug use: No   Social History Narrative   05/2009 Masters in counseling in disabled.   One  disabled child, has a rare chromosome deletion disorder (decreased mental ability).       Mother lived in home.  (Deceased quickly Spring 09 from metastatic melanoma)   Current Outpatient Medications on File Prior to Visit  Medication Sig Dispense Refill   ACCU-CHEK FASTCLIX LANCETS MISC Check blood 1 - 2 times daily and as directed. Dx E11.65 200 each 1   ACCU-CHEK GUIDE test strip CHECK BLOOD 1 - 2 TIMES DAILY AND AS DIRECTED. DX E11.65 200 strip 1   ALPRAZolam (XANAX) 1 MG tablet Take 1 tablet (1 mg total) by mouth daily as needed for severe anxiety/ panic 30 tablet 1   atorvastatin (LIPITOR) 80 MG tablet Take 1 tablet (80 mg total) by mouth daily at 6 PM. 90 tablet 3   buPROPion (WELLBUTRIN XL) 300 MG 24 hr tablet Take 1 tablet (300 mg) by mouth daily as soon as you wake up. 90 tablet 0   carbamide peroxide (DEBROX) 6.5 % OTIC solution Place 5 drops into the left ear 2 times daily. (Patient not taking: Reported on 09/02/2022) 15 mL 0   chlorhexidine (PERIDEX) 0.12 % solution   3   Continuous Blood Gluc Receiver (DEXCOM G6 RECEIVER) DEVI 1 Device by Does not apply route once for 1 dose. 1 each 0   Continuous Blood Gluc Sensor (DEXCOM G6 SENSOR) MISC Inject 1 Device into the skin continuous for 10 days. 9 each 3   Continuous Blood Gluc Transmit (DEXCOM G6 TRANSMITTER) MISC 1 Device by Does not apply route every 3 (three) months. 1 each 3   Dulaglutide (TRULICITY) 3 MG/0.5ML SOPN Inject 3 mg into the skin once a week. 6 mL 1   empagliflozin (JARDIANCE) 25 MG TABS tablet Take 1 tablet (25 mg total) by mouth daily. 90 tablet 3   insulin glargine, 2 Unit Dial, (TOUJEO MAX SOLOSTAR) 300 UNIT/ML Solostar Pen Inject 40 Units into the skin daily. (Patient taking differently: Inject 20 Units into the skin daily.) 9 mL 3   Insulin Pen Needle (UNIFINE PENTIPS) 31G X 5 MM MISC USE DAILY WITH INSULIN PEN 100 each 3   lisinopril (ZESTRIL) 10 MG tablet Take 1 tablet (10 mg total) by mouth at bedtime. 90 tablet  3   lurasidone (LATUDA) 20 MG TABS tablet Take 1 tablet (20 mg total) by mouth every evening with food. 30 tablet 2   lurasidone (LATUDA) 20 MG TABS tablet Take 1 tablet (20 mg total) by mouth every evening with food 30 tablet 3   metFORMIN (GLUCOPHAGE-XR) 500  MG 24 hr tablet Take 4 tablets (2,000 mg total) by mouth daily with supper. 360 tablet 0   metoprolol tartrate (LOPRESSOR) 25 MG tablet Take 1 tablet by mouth 2 times daily. 180 tablet 3   Multiple Vitamin (MULTIVITAMIN) tablet Take 1 tablet by mouth daily.     ondansetron (ZOFRAN-ODT) 4 MG disintegrating tablet Dissolve 1 tablet by mouth every 8 hours as needed for nausea or vomiting. 9 tablet 0   terbinafine (LAMISIL) 1 % cream Apply 1 application topically 2 (two) times daily. (Patient not taking: Reported on 09/02/2022) 30 g 1   No current facility-administered medications on file prior to visit.   Allergies  Allergen Reactions   Beta Adrenergic Blockers Other (See Comments)    Drowsy, fatigue   Family History  Problem Relation Age of Onset   Cancer Mother        Metastatic liver CA   Thyroid disease Mother        Hypothyroidism   Vision loss Mother        Eye tumor, removed orbit at the end of 2004   Melanoma Mother    Diabetes Mother    Diabetes Father    Heart disease Father        CABG, coronary disease   Thyroid disease Sister        Hypothyroidism   Colon cancer Maternal Aunt 15   Breast cancer Neg Hx    Colon polyps Neg Hx    Esophageal cancer Neg Hx    Rectal cancer Neg Hx    Stomach cancer Neg Hx    PE: BP 120/82   Pulse 98   Ht 5\' 7"  (1.702 m)   Wt 235 lb 9.6 oz (106.9 kg)   LMP 08/28/2015 (Approximate)   SpO2 95%   BMI 36.90 kg/m  Wt Readings from Last 3 Encounters:  12/11/22 235 lb 9.6 oz (106.9 kg)  11/13/22 237 lb 6.4 oz (107.7 kg)  09/02/22 239 lb (108.4 kg)   Constitutional: overweight, in NAD Eyes: EOMI, no exophthalmos ENT: no thyromegaly, no cervical lymphadenopathy Cardiovascular:  tachycardia, RR, No MRG Respiratory: CTA B Musculoskeletal: no deformities Skin: no rashes Neurological: no tremor with outstretched hands Diabetic Foot Exam - Simple   Simple Foot Form Diabetic Foot exam was performed with the following findings: Yes 12/11/2022  9:23 AM  Visual Inspection No deformities, no ulcerations, no other skin breakdown bilaterally: Yes Sensation Testing Intact to touch and monofilament testing bilaterally: Yes Pulse Check Posterior Tibialis and Dorsalis pulse intact bilaterally: Yes Comments    ASSESSMENT: 1. DM2, insulin-dependent, now controlled, with complications - mild DR  2. Obesity class 2  3. HL  PLAN:  1. Patient with longstanding, now more controlled type 2 diabetes, on oral antidiabetic regimen with metformin and SGLT2 inhibitor and also weekly GLP-1 receptor agonist and previously daily long-acting insulin (but she stopped this 2 months ago due to good control).HbA1c was 6.1% at last visit, improved.  At that time sugars were fluctuating within the target range with only an occasional spike after meals, especially after lunch.  These were rare incidences, though, during the holidays.  We did not change her regimen at that time. CGM interpretation: -At today's visit, we reviewed her CGM downloads: It appears that 88% of values are in target range (goal >70%), while 11% are higher than 180 (goal <25%), and 1% are lower than 70 (goal <4%).  The calculated average blood sugar is 135.  The projected HbA1c for the  next 3 months (GMI) is 6.5%. -Reviewing the CGM trends, sugars appear to be slightly more fluctuating in the last 2 weeks compared to the previous 2 weeks, but overall well-controlled.  She had quite high blood sugars in 1 day in the last 2 weeks, when she was sick and could not take metformin.  Otherwise, sugars appear to be fairly well-controlled, despite stopping insulin 2 months ago.  For now, we can continue without insulin. - I suggested  to:  Patient Instructions  Please continue: - Metformin 2000 mg in am  - Jardiance 25 mg before breakfast - Trulicity 3 mg weekly  Please return in 4-6 months.   - we checked her HbA1c: 6.2% (only slightly higher than before, excellent) - advised to check sugars at different times of the day - 4x a day, rotating check times - advised for yearly eye exams >> she is UTD - return to clinic in 4-6 months  2. Obesity class 2 -She had great success on a plant-based diet in the past but she could not continue.  We discussed about a whole 30 diet and weight watchers in the past. -continue SGLT 2 inhibitor and GLP-1 receptor agonist which should also help with weight loss -She gained 4 pounds before last visit, previously lost 31 pounds -She lost 3 more pounds since last visit  3. HL -Reviewed latest lipid panel from 01/2022: All fractions at goal: Lab Results  Component Value Date   CHOL 124 01/19/2022   HDL 47.90 01/19/2022   LDLCALC 51 01/19/2022   LDLDIRECT 140.1 10/06/2011   TRIG 123.0 01/19/2022   CHOLHDL 3 01/19/2022  -she is on Lipitor 40 mg daily, without side effects  Carlus Pavlov, MD PhD [facility]

## 2022-12-11 NOTE — Patient Instructions (Addendum)
Please continue: - Metformin 2000 mg in am  - Jardiance 25 mg before breakfast - Trulicity 3 mg weekly  Please return in 4-6 months.

## 2022-12-20 ENCOUNTER — Other Ambulatory Visit (HOSPITAL_COMMUNITY): Payer: Self-pay

## 2022-12-20 ENCOUNTER — Other Ambulatory Visit: Payer: Self-pay | Admitting: Internal Medicine

## 2022-12-21 ENCOUNTER — Other Ambulatory Visit (HOSPITAL_COMMUNITY): Payer: Self-pay

## 2022-12-21 DIAGNOSIS — E119 Type 2 diabetes mellitus without complications: Secondary | ICD-10-CM | POA: Diagnosis not present

## 2022-12-21 MED ORDER — METFORMIN HCL ER 500 MG PO TB24
2000.0000 mg | ORAL_TABLET | Freq: Every day | ORAL | 0 refills | Status: DC
  Filled 2022-12-21: qty 360, 90d supply, fill #0

## 2022-12-22 ENCOUNTER — Other Ambulatory Visit (HOSPITAL_COMMUNITY): Payer: Self-pay

## 2022-12-22 MED ORDER — LURASIDONE HCL 20 MG PO TABS
ORAL_TABLET | ORAL | 0 refills | Status: DC
  Filled 2022-12-22: qty 30, 30d supply, fill #0

## 2022-12-29 ENCOUNTER — Ambulatory Visit (INDEPENDENT_AMBULATORY_CARE_PROVIDER_SITE_OTHER): Payer: Commercial Managed Care - PPO | Admitting: Family Medicine

## 2022-12-29 ENCOUNTER — Encounter: Payer: Self-pay | Admitting: Family Medicine

## 2022-12-29 VITALS — BP 100/70 | HR 84 | Ht 67.0 in | Wt 236.0 lb

## 2022-12-29 DIAGNOSIS — E113291 Type 2 diabetes mellitus with mild nonproliferative diabetic retinopathy without macular edema, right eye: Secondary | ICD-10-CM

## 2022-12-29 DIAGNOSIS — K219 Gastro-esophageal reflux disease without esophagitis: Secondary | ICD-10-CM | POA: Diagnosis not present

## 2022-12-29 DIAGNOSIS — Z794 Long term (current) use of insulin: Secondary | ICD-10-CM | POA: Diagnosis not present

## 2022-12-29 DIAGNOSIS — K580 Irritable bowel syndrome with diarrhea: Secondary | ICD-10-CM

## 2022-12-29 DIAGNOSIS — Z1239 Encounter for other screening for malignant neoplasm of breast: Secondary | ICD-10-CM

## 2022-12-29 DIAGNOSIS — H8102 Meniere's disease, left ear: Secondary | ICD-10-CM | POA: Diagnosis not present

## 2022-12-29 DIAGNOSIS — E782 Mixed hyperlipidemia: Secondary | ICD-10-CM | POA: Diagnosis not present

## 2022-12-29 DIAGNOSIS — I10 Essential (primary) hypertension: Secondary | ICD-10-CM

## 2022-12-29 DIAGNOSIS — Z0000 Encounter for general adult medical examination without abnormal findings: Secondary | ICD-10-CM | POA: Diagnosis not present

## 2022-12-29 NOTE — Assessment & Plan Note (Signed)
Treated well on current regimen.  Discussed with patient risks and benefits of continuing metoprolol and due to previous issue with PVC patient elected to continue taking metoprolol in addition to lisinopril.  Ordered routine CMP for monitoring.  Follow-up as needed

## 2022-12-29 NOTE — Patient Instructions (Signed)
It was wonderful to see you today.  You were seen for your annual physical exam.  We reviewed the results from your endocrinology appointment.  It appears you are doing well with your diabetes treatment.  Please continue to follow-up with your endocrinology doctor.  Also ordered routine monitoring labs for your high blood pressure and high cholesterol.  You should receive those results within the next couple of days, either via MyChart or by phone.  We also ordered your yearly mammogram you can take the sheet of paper that we gave you today to the breast imaging center and have that done when it is convenient for you.  If there are any changes on your labs we will call you to schedule follow-up otherwise we can continue to follow with [place] yearly.  Thank you for choosing [facility].   Please call [phone] with any questions about today's appointment.  Please arrive at least 15 minutes prior to your scheduled appointments.   If you had blood work today, I will send you a MyChart message or a letter if results are normal. Otherwise, I will give you a call.   If you had a referral placed, they will call you to set up an appointment. Please give [place] a call if you don't hear back in the next 2 weeks.   If you need additional refills before your next appointment, please call your pharmacy first.   Gerrit Heck, DO Family Medicine

## 2022-12-29 NOTE — Assessment & Plan Note (Signed)
Followed by endocrinology.  Patient is doing well on current regimen with a A1c of 6.2.  She is tolerating all medications well just had diabetic foot exam done at last visit endocrinology.  Has upcoming ophthalmology visit.  We will request those records after the patient has been seen.

## 2022-12-29 NOTE — Assessment & Plan Note (Signed)
Ordered yearly mammogram to be completed in September.  Up-to-date on all other screening measures.  Patient can follow-up as needed

## 2022-12-29 NOTE — Assessment & Plan Note (Signed)
Doing well, no complaints.  Intermittent FMLA paperwork and not due to be renewed until March 2025

## 2022-12-29 NOTE — Progress Notes (Signed)
    SUBJECTIVE:   CHIEF COMPLAINT / HPI:   Patient presents for annual physical.  Hypertension-currently taking lisinopril and metoprolol.  She is also taking Jardiance as part of her diabetes regimen. Feels she is doing well and has no complaints or concerns.  Was treated previously for PVCs but was cleared by cardiology to continue taking metoprolol. Diabetes mellitus- managed by endocrinology. A1c is under control GERD- not really having any symptoms, has not actually had heartburn, not on any treatment Hyperlipidemia- tolerating statin well, no muscle aches.  Menier's disease- intermittent FMLA, restrictions work well for her, does not need renewed until March of 2025  Up to date on colonoscopy next one due 2027, last Pap smear was done in 2022.  Will not need another one.  Could do mammogram if patient desires.  ACOG suggest every 2 years radiology suggests every one.  PERTINENT  PMH / PSH: meniere's disease  OBJECTIVE:   BP 100/70   Pulse 84   Ht 5\' 7"  (1.702 m)   Wt 236 lb (107 kg)   LMP 08/28/2015 (Approximate)   SpO2 98%   BMI 36.96 kg/m   General: A&O, NAD HEENT: No sign of trauma, EOM grossly intact Cardiac: RRR, no m/r/g Respiratory: CTAB, normal WOB, no w/c/r Extremities: NTTP, no peripheral edema. Psych: Appropriate mood and affect   ASSESSMENT/PLAN:   Essential hypertension Treated well on current regimen.  Discussed with patient risks and benefits of continuing metoprolol and due to previous issue with PVC patient elected to continue taking metoprolol in addition to lisinopril.  Ordered routine CMP for monitoring.  Follow-up as needed  IBS Patient reports change in condition.  Continues to have bouts of diarrhea.  Had a colonoscopy in 2022 which showed polyps they recommended a 5-year follow-up we will schedule this in 2027.  Type 2 diabetes mellitus with mild nonproliferative retinopathy without macular edema, with long-term current use of insulin  (HCC) Followed by endocrinology.  Patient is doing well on current regimen with a A1c of 6.2.  She is tolerating all medications well just had diabetic foot exam done at last visit endocrinology.  Has upcoming ophthalmology visit.  We will request those records after the patient has been seen.  Meniere disease Doing well, no complaints.  Intermittent FMLA paperwork and not due to be renewed until March 2025  Annual physical exam Ordered yearly mammogram to be completed in September.  Up-to-date on all other screening measures.  Patient can follow-up as needed    Gerrit Heck, DO [facility]

## 2022-12-29 NOTE — Assessment & Plan Note (Signed)
Patient reports change in condition.  Continues to have bouts of diarrhea.  Had a colonoscopy in 2022 which showed polyps they recommended a 5-year follow-up we will schedule this in 2027.

## 2022-12-30 ENCOUNTER — Encounter: Payer: Commercial Managed Care - PPO | Admitting: Family Medicine

## 2022-12-30 ENCOUNTER — Other Ambulatory Visit: Payer: Self-pay | Admitting: Student

## 2022-12-30 LAB — COMPREHENSIVE METABOLIC PANEL
ALT: 34 IU/L — ABNORMAL HIGH (ref 0–32)
AST: 23 IU/L (ref 0–40)
Albumin: 4.4 g/dL (ref 3.9–4.9)
Alkaline Phosphatase: 65 IU/L (ref 44–121)
BUN/Creatinine Ratio: 20 (ref 12–28)
BUN: 13 mg/dL (ref 8–27)
Bilirubin Total: 0.5 mg/dL (ref 0.0–1.2)
CO2: 22 mmol/L (ref 20–29)
Calcium: 10.1 mg/dL (ref 8.7–10.3)
Chloride: 104 mmol/L (ref 96–106)
Creatinine, Ser: 0.64 mg/dL (ref 0.57–1.00)
Globulin, Total: 2.3 g/dL (ref 1.5–4.5)
Glucose: 142 mg/dL — ABNORMAL HIGH (ref 70–99)
Potassium: 4.6 mmol/L (ref 3.5–5.2)
Sodium: 141 mmol/L (ref 134–144)
Total Protein: 6.7 g/dL (ref 6.0–8.5)
eGFR: 99 mL/min/{1.73_m2} (ref >59)

## 2022-12-30 LAB — LIPID PANEL
Chol/HDL Ratio: 2.2 ratio (ref 0.0–4.4)
Cholesterol, Total: 137 mg/dL (ref 100–199)
HDL: 61 mg/dL (ref >39)
LDL Chol Calc (NIH): 56 mg/dL (ref 0–99)
Triglycerides: 109 mg/dL (ref 0–149)
VLDL Cholesterol Cal: 20 mg/dL (ref 5–40)

## 2022-12-30 LAB — CBC WITH DIFFERENTIAL/PLATELET
Basophils Absolute: 0 10*3/uL (ref 0.0–0.2)
Basos: 0 %
EOS (ABSOLUTE): 0.3 10*3/uL (ref 0.0–0.4)
Eos: 3 %
Hematocrit: 46.8 % — ABNORMAL HIGH (ref 34.0–46.6)
Hemoglobin: 16 g/dL — ABNORMAL HIGH (ref 11.1–15.9)
Immature Grans (Abs): 0 10*3/uL (ref 0.0–0.1)
Immature Granulocytes: 0 %
Lymphocytes Absolute: 3.1 10*3/uL (ref 0.7–3.1)
Lymphs: 35 %
MCH: 31.6 pg (ref 26.6–33.0)
MCHC: 34.2 g/dL (ref 31.5–35.7)
MCV: 93 fL (ref 79–97)
Monocytes Absolute: 0.7 10*3/uL (ref 0.1–0.9)
Monocytes: 8 %
Neutrophils Absolute: 4.7 10*3/uL (ref 1.4–7.0)
Neutrophils: 54 %
Platelets: 272 10*3/uL (ref 150–450)
RBC: 5.06 x10E6/uL (ref 3.77–5.28)
RDW: 12.4 % (ref 11.7–15.4)
WBC: 8.9 10*3/uL (ref 3.4–10.8)

## 2023-01-11 ENCOUNTER — Other Ambulatory Visit (HOSPITAL_COMMUNITY): Payer: Self-pay

## 2023-01-11 ENCOUNTER — Other Ambulatory Visit: Payer: Self-pay | Admitting: Family Medicine

## 2023-01-11 ENCOUNTER — Other Ambulatory Visit: Payer: Self-pay

## 2023-01-11 DIAGNOSIS — R42 Dizziness and giddiness: Secondary | ICD-10-CM

## 2023-01-11 MED ORDER — ONDANSETRON 4 MG PO TBDP
4.0000 mg | ORAL_TABLET | Freq: Three times a day (TID) | ORAL | 0 refills | Status: DC | PRN
  Filled 2023-01-11: qty 9, 3d supply, fill #0

## 2023-01-15 ENCOUNTER — Other Ambulatory Visit (HOSPITAL_COMMUNITY): Payer: Self-pay

## 2023-01-22 ENCOUNTER — Ambulatory Visit
Admission: RE | Admit: 2023-01-22 | Discharge: 2023-01-22 | Disposition: A | Discharge status: Home / Self Care | Payer: Commercial Managed Care - PPO | Source: Ambulatory Visit | Attending: Family Medicine | Admitting: Family Medicine

## 2023-01-22 DIAGNOSIS — Z1239 Encounter for other screening for malignant neoplasm of breast: Secondary | ICD-10-CM

## 2023-01-22 DIAGNOSIS — Z1231 Encounter for screening mammogram for malignant neoplasm of breast: Secondary | ICD-10-CM | POA: Diagnosis not present

## 2023-01-26 ENCOUNTER — Other Ambulatory Visit (HOSPITAL_COMMUNITY): Payer: Self-pay

## 2023-01-26 ENCOUNTER — Other Ambulatory Visit: Payer: Self-pay | Admitting: Family Medicine

## 2023-01-26 ENCOUNTER — Other Ambulatory Visit: Payer: Self-pay | Admitting: Internal Medicine

## 2023-01-26 MED ORDER — LURASIDONE HCL 20 MG PO TABS
20.0000 mg | ORAL_TABLET | Freq: Every evening | ORAL | 0 refills | Status: DC
  Filled 2023-01-26: qty 30, 30d supply, fill #0

## 2023-01-27 ENCOUNTER — Other Ambulatory Visit (HOSPITAL_COMMUNITY): Payer: Self-pay

## 2023-01-27 ENCOUNTER — Other Ambulatory Visit: Payer: Self-pay | Admitting: Internal Medicine

## 2023-01-27 MED ORDER — EMPAGLIFLOZIN 25 MG PO TABS
25.0000 mg | ORAL_TABLET | Freq: Every day | ORAL | 3 refills | Status: DC
  Filled 2023-01-27: qty 90, 90d supply, fill #0
  Filled 2023-05-03: qty 90, 90d supply, fill #1
  Filled 2023-08-05: qty 90, 90d supply, fill #2
  Filled 2023-11-13 (×2): qty 90, 90d supply, fill #3

## 2023-01-27 MED ORDER — TRULICITY 3 MG/0.5ML ~~LOC~~ SOAJ
3.0000 mg | SUBCUTANEOUS | 1 refills | Status: DC
Start: 2023-01-27 — End: 2023-10-27
  Filled 2023-01-27: qty 2, 28d supply, fill #0
  Filled 2023-03-14: qty 2, 28d supply, fill #1
  Filled 2023-05-18: qty 2, 28d supply, fill #2
  Filled 2023-06-26 – 2023-07-08 (×2): qty 2, 28d supply, fill #3
  Filled 2023-08-22: qty 2, 28d supply, fill #4
  Filled 2023-09-18: qty 2, 28d supply, fill #5

## 2023-01-30 ENCOUNTER — Other Ambulatory Visit (HOSPITAL_COMMUNITY): Payer: Self-pay

## 2023-02-18 ENCOUNTER — Other Ambulatory Visit: Payer: Self-pay | Admitting: Student

## 2023-02-18 ENCOUNTER — Other Ambulatory Visit (HOSPITAL_COMMUNITY): Payer: Self-pay

## 2023-02-18 DIAGNOSIS — E785 Hyperlipidemia, unspecified: Secondary | ICD-10-CM

## 2023-02-18 MED ORDER — ATORVASTATIN CALCIUM 80 MG PO TABS
80.0000 mg | ORAL_TABLET | Freq: Every day | ORAL | 3 refills | Status: DC
Start: 2023-02-18 — End: 2024-03-09
  Filled 2023-02-18: qty 90, 90d supply, fill #0
  Filled 2023-05-18: qty 90, 90d supply, fill #1
  Filled 2023-05-31 – 2023-08-22 (×2): qty 90, 90d supply, fill #2
  Filled 2023-11-29: qty 90, 90d supply, fill #3

## 2023-02-19 ENCOUNTER — Other Ambulatory Visit (HOSPITAL_COMMUNITY): Payer: Self-pay

## 2023-02-22 ENCOUNTER — Other Ambulatory Visit (HOSPITAL_COMMUNITY): Payer: Self-pay

## 2023-02-22 MED ORDER — BUPROPION HCL ER (XL) 300 MG PO TB24
300.0000 mg | ORAL_TABLET | Freq: Every day | ORAL | 0 refills | Status: DC
  Filled 2023-02-22: qty 90, 90d supply, fill #0

## 2023-02-24 ENCOUNTER — Other Ambulatory Visit (HOSPITAL_COMMUNITY): Payer: Self-pay

## 2023-02-24 ENCOUNTER — Other Ambulatory Visit: Payer: Self-pay | Admitting: Family Medicine

## 2023-02-24 DIAGNOSIS — R42 Dizziness and giddiness: Secondary | ICD-10-CM

## 2023-02-24 MED ORDER — ONDANSETRON 4 MG PO TBDP
4.0000 mg | ORAL_TABLET | Freq: Three times a day (TID) | ORAL | 0 refills | Status: DC | PRN
  Filled 2023-02-24: qty 9, 3d supply, fill #0

## 2023-02-26 ENCOUNTER — Other Ambulatory Visit (HOSPITAL_COMMUNITY): Payer: Self-pay

## 2023-02-26 DIAGNOSIS — F900 Attention-deficit hyperactivity disorder, predominantly inattentive type: Secondary | ICD-10-CM | POA: Diagnosis not present

## 2023-02-26 DIAGNOSIS — F3181 Bipolar II disorder: Secondary | ICD-10-CM | POA: Diagnosis not present

## 2023-02-26 DIAGNOSIS — F410 Panic disorder [episodic paroxysmal anxiety] without agoraphobia: Secondary | ICD-10-CM | POA: Diagnosis not present

## 2023-02-26 MED ORDER — ALPRAZOLAM 1 MG PO TABS
1.0000 mg | ORAL_TABLET | Freq: Every day | ORAL | 1 refills | Status: DC
  Filled 2023-02-26: qty 15, 15d supply, fill #0
  Filled 2023-07-08: qty 15, 15d supply, fill #1

## 2023-02-26 MED ORDER — LURASIDONE HCL 20 MG PO TABS
20.0000 mg | ORAL_TABLET | Freq: Every evening | ORAL | 1 refills | Status: DC
  Filled 2023-02-26: qty 90, 90d supply, fill #0
  Filled 2023-06-14: qty 90, 90d supply, fill #1

## 2023-02-28 ENCOUNTER — Other Ambulatory Visit (HOSPITAL_COMMUNITY): Payer: Self-pay

## 2023-03-04 ENCOUNTER — Other Ambulatory Visit (HOSPITAL_COMMUNITY): Payer: Self-pay

## 2023-03-18 ENCOUNTER — Other Ambulatory Visit (HOSPITAL_COMMUNITY): Payer: Self-pay

## 2023-03-18 DIAGNOSIS — E119 Type 2 diabetes mellitus without complications: Secondary | ICD-10-CM | POA: Diagnosis not present

## 2023-03-28 ENCOUNTER — Other Ambulatory Visit: Payer: Self-pay | Admitting: Internal Medicine

## 2023-03-29 ENCOUNTER — Other Ambulatory Visit (HOSPITAL_COMMUNITY): Payer: Self-pay

## 2023-03-29 MED ORDER — METFORMIN HCL ER 500 MG PO TB24
2000.0000 mg | ORAL_TABLET | Freq: Every day | ORAL | 0 refills | Status: DC
  Filled 2023-03-29: qty 360, 90d supply, fill #0

## 2023-04-01 ENCOUNTER — Other Ambulatory Visit (HOSPITAL_COMMUNITY): Payer: Self-pay

## 2023-04-12 ENCOUNTER — Other Ambulatory Visit (HOSPITAL_COMMUNITY): Payer: Self-pay

## 2023-04-13 ENCOUNTER — Other Ambulatory Visit (HOSPITAL_COMMUNITY): Payer: Self-pay

## 2023-05-04 ENCOUNTER — Other Ambulatory Visit (HOSPITAL_COMMUNITY): Payer: Self-pay

## 2023-05-05 ENCOUNTER — Other Ambulatory Visit (HOSPITAL_COMMUNITY): Payer: Self-pay

## 2023-05-14 ENCOUNTER — Other Ambulatory Visit: Payer: Self-pay | Admitting: Cardiovascular Disease

## 2023-05-18 ENCOUNTER — Other Ambulatory Visit: Payer: Self-pay | Admitting: Family Medicine

## 2023-05-18 DIAGNOSIS — R42 Dizziness and giddiness: Secondary | ICD-10-CM

## 2023-05-19 ENCOUNTER — Other Ambulatory Visit (HOSPITAL_COMMUNITY): Payer: Self-pay

## 2023-05-19 ENCOUNTER — Other Ambulatory Visit: Payer: Self-pay

## 2023-05-19 MED ORDER — ONDANSETRON 4 MG PO TBDP
4.0000 mg | ORAL_TABLET | Freq: Three times a day (TID) | ORAL | 0 refills | Status: DC | PRN
  Filled 2023-05-19: qty 9, 3d supply, fill #0

## 2023-05-19 MED ORDER — METOPROLOL TARTRATE 25 MG PO TABS
25.0000 mg | ORAL_TABLET | Freq: Two times a day (BID) | ORAL | 0 refills | Status: DC
  Filled 2023-05-19: qty 60, 30d supply, fill #0

## 2023-05-20 ENCOUNTER — Other Ambulatory Visit (HOSPITAL_COMMUNITY): Payer: Self-pay

## 2023-05-27 ENCOUNTER — Other Ambulatory Visit (HOSPITAL_COMMUNITY): Payer: Self-pay

## 2023-05-31 ENCOUNTER — Other Ambulatory Visit (HOSPITAL_COMMUNITY): Payer: Self-pay

## 2023-05-31 DIAGNOSIS — H52223 Regular astigmatism, bilateral: Secondary | ICD-10-CM | POA: Diagnosis not present

## 2023-05-31 DIAGNOSIS — Z9841 Cataract extraction status, right eye: Secondary | ICD-10-CM | POA: Diagnosis not present

## 2023-05-31 DIAGNOSIS — Z9842 Cataract extraction status, left eye: Secondary | ICD-10-CM | POA: Diagnosis not present

## 2023-05-31 DIAGNOSIS — E119 Type 2 diabetes mellitus without complications: Secondary | ICD-10-CM | POA: Diagnosis not present

## 2023-05-31 LAB — HM DIABETES EYE EXAM

## 2023-05-31 MED ORDER — BUPROPION HCL ER (XL) 300 MG PO TB24
ORAL_TABLET | ORAL | 0 refills | Status: DC
  Filled 2023-05-31: qty 90, 90d supply, fill #0

## 2023-06-01 ENCOUNTER — Other Ambulatory Visit (HOSPITAL_COMMUNITY): Payer: Self-pay

## 2023-06-10 DIAGNOSIS — E119 Type 2 diabetes mellitus without complications: Secondary | ICD-10-CM | POA: Diagnosis not present

## 2023-06-14 ENCOUNTER — Other Ambulatory Visit: Payer: Self-pay | Admitting: Internal Medicine

## 2023-06-14 ENCOUNTER — Other Ambulatory Visit (HOSPITAL_COMMUNITY): Payer: Self-pay

## 2023-06-14 ENCOUNTER — Other Ambulatory Visit: Payer: Self-pay

## 2023-06-14 ENCOUNTER — Other Ambulatory Visit: Payer: Self-pay | Admitting: Family Medicine

## 2023-06-14 DIAGNOSIS — R42 Dizziness and giddiness: Secondary | ICD-10-CM

## 2023-06-14 MED ORDER — METFORMIN HCL ER 500 MG PO TB24
2000.0000 mg | ORAL_TABLET | Freq: Every day | ORAL | 3 refills | Status: DC
  Filled 2023-06-14: qty 360, 90d supply, fill #0
  Filled 2023-09-18: qty 360, 90d supply, fill #1
  Filled 2023-12-31: qty 360, 90d supply, fill #2
  Filled 2024-04-09: qty 360, 90d supply, fill #3

## 2023-06-14 MED ORDER — ONDANSETRON 4 MG PO TBDP
4.0000 mg | ORAL_TABLET | Freq: Three times a day (TID) | ORAL | 0 refills | Status: DC | PRN
  Filled 2023-06-14: qty 9, 3d supply, fill #0

## 2023-06-18 ENCOUNTER — Encounter: Payer: Self-pay | Admitting: Internal Medicine

## 2023-06-18 ENCOUNTER — Ambulatory Visit: Payer: Commercial Managed Care - PPO | Admitting: Internal Medicine

## 2023-06-18 VITALS — BP 120/70 | HR 104 | Ht 67.0 in | Wt 242.6 lb

## 2023-06-18 DIAGNOSIS — Z7985 Long-term (current) use of injectable non-insulin antidiabetic drugs: Secondary | ICD-10-CM | POA: Diagnosis not present

## 2023-06-18 DIAGNOSIS — E11311 Type 2 diabetes mellitus with unspecified diabetic retinopathy with macular edema: Secondary | ICD-10-CM | POA: Insufficient documentation

## 2023-06-18 DIAGNOSIS — E785 Hyperlipidemia, unspecified: Secondary | ICD-10-CM

## 2023-06-18 DIAGNOSIS — E66812 Obesity, class 2: Secondary | ICD-10-CM | POA: Diagnosis not present

## 2023-06-18 DIAGNOSIS — Z7984 Long term (current) use of oral hypoglycemic drugs: Secondary | ICD-10-CM | POA: Diagnosis not present

## 2023-06-18 LAB — POCT GLYCOSYLATED HEMOGLOBIN (HGB A1C): Hemoglobin A1C: 6.4 % — AB (ref 4.0–5.6)

## 2023-06-18 NOTE — Progress Notes (Signed)
 Patient ID: Sarah Walls, female   DOB: 26-Dec-1960, 63 y.o.   MRN: [id number]   HPI: Sarah Walls is a 63 y.o.-year-old female, presenting for f/u for DM2, dx in ~2010, insulin -dependent since ~2015-16, now more controlled, with complications (mild DR). Last visit 6 months ago.  Interim history: No increased urination, blurry vision, chest pain. She has a history of nausea - has Meniere's ds. >> improved. She has FMLA in place if she occasionally has symptoms of this.  Reviewed HbA1c levels: Lab Results  Component Value Date   HGBA1C 6.2 12/11/2022   HGBA1C 6.1 (A) 05/29/2022   HGBA1C 6.2 (A) 01/19/2022   HGBA1C 7.8 (A) 09/15/2021   HGBA1C 8.2 (A) 04/02/2021   HGBA1C 7.9 (A) 12/17/2020   HGBA1C 7.8 (A) 08/16/2020   HGBA1C 8.9 (A) 02/13/2020   HGBA1C 8.8 (A) 03/21/2019   HGBA1C 10.1 (A) 08/10/2018   She is on: - Metformin  2000 mg with dinner >> ER 2000 mg with dinner >>  in am >> 1000 mg 2x a day - but forgetting it at night >> 2000 mg with breakfast - Jardiance  25 mg before breakfast - Trulicity  1.5 >> 3 mg weekly in a.m.- restarted 01/2020 -  >> came off 09/2022 Unfortunately, 3 weeks before last visit, she came off Jardiance  as she could not refill it We stopped Januvia  when we started Trulicity . She was previously on glipizide  twice a day. She was previously on Levemir  40 units twice a day  Pt checks her sugars >4x a day -change from Dexcom to [facility], was not able to obtain it from [facility]):  Previously:  Previously:   Lowest sugar was 80s >> 67 >> 60s >> 60; she has hypoglycemia awareness in the 90s. Highest sugar was 276 >> ... 200 >> 200s >> 200 >> 250 x1-2x.  Glucometer: AccuChek  Pt's meals are: - Breakfast: b'fast sandwich + yoghurt; boiled eggs + slice of bacon + yoghurt - Lunch: frozen meal +/- salad - Dinner: frozen meals - Snacks: zucchini bread, apple  -No CKD, last BUN/creatinine:  Lab Results  Component Value Date   BUN 13 12/29/2022   BUN 10  01/07/2022   CREATININE 0.64 12/29/2022   CREATININE 0.59 01/07/2022   Lab Results  Component Value Date   MICRALBCREAT 1.2 01/19/2022   MICRALBCREAT 5.1 03/04/2018   MICRALBCREAT 27.5 01/10/2010   MICRALBCREAT 203.4 (H) 06/03/2009   MICRALBCREAT 13.0 02/27/2008  On lisinopril .  -+ HL; last set of lipids: Lab Results  Component Value Date   CHOL 137 12/29/2022   HDL 61 12/29/2022   LDLCALC 56 12/29/2022   LDLDIRECT 140.1 10/06/2011   TRIG 109 12/29/2022   CHOLHDL 2.2 12/29/2022  On Lipitor 40.  - last eye exam was in 05/2023: stable reported + mild DR; she has a history of cataract surgery in 2016.  - no numbness and tingling in her feet.  Last foot exam 12/11/2022.  She had a 2dECHO 10/2020 >> EF 50%  - checked 2/2 chronic tachycardia. 03/08/2022: Coronary calcium  score 0.  ROS: + See HPI  I reviewed pt's medications, allergies, PMH, social hx, family hx, and changes were documented in the history of present illness. Otherwise, unchanged from my initial visit note.  Past Medical History:  Diagnosis Date   AC joint pain 08/13/2011   Allergy    Arthritis    Back pain 11/20/2010   Bipolar disorder (HCC)    Cataract    Depression    BAD2 - Dr.  Steiner   Diabetes mellitus without complication (HCC)    Hyperlipidemia    Hypertension    Panic attacks    BAD2 - Dr. 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    Social History  Main Topics   Smoking status: Never Smoker   Smokeless tobacco: Never Used   Alcohol use No   Drug use: No   Social History Narrative   05/2009 Masters in counseling in disabled.   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       Mother lived in home.  (Deceased quickly Spring 09 from metastatic melanoma)   Current Outpatient Medications on File Prior to Visit  Medication Sig Dispense Refill   ACCU-CHEK FASTCLIX LANCETS MISC Check blood 1 - 2 times daily and as directed. Dx E11.65 200 each 1   ACCU-CHEK GUIDE test strip CHECK BLOOD 1 - 2 TIMES DAILY AND AS DIRECTED. DX E11.65 200 strip 1   ALPRAZolam  (XANAX ) 1 MG tablet Take 1 tablet (1 mg total) by mouth daily as needed for severe anxiety/ panic 30 tablet 1   ALPRAZolam  (XANAX ) 1 MG tablet Take 1 tablet by mouth daily as needed for severe anxiety/ panic 15 tablet 1   atorvastatin  (LIPITOR) 80 MG tablet Take 1 tablet (80 mg total) by mouth daily at 6 PM. 90 tablet 3   buPROPion  (WELLBUTRIN  XL) 300 MG 24 hr tablet Take 1 tablet (300 mg total) by mouth daily as soon as you wake up. 90 tablet 0   carbamide peroxide (DEBROX) 6.5 % OTIC solution Place 5 drops into the left ear 2 times daily. 15 mL 0   chlorhexidine (PERIDEX) 0.12 % solution  (Patient not taking: Reported on 12/11/2022)  3   Continuous Blood Gluc Receiver (DEXCOM G6 RECEIVER) DEVI 1 Device by Does not apply route once for 1 dose. 1 each 0   Continuous Blood Gluc Transmit (DEXCOM G6 TRANSMITTER) MISC 1 Device by Does not apply route every 3 (three) months. 1 each 3   Continuous Glucose Sensor (FREESTYLE LIBRE 3 SENSOR) MISC 1 each by Does not apply route every 14 (fourteen) days. 6 each 3   Dulaglutide  (TRULICITY ) 3 MG/0.5ML SOAJ Inject 3 mg into the skin once a week. 6 mL 1   empagliflozin  (JARDIANCE ) 25 MG TABS tablet Take 1 tablet (25 mg total) by mouth daily. 90 tablet 3   Insulin  Pen Needle (UNIFINE PENTIPS) 31G X 5 MM MISC USE  DAILY WITH INSULIN  PEN 100 each 3   lisinopril  (ZESTRIL ) 10 MG tablet Take 1 tablet (10 mg total) by mouth at bedtime. 90 tablet 3   lurasidone  (LATUDA ) 20 MG TABS tablet Take 1 tablet (20 mg total) by mouth every evening with food. 30 tablet 2   lurasidone  (LATUDA ) 20 MG  TABS tablet Take 1 tablet (20 mg total) by mouth every evening with food 30 tablet 0   lurasidone  (LATUDA ) 20 MG TABS tablet Take 1 tablet (20 mg total) by mouth every evening with food 90 tablet 1   metFORMIN  (GLUCOPHAGE -XR) 500 MG 24 hr tablet Take 4 tablets by mouth daily with supper. 360 tablet 3   metoprolol  tartrate (LOPRESSOR ) 25 MG tablet Take 1 tablet by mouth 2 times daily. 60 tablet 0   Multiple Vitamin (MULTIVITAMIN) tablet Take 1 tablet by mouth daily.     ondansetron  (ZOFRAN -ODT) 4 MG disintegrating tablet Dissolve 1 tablet by mouth every 8 hours as needed for nausea or vomiting. 9 tablet 0   terbinafine  (LAMISIL ) 1 % cream Apply 1 application topically 2 (two) times daily. 30 g 1   No current facility-administered medications on file prior to visit.   Allergies  Allergen Reactions   Beta Adrenergic Blockers Other (See Comments)    Drowsy, fatigue   Family History  Problem Relation Age of Onset   Cancer Mother        Metastatic liver CA   Thyroid disease Mother        Hypothyroidism   Vision loss Mother        Eye tumor, removed orbit at the end of 2004   Melanoma Mother    Diabetes Mother    Diabetes Father    Heart disease Father        CABG, coronary disease   Thyroid disease Sister        Hypothyroidism   Colon cancer Maternal Aunt 48   Breast cancer Neg Hx    Colon polyps Neg Hx    Esophageal cancer Neg Hx    Rectal cancer Neg Hx    Stomach cancer Neg Hx    PE: BP 120/70   Pulse (!) 104   Ht 5' 7 (1.702 m)   Wt 242 lb 9.6 oz (110 kg)   LMP 08/28/2015 (Approximate)   SpO2 95%   BMI 38.00 kg/m  Wt Readings from Last 15 Encounters:  06/18/23 242 lb 9.6 oz (110 kg)  12/29/22 236 lb  (107 kg)  12/11/22 235 lb 9.6 oz (106.9 kg)  11/13/22 237 lb 6.4 oz (107.7 kg)  09/02/22 239 lb (108.4 kg)  05/29/22 238 lb 6.4 oz (108.1 kg)  04/20/22 229 lb 6.4 oz (104.1 kg)  04/02/22 231 lb 3.2 oz (104.9 kg)  02/13/22 231 lb 3.2 oz (104.9 kg)  02/04/22 234 lb (106.1 kg)  01/19/22 234 lb (106.1 kg)  01/06/22 233 lb (105.7 kg)  12/05/21 241 lb (109.3 kg)  11/06/21 245 lb 9.6 oz (111.4 kg)  09/15/21 264 lb 9.6 oz (120 kg)   Constitutional: overweight, in NAD Eyes: EOMI, no exophthalmos ENT: no thyromegaly, no cervical lymphadenopathy Cardiovascular: tachycardia, RR, No MRG Respiratory: CTA B Musculoskeletal: no deformities Skin: no rashes Neurological: no tremor with outstretched hands  ASSESSMENT: 1. DM2, insulin -dependent, now controlled, with complications - mild DR  2. Obesity class 2  3. HL  PLAN:  1. Patient with longstanding, more controlled type 2 diabetes lately, on oral antidiabetic regimen with metformin  and SGLT2 inhibitor along with weekly GLP-1 receptor agonist.  Sugars were slightly more fluctuating in the 2 weeks prior to our last visit but overall well-controlled, despite stopping insulin  2 months prior to the visit.  HbA1c was still controlled, at 6.2%, only slightly higher than before, so we continued without insulin . CGM interpretation: -At today's visit, we reviewed  her CGM downloads: It appears that 87% of values are in target range (goal >70%), while 30% are higher than 180 (goal <25%), and 0% are lower than 70 (goal <4%).  The calculated average blood sugar is 141.  The projected HbA1c for the next 3 months (GMI) is 6.7%. -Reviewing the CGM trends, sugars appear to be fluctuating mostly within the target range, with occasional hyperglycemic spikes especially after dinner.  Overall, they have been fairly stable since she started the freestyle libre 3 sensor approximately 2 to 3 months ago, but with occasional higher blood sugars during the holidays.  For  now, I did not suggest a change in regimen although the HbA1c appears to be slightly higher (see below). - I suggested to:  Patient Instructions  Please continue: - Metformin  2000 mg in am  - Jardiance  25 mg before breakfast - Trulicity  3 mg weekly  Please return in 4-6 months.   - we checked her HbA1c: 6.4% (slightly higher) - advised to check sugars at different times of the day - 4x a day, rotating check times - advised for yearly eye exams >> she is UTD - will check an ACR today - return to clinic in 4-6 months  2. Obesity class 2 -She had great success on a plant-based diet in the past but she could not continue.  We discussed about the whole 30 diet and the weight watchers diet in the past -continue SGLT 2 inhibitor and GLP-1 receptor agonist which should also help with weight loss -She lost almost 30 pounds in the last 2 years.  Since last visit, she gained 7 pounds.  3. HL -Reviewed latest lipid panel from 12/2022: Fractions at goal: Lab Results  Component Value Date   CHOL 137 12/29/2022   HDL 61 12/29/2022   LDLCALC 56 12/29/2022   LDLDIRECT 140.1 10/06/2011   TRIG 109 12/29/2022   CHOLHDL 2.2 12/29/2022  -she continues on Lipitor 40 mg daily without side effects  Lela Fendt, MD PhD [facility]

## 2023-06-18 NOTE — Addendum Note (Signed)
 Addended by: Carlus Pavlov on: 06/18/2023 09:39 AM   Modules accepted: Orders

## 2023-06-18 NOTE — Addendum Note (Signed)
 Addended by: Pollie Meyer on: 06/18/2023 09:46 AM   Modules accepted: Orders

## 2023-06-18 NOTE — Patient Instructions (Signed)
Please continue: - Metformin 2000 mg in am  - Jardiance 25 mg before breakfast - Trulicity 3 mg weekly  Please return in 4-6 months.

## 2023-06-19 LAB — MICROALBUMIN / CREATININE URINE RATIO
Creatinine, Urine: 72 mg/dL (ref 20–275)
Microalb Creat Ratio: 6 mg/g{creat} (ref <30)
Microalb, Ur: 0.4 mg/dL

## 2023-06-26 ENCOUNTER — Other Ambulatory Visit: Payer: Self-pay | Admitting: Cardiovascular Disease

## 2023-06-28 ENCOUNTER — Other Ambulatory Visit (HOSPITAL_COMMUNITY): Payer: Self-pay

## 2023-06-28 ENCOUNTER — Other Ambulatory Visit: Payer: Self-pay

## 2023-06-28 MED ORDER — METOPROLOL TARTRATE 25 MG PO TABS
25.0000 mg | ORAL_TABLET | Freq: Two times a day (BID) | ORAL | 3 refills | Status: AC
  Filled 2023-06-28 – 2023-07-21 (×2): qty 180, 90d supply, fill #0
  Filled 2023-12-31: qty 180, 90d supply, fill #1
  Filled 2024-05-24: qty 180, 90d supply, fill #2

## 2023-07-06 ENCOUNTER — Other Ambulatory Visit (HOSPITAL_COMMUNITY): Payer: Self-pay

## 2023-07-07 ENCOUNTER — Other Ambulatory Visit (HOSPITAL_COMMUNITY): Payer: Self-pay

## 2023-07-08 ENCOUNTER — Other Ambulatory Visit: Payer: Self-pay

## 2023-07-08 ENCOUNTER — Other Ambulatory Visit (HOSPITAL_COMMUNITY): Payer: Self-pay

## 2023-07-20 ENCOUNTER — Other Ambulatory Visit (HOSPITAL_COMMUNITY): Payer: Self-pay

## 2023-07-21 ENCOUNTER — Other Ambulatory Visit: Payer: Self-pay

## 2023-07-21 ENCOUNTER — Other Ambulatory Visit: Payer: Self-pay | Admitting: Family Medicine

## 2023-07-21 ENCOUNTER — Other Ambulatory Visit (HOSPITAL_COMMUNITY): Payer: Self-pay

## 2023-07-21 DIAGNOSIS — I10 Essential (primary) hypertension: Secondary | ICD-10-CM

## 2023-07-21 MED ORDER — LISINOPRIL 10 MG PO TABS
10.0000 mg | ORAL_TABLET | Freq: Every day | ORAL | 3 refills | Status: AC
  Filled 2023-07-21: qty 90, 90d supply, fill #0
  Filled 2023-12-31: qty 90, 90d supply, fill #1
  Filled 2024-05-24: qty 90, 90d supply, fill #2

## 2023-08-05 ENCOUNTER — Other Ambulatory Visit (HOSPITAL_COMMUNITY): Payer: Self-pay

## 2023-08-05 ENCOUNTER — Other Ambulatory Visit: Payer: Self-pay | Admitting: Family Medicine

## 2023-08-05 ENCOUNTER — Other Ambulatory Visit: Payer: Self-pay

## 2023-08-05 DIAGNOSIS — R42 Dizziness and giddiness: Secondary | ICD-10-CM

## 2023-08-05 MED ORDER — ONDANSETRON 4 MG PO TBDP
4.0000 mg | ORAL_TABLET | Freq: Three times a day (TID) | ORAL | 0 refills | Status: DC | PRN
Start: 2023-08-05 — End: 2023-11-22
  Filled 2023-08-05: qty 9, 3d supply, fill #0

## 2023-08-24 ENCOUNTER — Encounter: Payer: Self-pay | Admitting: Family Medicine

## 2023-08-27 ENCOUNTER — Ambulatory Visit: Payer: Commercial Managed Care - PPO | Admitting: Family Medicine

## 2023-08-27 ENCOUNTER — Encounter: Payer: Self-pay | Admitting: Family Medicine

## 2023-08-27 ENCOUNTER — Other Ambulatory Visit (HOSPITAL_COMMUNITY): Payer: Self-pay

## 2023-08-27 VITALS — BP 131/86 | HR 85 | Ht 67.0 in | Wt 245.2 lb

## 2023-08-27 DIAGNOSIS — E113291 Type 2 diabetes mellitus with mild nonproliferative diabetic retinopathy without macular edema, right eye: Secondary | ICD-10-CM

## 2023-08-27 DIAGNOSIS — B351 Tinea unguium: Secondary | ICD-10-CM

## 2023-08-27 DIAGNOSIS — H8102 Meniere's disease, left ear: Secondary | ICD-10-CM | POA: Diagnosis not present

## 2023-08-27 DIAGNOSIS — F900 Attention-deficit hyperactivity disorder, predominantly inattentive type: Secondary | ICD-10-CM | POA: Diagnosis not present

## 2023-08-27 DIAGNOSIS — E1169 Type 2 diabetes mellitus with other specified complication: Secondary | ICD-10-CM

## 2023-08-27 DIAGNOSIS — F410 Panic disorder [episodic paroxysmal anxiety] without agoraphobia: Secondary | ICD-10-CM | POA: Diagnosis not present

## 2023-08-27 DIAGNOSIS — Z794 Long term (current) use of insulin: Secondary | ICD-10-CM

## 2023-08-27 DIAGNOSIS — F3181 Bipolar II disorder: Secondary | ICD-10-CM | POA: Diagnosis not present

## 2023-08-27 MED ORDER — LURASIDONE HCL 40 MG PO TABS
40.0000 mg | ORAL_TABLET | Freq: Every day | ORAL | 0 refills | Status: DC
  Filled 2023-08-27: qty 90, 90d supply, fill #0

## 2023-08-27 MED ORDER — ALPRAZOLAM 1 MG PO TABS
1.0000 mg | ORAL_TABLET | Freq: Every day | ORAL | 1 refills | Status: DC | PRN
  Filled 2023-08-27: qty 15, 15d supply, fill #0

## 2023-08-27 MED ORDER — BUPROPION HCL ER (XL) 300 MG PO TB24
300.0000 mg | ORAL_TABLET | Freq: Every morning | ORAL | 0 refills | Status: DC
  Filled 2023-08-27: qty 90, 90d supply, fill #0

## 2023-08-27 MED ORDER — TERBINAFINE HCL 250 MG PO TABS
250.0000 mg | ORAL_TABLET | Freq: Every day | ORAL | 0 refills | Status: DC
  Filled 2023-08-27: qty 84, 84d supply, fill #0

## 2023-08-27 NOTE — Progress Notes (Signed)
    SUBJECTIVE:   CHIEF COMPLAINT / HPI:   Annual FMLA renewal Sarah Walls suffers from Meniere's disease as a condition of the inner ear which can cause vertigo, nausea, balance issues and blurry vision.  She has intermittent episodes which require her to rest and use medications during which it is unsafe for her to drive and she is not able to work at a computer.  She has intermittent FMLA leave needs to be renewed yearly.  Her flares are infrequent and she is typically able to manage them well but she does occasionally still have severe flares which require longer periods of time to recover from.  Onychomycosis of the toenails First noticed approximately 1 year ago, assumed it was simply discoloration due to aging.  Was informed at most recent endocrinology visit that this is actually an infection of the nail with fungus, and due to her diabetes she should have this treated right away.  Has not noticed any pain swelling or irritation, just nail thickening and yellow discoloration.  She denies any systemic signs such as fever or chills.  Spread to the fingernails.  PERTINENT  PMH / PSH: Type 2 diabetes, Mnire's disease  OBJECTIVE:   BP 131/86   Pulse 85   Ht 5\' 7"  (1.702 m)   Wt 245 lb 3.2 oz (111.2 kg)   LMP 08/28/2015 (Approximate)   SpO2 95%   BMI 38.40 kg/m   General: A&O, NAD HEENT: No sign of trauma, EOM grossly intact Cardiac: RRR, no m/r/g Respiratory: CTAB, normal WOB, no w/c/r Remedies: Thickened yellow bilateral nails of the great toes, on the right foot second toe and pinky toe are also yellowed and thickened, on the left foot third digit and fourth digit also have thickened yellowed nails  ASSESSMENT/PLAN:   Meniere disease FMLA paperwork completed, will place in faxing Q today to be sent to appropriate agency.  Onychomycosis of multiple toenails with type 2 diabetes mellitus (HCC) Prescribed oral terbinafine 250 mg to be taken daily the next 12 weeks.  Advised  that given this will be a longer term treatment we will plan to check her liver function in 6 weeks.  She will make a lab only visit around that time.  Will place the CMP as a future order today   Gerrit Heck, DO [facility]

## 2023-08-27 NOTE — Assessment & Plan Note (Signed)
 Prescribed oral terbinafine 250 mg to be taken daily the next 12 weeks.  Advised that given this will be a longer term treatment we will plan to check her liver function in 6 weeks.  She will make a lab only visit around that time.  Will place the CMP as a future order today

## 2023-08-27 NOTE — Assessment & Plan Note (Signed)
 FMLA paperwork completed, will place in faxing Q today to be sent to appropriate agency.

## 2023-08-27 NOTE — Patient Instructions (Signed)
 It was wonderful to see you today!  Today we completed your FMLA renewal paperwork. I will have it faxed out for you today.  We also looked at your toenails. You have a common condition called onychomycosis, a fungal infection of the toenail. We will treat you with oral terbinafine (lamisil) 250mg  daily for 12 weeks. To prevent reoccurance, you can use goldbond powder in your shoes, change socks frequently, and make sure you dry your feet thoroughly after bathing. Onychomycosis often takes a long time to resolve, so please do not be discouraged or worried if your nails are thick or yellowed for a long period of time, even after we start treatment.   Please call [phone] with any questions about today's appointment.   If you need any additional refills, please call your pharmacy before calling the office.  Gerrit Heck, DO Family Medicine

## 2023-09-25 DIAGNOSIS — E119 Type 2 diabetes mellitus without complications: Secondary | ICD-10-CM | POA: Diagnosis not present

## 2023-10-08 ENCOUNTER — Other Ambulatory Visit: Payer: Self-pay

## 2023-10-12 ENCOUNTER — Other Ambulatory Visit

## 2023-10-12 DIAGNOSIS — B351 Tinea unguium: Secondary | ICD-10-CM

## 2023-10-12 DIAGNOSIS — E1169 Type 2 diabetes mellitus with other specified complication: Secondary | ICD-10-CM | POA: Diagnosis not present

## 2023-10-13 LAB — COMPREHENSIVE METABOLIC PANEL WITH GFR
ALT: 40 IU/L — ABNORMAL HIGH (ref 0–32)
AST: 23 IU/L (ref 0–40)
Albumin: 4.3 g/dL (ref 3.9–4.9)
Alkaline Phosphatase: 62 IU/L (ref 44–121)
BUN/Creatinine Ratio: 11 — ABNORMAL LOW (ref 12–28)
BUN: 7 mg/dL — ABNORMAL LOW (ref 8–27)
Bilirubin Total: 0.3 mg/dL (ref 0.0–1.2)
CO2: 22 mmol/L (ref 20–29)
Calcium: 9.4 mg/dL (ref 8.7–10.3)
Chloride: 105 mmol/L (ref 96–106)
Creatinine, Ser: 0.61 mg/dL (ref 0.57–1.00)
Globulin, Total: 1.9 g/dL (ref 1.5–4.5)
Glucose: 138 mg/dL — ABNORMAL HIGH (ref 70–99)
Potassium: 4.4 mmol/L (ref 3.5–5.2)
Sodium: 142 mmol/L (ref 134–144)
Total Protein: 6.2 g/dL (ref 6.0–8.5)
eGFR: 99 mL/min/{1.73_m2} (ref >59)

## 2023-10-14 ENCOUNTER — Encounter: Payer: Self-pay | Admitting: Family Medicine

## 2023-10-27 ENCOUNTER — Other Ambulatory Visit: Payer: Self-pay | Admitting: Internal Medicine

## 2023-10-27 ENCOUNTER — Other Ambulatory Visit (HOSPITAL_COMMUNITY): Payer: Self-pay

## 2023-10-27 MED ORDER — TRULICITY 3 MG/0.5ML ~~LOC~~ SOAJ
3.0000 mg | SUBCUTANEOUS | 1 refills | Status: DC
  Filled 2023-10-27: qty 2, 28d supply, fill #0
  Filled 2023-11-06 – 2023-11-22 (×2): qty 6, 84d supply, fill #0

## 2023-11-05 ENCOUNTER — Other Ambulatory Visit (HOSPITAL_COMMUNITY): Payer: Self-pay

## 2023-11-06 ENCOUNTER — Other Ambulatory Visit (HOSPITAL_COMMUNITY): Payer: Self-pay

## 2023-11-13 ENCOUNTER — Other Ambulatory Visit (HOSPITAL_COMMUNITY): Payer: Self-pay

## 2023-11-19 ENCOUNTER — Other Ambulatory Visit (HOSPITAL_COMMUNITY): Payer: Self-pay

## 2023-11-22 ENCOUNTER — Other Ambulatory Visit: Payer: Self-pay

## 2023-11-22 ENCOUNTER — Other Ambulatory Visit: Payer: Self-pay | Admitting: Family Medicine

## 2023-11-22 DIAGNOSIS — R42 Dizziness and giddiness: Secondary | ICD-10-CM

## 2023-11-22 DIAGNOSIS — E1169 Type 2 diabetes mellitus with other specified complication: Secondary | ICD-10-CM

## 2023-11-22 MED ORDER — TERBINAFINE HCL 250 MG PO TABS
250.0000 mg | ORAL_TABLET | Freq: Every day | ORAL | 0 refills | Status: AC
  Filled 2023-11-22: qty 84, 84d supply, fill #0

## 2023-11-22 MED ORDER — ONDANSETRON 4 MG PO TBDP
4.0000 mg | ORAL_TABLET | Freq: Three times a day (TID) | ORAL | 0 refills | Status: DC | PRN
  Filled 2023-11-22: qty 9, 3d supply, fill #0

## 2023-11-23 ENCOUNTER — Other Ambulatory Visit (HOSPITAL_COMMUNITY): Payer: Self-pay

## 2023-11-26 ENCOUNTER — Other Ambulatory Visit (HOSPITAL_COMMUNITY): Payer: Self-pay

## 2023-11-29 ENCOUNTER — Other Ambulatory Visit: Payer: Self-pay

## 2023-11-29 ENCOUNTER — Other Ambulatory Visit (HOSPITAL_COMMUNITY): Payer: Self-pay

## 2023-11-29 MED ORDER — BUPROPION HCL ER (XL) 300 MG PO TB24
ORAL_TABLET | ORAL | 0 refills | Status: DC
  Filled 2023-11-29: qty 90, 90d supply, fill #0

## 2023-11-29 MED ORDER — LURASIDONE HCL 40 MG PO TABS
40.0000 mg | ORAL_TABLET | Freq: Every day | ORAL | 0 refills | Status: DC
  Filled 2023-11-29: qty 90, 90d supply, fill #0

## 2023-12-24 ENCOUNTER — Ambulatory Visit: Payer: Commercial Managed Care - PPO | Admitting: Internal Medicine

## 2023-12-24 ENCOUNTER — Other Ambulatory Visit (HOSPITAL_COMMUNITY): Payer: Self-pay

## 2023-12-24 ENCOUNTER — Encounter: Payer: Self-pay | Admitting: Internal Medicine

## 2023-12-24 VITALS — BP 124/72 | HR 86 | Ht 67.0 in | Wt 242.8 lb

## 2023-12-24 DIAGNOSIS — Z7985 Long-term (current) use of injectable non-insulin antidiabetic drugs: Secondary | ICD-10-CM

## 2023-12-24 DIAGNOSIS — E66812 Obesity, class 2: Secondary | ICD-10-CM | POA: Diagnosis not present

## 2023-12-24 DIAGNOSIS — Z7984 Long term (current) use of oral hypoglycemic drugs: Secondary | ICD-10-CM

## 2023-12-24 DIAGNOSIS — E11311 Type 2 diabetes mellitus with unspecified diabetic retinopathy with macular edema: Secondary | ICD-10-CM | POA: Diagnosis not present

## 2023-12-24 DIAGNOSIS — E785 Hyperlipidemia, unspecified: Secondary | ICD-10-CM | POA: Diagnosis not present

## 2023-12-24 LAB — POCT GLYCOSYLATED HEMOGLOBIN (HGB A1C): Hemoglobin A1C: 6.5 % — AB (ref 4.0–5.6)

## 2023-12-24 MED ORDER — EMPAGLIFLOZIN 25 MG PO TABS
25.0000 mg | ORAL_TABLET | Freq: Every day | ORAL | 3 refills | Status: AC
  Filled 2023-12-24 – 2024-02-11 (×2): qty 90, 90d supply, fill #0
  Filled 2024-05-24: qty 90, 90d supply, fill #1
  Filled 2024-05-27: qty 90, 90d supply, fill #0

## 2023-12-24 MED ORDER — TRULICITY 3 MG/0.5ML ~~LOC~~ SOAJ
3.0000 mg | SUBCUTANEOUS | 3 refills | Status: AC
  Filled 2023-12-24 – 2024-02-13 (×2): qty 6, 84d supply, fill #0
  Filled 2024-04-09 – 2024-05-16 (×4): qty 6, 84d supply, fill #1

## 2023-12-24 NOTE — Progress Notes (Signed)
 Patient ID: Sarah Walls, female   DOB: [birth year], 63 y.o.   MRN: [id number]   HPI: Sarah Walls is a 63 y.o.-year-old female, presenting for f/u for DM2, dx in ~2010, prev. 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 Last visit 6 months ago.  Interim history: No increased urination, blurry vision, chest pain. She has a history of nausea - has Meniere's ds. >> improved.  However, she still experiences nausea especially when sugars are in the 80s. She had an URI approximately 1.5 months ago and sugars were higher at that time.  She also had a period of time of 3 weeks when she could not refill Trulicity  around the same time. She does report blurry vision when working long hours on the computer.  She usually works 12-hour shifts, but yesterday worked for 16 hours.  She does wear glasses while working.  Reviewed HbA1c levels: Lab Results  Component Value Date   HGBA1C 6.4 (A) 06/18/2023   HGBA1C 6.2 12/11/2022   HGBA1C 6.1 (A) 05/29/2022   HGBA1C 6.2 (A) 01/19/2022   HGBA1C 7.8 (A) 09/15/2021   HGBA1C 8.2 (A) 04/02/2021   HGBA1C 7.9 (A) 12/17/2020   HGBA1C 7.8 (A) 08/16/2020   HGBA1C 8.9 (A) 02/13/2020   HGBA1C 8.8 (A) 03/21/2019   She is on: - Metformin  2000 mg with dinner >> ER ... - but forgetting it at night >> 2000 mg with breakfast - Jardiance  25 mg before breakfast - Trulicity  1.5 >> 3 mg weekly in a.m.- restarted 01/2020 Unfortunately, 3 weeks before last visit, she came off Jardiance  as she could not refill it We stopped Januvia  when we started Trulicity . She was previously on glipizide  twice a day. She was previously on Levemir  40 units twice a day.  Then Toujeo  60 units in a.m. - gradually decreased the dose and finally came off 09/2022.  Pt checks her sugars >4x a day -change from Dexcom to [facility], not able to obtain it from [facility]):  Previously:  Previously:  Lowest sugar was 60s >> 60 >> 72; she  has hypoglycemia awareness in the 80s Highest sugar was 276 >> ... 250 x1-2x >> 250 (sick)  Glucometer: AccuChek  Pt's meals are: - Breakfast: b'fast sandwich + yoghurt; boiled eggs + slice of bacon + yoghurt - Lunch: frozen meal +/- salad - Dinner: frozen meals - Snacks: zucchini bread, apple  -No CKD, last BUN/creatinine:  Lab Results  Component Value Date   BUN 7 (L) 10/12/2023   BUN 13 12/29/2022   CREATININE 0.61 10/12/2023   CREATININE 0.64 12/29/2022   Lab Results  Component Value Date   MICRALBCREAT 6 06/18/2023   MICRALBCREAT 27.5 01/10/2010   MICRALBCREAT 203.4 (H) 06/03/2009   MICRALBCREAT 13.0 02/27/2008  On lisinopril .  -+ HL; last set of lipids: Lab Results  Component Value Date   CHOL 137 12/29/2022   HDL 61 12/29/2022   LDLCALC 56 12/29/2022   LDLDIRECT 140.1 10/06/2011   TRIG 109 12/29/2022   CHOLHDL 2.2 12/29/2022  On Lipitor 40.  - last eye exam was in 05/2023: stable reported + mild DR; she has a history of cataract surgery in 2016.  - no numbness and tingling in her feet.  Last foot exam 12/11/2022.  She was on Lamisil  for toe fungus >> resolved.  She had a 2dECHO 10/2020 >> EF 50%  - checked 2/2 chronic tachycardia. 03/08/2022: Coronary calcium  score 0.  ROS: + See HPI  I reviewed  pt's medications, allergies, PMH, social hx, family hx, and changes were documented in the history of present illness. Otherwise, unchanged from my initial visit note.  Past Medical History:  Diagnosis Date   AC joint pain 08/13/2011   Allergy    Arthritis    Back pain 11/20/2010   Bipolar disorder (HCC)    Cataract    Depression    BAD2 - Dr. Lucyann   Diabetes mellitus without complication (HCC)    Hyperlipidemia    Hypertension    Panic attacks    BAD2 - Dr. 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    Social History Main Topics   Smoking status: Never Smoker   Smokeless tobacco: Never Used   Alcohol use No   Drug use: No   Social History Narrative   05/2009 Masters in counseling in disabled.   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       Mother lived in home.  (Deceased quickly Spring 09 from metastatic melanoma)   Current Outpatient Medications on File Prior to Visit  Medication Sig Dispense Refill   ACCU-CHEK FASTCLIX LANCETS MISC Check blood 1 - 2 times daily and as directed. Dx E11.65 (Patient not taking: Reported on 06/18/2023) 200 each 1   ACCU-CHEK GUIDE test strip CHECK BLOOD 1 - 2 TIMES DAILY AND AS DIRECTED. DX E11.65 (Patient not taking: Reported on 06/18/2023) 200 strip 1   ALPRAZolam  (XANAX ) 1 MG tablet Take 1 tablet (1 mg total) by mouth daily as needed for severe anxiety/ panic 30 tablet 1   ALPRAZolam  (XANAX ) 1 MG tablet Take 1 tablet by mouth daily as needed for severe anxiety/ panic 15 tablet 1   ALPRAZolam  (XANAX ) 1 MG tablet Take 1 tablet (1 mg total) by mouth daily as needed for severe anxiety or panic 15 tablet 1   atorvastatin  (LIPITOR) 80 MG tablet Take 1 tablet (80 mg total) by mouth daily at 6 PM. 90 tablet 3   buPROPion  (WELLBUTRIN  XL) 300 MG 24 hr tablet Take 1 tablet (300 mg total) by mouth in the morning as soon as you wake up. 90 tablet 0   carbamide peroxide (DEBROX) 6.5 % OTIC solution Place 5 drops into the left ear 2 times daily. 15 mL 0   chlorhexidine (PERIDEX) 0.12 % solution  (Patient not taking: Reported on  06/18/2023)  3   Continuous Glucose Sensor (FREESTYLE LIBRE 3 SENSOR) MISC 1 each by Does not apply route every 14 (fourteen) days. 6 each 3   Dulaglutide  (TRULICITY ) 3 MG/0.5ML SOAJ Inject 3 mg into the skin once a week. 6 mL 1   empagliflozin  (JARDIANCE ) 25 MG TABS tablet Take 1 tablet (25 mg total) by mouth daily. 90 tablet 3   Insulin  Pen Needle (UNIFINE PENTIPS)  31G X 5 MM MISC USE DAILY WITH INSULIN  PEN 100 each 3   lisinopril  (ZESTRIL ) 10 MG tablet Take 1 tablet (10 mg total) by mouth at bedtime. 90 tablet 3   lurasidone  (LATUDA ) 20 MG TABS tablet Take 1 tablet (20 mg total) by mouth every evening with food. 30 tablet 2   lurasidone  (LATUDA ) 20 MG TABS tablet Take 1 tablet (20 mg total) by mouth every evening with food 30 tablet 0   lurasidone  (LATUDA ) 20 MG TABS tablet Take 1 tablet (20 mg total) by mouth every evening with food 90 tablet 1   lurasidone  (LATUDA ) 40 MG TABS tablet Take 1 tablet (40 mg total) by mouth daily. 90 tablet 0   metFORMIN  (GLUCOPHAGE -XR) 500 MG 24 hr tablet Take 4 tablets by mouth daily with supper. 360 tablet 3   metoprolol  tartrate (LOPRESSOR ) 25 MG tablet Take 1 tablet by mouth 2 times daily. 180 tablet 3   Multiple Vitamin (MULTIVITAMIN) tablet Take 1 tablet by mouth daily.     ondansetron  (ZOFRAN -ODT) 4 MG disintegrating tablet Dissolve 1 tablet by mouth every 8 hours as needed for nausea or vomiting. 9 tablet 0   terbinafine  (LAMISIL ) 1 % cream Apply 1 application topically 2 (two) times daily. (Patient not taking: Reported on 06/18/2023) 30 g 1   terbinafine  (LAMISIL ) 250 MG tablet Take 1 tablet (250 mg total) by mouth daily. 84 tablet 0   No current facility-administered medications on file prior to visit.   Allergies  Allergen Reactions   Beta Adrenergic Blockers Other (See Comments)    Drowsy, fatigue   Family History  Problem Relation Age of Onset   Cancer Mother        Metastatic liver CA   Thyroid disease Mother        Hypothyroidism   Vision  loss Mother        Eye tumor, removed orbit at the end of 2004   Melanoma Mother    Diabetes Mother    Diabetes Father    Heart disease Father        CABG, coronary disease   Thyroid disease Sister        Hypothyroidism   Colon cancer Maternal Aunt 11   Breast cancer Neg Hx    Colon polyps Neg Hx    Esophageal cancer Neg Hx    Rectal cancer Neg Hx    Stomach cancer Neg Hx    PE: BP 124/72   Pulse 86   Ht 5' 7 (1.702 m)   Wt 242 lb 12.8 oz (110.1 kg)   LMP 08/28/2015 (Approximate)   SpO2 95%   BMI 38.03 kg/m  Wt Readings from Last 15 Encounters:  12/24/23 242 lb 12.8 oz (110.1 kg)  08/27/23 245 lb 3.2 oz (111.2 kg)  06/18/23 242 lb 9.6 oz (110 kg)  12/29/22 236 lb (107 kg)  12/11/22 235 lb 9.6 oz (106.9 kg)  11/13/22 237 lb 6.4 oz (107.7 kg)  09/02/22 239 lb (108.4 kg)  05/29/22 238 lb 6.4 oz (108.1 kg)  04/20/22 229 lb 6.4 oz (104.1 kg)  04/02/22 231 lb 3.2 oz (104.9 kg)  02/13/22 231 lb 3.2 oz (104.9 kg)  02/04/22 234 lb (106.1 kg)  01/19/22 234 lb (106.1 kg)  01/06/22 233 lb (105.7 kg)  12/05/21 241 lb (109.3 kg)   Constitutional: overweight, in NAD Eyes: EOMI, no exophthalmos ENT: no thyromegaly, no cervical lymphadenopathy Cardiovascular: RRR, No MRG Respiratory: CTA B Musculoskeletal: no deformities Skin: no rashes  Neurological: no tremor with outstretched hands Diabetic Foot Exam - Simple   Simple Foot Form Diabetic Foot exam was performed with the following findings: Yes 12/24/2023  9:14 AM  Visual Inspection No deformities, no ulcerations, no other skin breakdown bilaterally: Yes Sensation Testing Intact to touch and monofilament testing bilaterally: Yes Pulse Check Posterior Tibialis and Dorsalis pulse intact bilaterally: Yes Comments    ASSESSMENT: 1. DM2, non insulin -dependent, now controlled, with complications - mild DR  2. Obesity class 2  3. HL  PLAN:  1. Patient with longstanding, controlled, type 2 diabetes, on oral antidiabetic  regimen with metformin  and SGLT2 inhibitor and also weekly GLP-1 receptor agonist.  She was previously on insulin  but was able to come off due to good control.  At last visit, HbA1c was slightly higher, at 6.4%, increased from 6.2%, but still at goal.  Sugars appears to be fluctuating mostly within the target range with only occasional hyperglycemic spikes especially after dinner but overall with good control.  We did not change her regimen at that time. CGM interpretation: -At today's visit, we reviewed her CGM downloads: It appears that 93% of values are in target range (goal >70%), while 7% are higher than 180 (goal <25%), and 0% are lower than 70 (goal <4%).  The calculated average blood sugar is 133.  The projected HbA1c for the next 3 months (GMI) is 6.5%. -Reviewing the CGM trends, sugars appear to be fairly well-controlled, with only occasional hyperglycemic spikes especially after breakfast, but with the vast majority of the blood sugars fluctuating within the target range.  I did not suggest to change her regimen for now.  Her HbA1c appears to be slightly higher today, possibly due to her higher blood sugars while off Trulicity  and during her URI.  I refilled her Jardiance  and Trulicity  today. - I suggested to:  Patient Instructions  Please continue: - Metformin  2000 mg in am  - Jardiance  25 mg before breakfast - Trulicity  3 mg weekly  Please return in 4-6 months.   - we checked her HbA1c: 7%  - advised to check sugars at different times of the day - 4x a day, rotating check times - advised for yearly eye exams >> she is UTD - return to clinic in 4-6 months  2. Obesity class 2 -She had great success on a plant-based diet in the past but she could not continue.  We discussed about the whole 30 diet and the weight watchers diet in the past - continue SGLT 2 inhibitor and GLP-1 receptor agonist which should also help with weight loss - She gained 7 pounds before last visit, previously  lost 30 pounds in 2 years - Since last visit, it is approximately stable plan  3. HL - Reviewed latest lipid panel from 12/2022: All fractions at goal: Lab Results  Component Value Date   CHOL 137 12/29/2022   HDL 61 12/29/2022   LDLCALC 56 12/29/2022   LDLDIRECT 140.1 10/06/2011   TRIG 109 12/29/2022   CHOLHDL 2.2 12/29/2022  -she continues on Lipitor 40 mg daily without side effects  Lela Fendt, MD PhD [facility] Endocrinology

## 2023-12-24 NOTE — Patient Instructions (Signed)
Please continue: - Metformin 2000 mg in am  - Jardiance 25 mg before breakfast - Trulicity 3 mg weekly  Please return in 4-6 months.

## 2023-12-31 ENCOUNTER — Other Ambulatory Visit: Payer: Self-pay | Admitting: Family Medicine

## 2023-12-31 ENCOUNTER — Other Ambulatory Visit: Payer: Self-pay

## 2023-12-31 ENCOUNTER — Other Ambulatory Visit (HOSPITAL_COMMUNITY): Payer: Self-pay

## 2023-12-31 DIAGNOSIS — R42 Dizziness and giddiness: Secondary | ICD-10-CM

## 2023-12-31 DIAGNOSIS — H1131 Conjunctival hemorrhage, right eye: Secondary | ICD-10-CM | POA: Diagnosis not present

## 2023-12-31 MED ORDER — ONDANSETRON 4 MG PO TBDP
4.0000 mg | ORAL_TABLET | Freq: Three times a day (TID) | ORAL | 0 refills | Status: DC | PRN
  Filled 2023-12-31: qty 9, 3d supply, fill #0

## 2024-01-07 DIAGNOSIS — E119 Type 2 diabetes mellitus without complications: Secondary | ICD-10-CM | POA: Diagnosis not present

## 2024-01-25 ENCOUNTER — Telehealth: Payer: Self-pay | Admitting: Pharmacist

## 2024-01-25 NOTE — Telephone Encounter (Signed)
 Patient contacted for follow-up of switch from [place] to Libre3+ sensors.   Since last contact patient reports doing well.  Patient fills Libre3+ sensors through [facility] - unable to send prescription in at this time. Patient informed of sensor switch and plans to contact Endocrinologist to send in new prescription. Patient reports recent fill of 3 month supply so should be covered through November.  Total time with patient call and documentation of interaction: 4 minutes.

## 2024-01-26 NOTE — Telephone Encounter (Signed)
 Reviewed and agree with Dr Rennis plan.

## 2024-02-08 ENCOUNTER — Ambulatory Visit (INDEPENDENT_AMBULATORY_CARE_PROVIDER_SITE_OTHER): Admitting: Family Medicine

## 2024-02-08 ENCOUNTER — Encounter: Payer: Self-pay | Admitting: Family Medicine

## 2024-02-08 VITALS — BP 129/77 | HR 90 | Ht 67.0 in | Wt 242.8 lb

## 2024-02-08 DIAGNOSIS — Z23 Encounter for immunization: Secondary | ICD-10-CM

## 2024-02-08 DIAGNOSIS — E782 Mixed hyperlipidemia: Secondary | ICD-10-CM | POA: Diagnosis not present

## 2024-02-08 DIAGNOSIS — I10 Essential (primary) hypertension: Secondary | ICD-10-CM | POA: Diagnosis not present

## 2024-02-08 DIAGNOSIS — Z0000 Encounter for general adult medical examination without abnormal findings: Secondary | ICD-10-CM

## 2024-02-08 NOTE — Assessment & Plan Note (Signed)
 BP at goal, repeat CMET today for ongoing monitoring

## 2024-02-08 NOTE — Patient Instructions (Signed)
 It was wonderful to see you today!  Today was your annual wellness exam.  Overall you are doing very well and I am pleased with your A1c.  Please follow-up with your endocrinologist regarding your questions about GLP-1 therapy.  If she is not comfortable prescribing Mounjaro for weight loss please make an appointment with my office and we will take over management of that medication.  For your mammogram and DEX[facility] scan [facility].  There will be a handout at the back of your packet today that gives you directions.  Please call them and schedule that appointment whenever is convenient for you.  If there is anything abnormal on your DEXA scan you will receive a phone call from me to schedule an appointment to come in and discuss.  Unless you need anything in the interim I will see you again in March for your FMLA paperwork appointment.  Please call [phone] with any questions about today's appointment.   If you need any additional refills, please call your pharmacy before calling the office.  Sarah Pinal, DO Family Medicine

## 2024-02-08 NOTE — Assessment & Plan Note (Signed)
 See below

## 2024-02-08 NOTE — Progress Notes (Signed)
    SUBJECTIVE:   Chief compliant/HPI: annual examination  Sarah Walls is a 63 y.o. who presents today for an annual exam.   History tabs reviewed and updated.   Patient's diabetes is managed through endocrinology.  She does express a desire to lose more weight.  Discussed switching GLP-1 therapies to either Ozempic or Mounjaro while she still has private insurance to maximize their weight loss benefit.  She will reach out to her endocrinologist discussed switching therapies.  Mnire's disease is well-managed symptomatically.  She is still only having 1-2 flares per month which are covered by her FMLA paperwork.  Not due for renewal until March.  Review of systems form reviewed and notable for none.   OBJECTIVE:   BP 129/77   Pulse 90   Ht 5' 7 (1.702 m)   Wt 242 lb 12.8 oz (110.1 kg)   LMP 08/28/2015 (Approximate)   SpO2 97%   BMI 38.03 kg/m   General: A&O, NAD HEENT: No sign of trauma, EOM grossly intact Cardiac: RRR, no m/r/g Respiratory: CTAB, normal WOB, no w/c/r GI: Soft, NTTP, non-distended  Extremities: NTTP, no peripheral edema.  ASSESSMENT/PLAN:   Assessment & Plan Essential hypertension BP at goal, repeat CMET today for ongoing monitoring Mixed hyperlipidemia Repeat lipid panel today for monitoring, follow as needed.  Annual physical exam See below  Annual Examination  See AVS for age appropriate recommendations  BP reviewed and at goal 129/77.   Considered the following items based upon USPSTF recommendations: Osteoporosis screening considered based upon risk of fracture from [facility] calculator. Major osteoporotic fracture risk is 5%. DEXA ordered.  Reviewed risk factors for latent tuberculosis and not indicated  Breast cancer screening: discussed and ordered mammogram based upon personal history  Colorectal cancer screening: up to date on screening for CRC. Lung cancer screening: Never smoker, not indicated. See documentation below regarding  indications/risks/benefits.  Vaccinations needs flu/shingrix , will discuss follow up nurse visit.   Follow up in 1 year or sooner if indicated.  MyChart Activation: Already signed up  Lucie Pinal, DO [facility]

## 2024-02-08 NOTE — Assessment & Plan Note (Signed)
 Repeat lipid panel today for monitoring, follow as needed.

## 2024-02-09 ENCOUNTER — Ambulatory Visit: Payer: Self-pay | Admitting: Family Medicine

## 2024-02-09 LAB — CMP14+EGFR
ALT: 32 IU/L (ref 0–32)
AST: 19 IU/L (ref 0–40)
Albumin: 4.4 g/dL (ref 3.9–4.9)
Alkaline Phosphatase: 59 IU/L (ref 44–121)
BUN/Creatinine Ratio: 13 (ref 12–28)
BUN: 10 mg/dL (ref 8–27)
Bilirubin Total: 0.4 mg/dL (ref 0.0–1.2)
CO2: 22 mmol/L (ref 20–29)
Calcium: 10.2 mg/dL (ref 8.7–10.3)
Chloride: 102 mmol/L (ref 96–106)
Creatinine, Ser: 0.75 mg/dL (ref 0.57–1.00)
Globulin, Total: 2.1 g/dL (ref 1.5–4.5)
Glucose: 136 mg/dL — ABNORMAL HIGH (ref 70–99)
Potassium: 4.7 mmol/L (ref 3.5–5.2)
Sodium: 141 mmol/L (ref 134–144)
Total Protein: 6.5 g/dL (ref 6.0–8.5)
eGFR: 88 mL/min/1.73 (ref >59)

## 2024-02-09 LAB — LIPID PANEL
Chol/HDL Ratio: 2 ratio (ref 0.0–4.4)
Cholesterol, Total: 131 mg/dL (ref 100–199)
HDL: 66 mg/dL (ref >39)
LDL Chol Calc (NIH): 49 mg/dL (ref 0–99)
Triglycerides: 84 mg/dL (ref 0–149)
VLDL Cholesterol Cal: 16 mg/dL (ref 5–40)

## 2024-02-11 ENCOUNTER — Other Ambulatory Visit: Payer: Self-pay

## 2024-02-11 ENCOUNTER — Other Ambulatory Visit (HOSPITAL_COMMUNITY): Payer: Self-pay

## 2024-02-13 ENCOUNTER — Other Ambulatory Visit: Payer: Self-pay | Admitting: Family Medicine

## 2024-02-13 DIAGNOSIS — R42 Dizziness and giddiness: Secondary | ICD-10-CM

## 2024-02-14 ENCOUNTER — Other Ambulatory Visit (HOSPITAL_BASED_OUTPATIENT_CLINIC_OR_DEPARTMENT_OTHER): Payer: Self-pay

## 2024-02-14 ENCOUNTER — Other Ambulatory Visit (HOSPITAL_COMMUNITY): Payer: Self-pay

## 2024-02-15 ENCOUNTER — Other Ambulatory Visit (HOSPITAL_COMMUNITY): Payer: Self-pay

## 2024-02-15 MED ORDER — ONDANSETRON 4 MG PO TBDP
4.0000 mg | ORAL_TABLET | Freq: Three times a day (TID) | ORAL | 0 refills | Status: DC | PRN
  Filled 2024-02-15: qty 9, 3d supply, fill #0

## 2024-02-24 ENCOUNTER — Encounter: Payer: Self-pay | Admitting: Family Medicine

## 2024-02-24 DIAGNOSIS — Z13820 Encounter for screening for osteoporosis: Secondary | ICD-10-CM

## 2024-02-25 ENCOUNTER — Other Ambulatory Visit (HOSPITAL_COMMUNITY): Payer: Self-pay

## 2024-02-25 DIAGNOSIS — F410 Panic disorder [episodic paroxysmal anxiety] without agoraphobia: Secondary | ICD-10-CM | POA: Diagnosis not present

## 2024-02-25 DIAGNOSIS — F900 Attention-deficit hyperactivity disorder, predominantly inattentive type: Secondary | ICD-10-CM | POA: Diagnosis not present

## 2024-02-25 DIAGNOSIS — F3181 Bipolar II disorder: Secondary | ICD-10-CM | POA: Diagnosis not present

## 2024-02-25 MED ORDER — LURASIDONE HCL 60 MG PO TABS
60.0000 mg | ORAL_TABLET | ORAL | 1 refills | Status: AC
  Filled 2024-02-25: qty 90, 90d supply, fill #0
  Filled 2024-05-24: qty 90, 90d supply, fill #1

## 2024-02-25 MED ORDER — BUSPIRONE HCL 10 MG PO TABS
10.0000 mg | ORAL_TABLET | ORAL | 1 refills | Status: AC
  Filled 2024-02-25: qty 60, 30d supply, fill #0
  Filled 2024-05-24: qty 60, 30d supply, fill #1

## 2024-02-25 MED ORDER — BUPROPION HCL ER (XL) 300 MG PO TB24
300.0000 mg | ORAL_TABLET | Freq: Every morning | ORAL | 0 refills | Status: DC
  Filled 2024-02-25: qty 90, 90d supply, fill #0

## 2024-02-28 ENCOUNTER — Other Ambulatory Visit (HOSPITAL_COMMUNITY): Payer: Self-pay

## 2024-02-28 MED ORDER — COMIRNATY 30 MCG/0.3ML IM SUSY
0.3000 mL | PREFILLED_SYRINGE | Freq: Once | INTRAMUSCULAR | 0 refills | Status: AC
Start: 2024-02-28 — End: 2024-02-29
  Filled 2024-02-28: qty 0.3, 1d supply, fill #0

## 2024-03-01 ENCOUNTER — Ambulatory Visit

## 2024-03-09 ENCOUNTER — Other Ambulatory Visit (HOSPITAL_COMMUNITY): Payer: Self-pay

## 2024-03-09 ENCOUNTER — Other Ambulatory Visit: Payer: Self-pay | Admitting: Family Medicine

## 2024-03-09 DIAGNOSIS — E785 Hyperlipidemia, unspecified: Secondary | ICD-10-CM

## 2024-03-09 MED ORDER — ATORVASTATIN CALCIUM 80 MG PO TABS
80.0000 mg | ORAL_TABLET | Freq: Every day | ORAL | 3 refills | Status: AC
  Filled 2024-03-09: qty 90, 90d supply, fill #0
  Filled 2024-06-06 – 2024-06-19 (×2): qty 90, 90d supply, fill #1

## 2024-04-04 DIAGNOSIS — E119 Type 2 diabetes mellitus without complications: Secondary | ICD-10-CM | POA: Diagnosis not present

## 2024-04-09 ENCOUNTER — Other Ambulatory Visit: Payer: Self-pay | Admitting: Family Medicine

## 2024-04-09 DIAGNOSIS — R42 Dizziness and giddiness: Secondary | ICD-10-CM

## 2024-04-10 ENCOUNTER — Other Ambulatory Visit: Payer: Self-pay

## 2024-04-10 ENCOUNTER — Other Ambulatory Visit (HOSPITAL_COMMUNITY): Payer: Self-pay

## 2024-04-10 MED ORDER — ONDANSETRON 4 MG PO TBDP
4.0000 mg | ORAL_TABLET | Freq: Three times a day (TID) | ORAL | 0 refills | Status: DC | PRN
  Filled 2024-04-10: qty 9, 3d supply, fill #0

## 2024-04-11 ENCOUNTER — Other Ambulatory Visit (HOSPITAL_COMMUNITY): Payer: Self-pay

## 2024-04-12 ENCOUNTER — Other Ambulatory Visit (HOSPITAL_COMMUNITY): Payer: Self-pay

## 2024-04-15 ENCOUNTER — Ambulatory Visit (HOSPITAL_BASED_OUTPATIENT_CLINIC_OR_DEPARTMENT_OTHER)
Admission: RE | Admit: 2024-04-15 | Discharge: 2024-04-15 | Disposition: A | Discharge status: Home / Self Care | Source: Ambulatory Visit | Attending: Family Medicine | Admitting: Family Medicine

## 2024-04-15 DIAGNOSIS — Z13820 Encounter for screening for osteoporosis: Secondary | ICD-10-CM | POA: Insufficient documentation

## 2024-04-15 DIAGNOSIS — Z780 Asymptomatic menopausal state: Secondary | ICD-10-CM | POA: Diagnosis not present

## 2024-04-17 ENCOUNTER — Ambulatory Visit: Payer: Self-pay | Admitting: Family Medicine

## 2024-05-09 ENCOUNTER — Other Ambulatory Visit: Payer: Self-pay | Admitting: Family Medicine

## 2024-05-09 DIAGNOSIS — R42 Dizziness and giddiness: Secondary | ICD-10-CM

## 2024-05-10 ENCOUNTER — Other Ambulatory Visit: Payer: Self-pay

## 2024-05-10 ENCOUNTER — Other Ambulatory Visit (HOSPITAL_COMMUNITY): Payer: Self-pay

## 2024-05-10 MED ORDER — ONDANSETRON 4 MG PO TBDP
4.0000 mg | ORAL_TABLET | Freq: Three times a day (TID) | ORAL | 0 refills | Status: DC | PRN
  Filled 2024-05-10 – 2024-05-16 (×2): qty 9, 3d supply, fill #0

## 2024-05-16 ENCOUNTER — Other Ambulatory Visit (HOSPITAL_COMMUNITY): Payer: Self-pay

## 2024-05-20 ENCOUNTER — Other Ambulatory Visit (HOSPITAL_COMMUNITY): Payer: Self-pay

## 2024-05-24 ENCOUNTER — Other Ambulatory Visit (HOSPITAL_COMMUNITY): Payer: Self-pay

## 2024-05-24 ENCOUNTER — Other Ambulatory Visit: Payer: Self-pay | Admitting: Family Medicine

## 2024-05-24 DIAGNOSIS — R42 Dizziness and giddiness: Secondary | ICD-10-CM

## 2024-05-25 ENCOUNTER — Other Ambulatory Visit (HOSPITAL_COMMUNITY): Payer: Self-pay

## 2024-05-25 MED ORDER — ONDANSETRON 4 MG PO TBDP
4.0000 mg | ORAL_TABLET | Freq: Three times a day (TID) | ORAL | 0 refills | Status: DC | PRN
  Filled 2024-05-25: qty 9, 3d supply, fill #0

## 2024-05-27 ENCOUNTER — Other Ambulatory Visit: Payer: Self-pay

## 2024-05-27 ENCOUNTER — Other Ambulatory Visit (HOSPITAL_COMMUNITY): Payer: Self-pay

## 2024-05-29 ENCOUNTER — Other Ambulatory Visit: Payer: Self-pay

## 2024-05-30 ENCOUNTER — Other Ambulatory Visit: Payer: Self-pay

## 2024-05-30 ENCOUNTER — Other Ambulatory Visit (HOSPITAL_COMMUNITY): Payer: Self-pay

## 2024-05-30 MED ORDER — BUPROPION HCL ER (XL) 300 MG PO TB24
ORAL_TABLET | ORAL | 0 refills | Status: AC
  Filled 2024-05-30 – 2024-06-15 (×2): qty 90, 90d supply, fill #0

## 2024-05-30 MED ORDER — LURASIDONE HCL 40 MG PO TABS
40.0000 mg | ORAL_TABLET | Freq: Every day | ORAL | 0 refills | Status: DC
  Filled 2024-05-30: qty 90, 90d supply, fill #0

## 2024-06-02 ENCOUNTER — Other Ambulatory Visit (HOSPITAL_COMMUNITY): Payer: Self-pay

## 2024-06-02 ENCOUNTER — Encounter: Payer: Self-pay | Admitting: Internal Medicine

## 2024-06-02 ENCOUNTER — Ambulatory Visit: Admitting: Internal Medicine

## 2024-06-02 VITALS — BP 120/80 | HR 95 | Ht 67.0 in | Wt 246.6 lb

## 2024-06-02 DIAGNOSIS — E66812 Obesity, class 2: Secondary | ICD-10-CM

## 2024-06-02 DIAGNOSIS — Z7984 Long term (current) use of oral hypoglycemic drugs: Secondary | ICD-10-CM | POA: Diagnosis not present

## 2024-06-02 DIAGNOSIS — Z6838 Body mass index (BMI) 38.0-38.9, adult: Secondary | ICD-10-CM | POA: Diagnosis not present

## 2024-06-02 DIAGNOSIS — E11311 Type 2 diabetes mellitus with unspecified diabetic retinopathy with macular edema: Secondary | ICD-10-CM

## 2024-06-02 DIAGNOSIS — Z7985 Long-term (current) use of injectable non-insulin antidiabetic drugs: Secondary | ICD-10-CM | POA: Diagnosis not present

## 2024-06-02 DIAGNOSIS — E785 Hyperlipidemia, unspecified: Secondary | ICD-10-CM

## 2024-06-02 LAB — POCT GLYCOSYLATED HEMOGLOBIN (HGB A1C): Hemoglobin A1C: 6 % — AB (ref 4.0–5.6)

## 2024-06-02 NOTE — Progress Notes (Signed)
 Patient ID: Sarah Walls, female   DOB: 1960/06/28, 63 y.o.   MRN: [id number]   HPI: Sarah Walls is a 63 y.o.-year-old female, presenting for f/u for DM2, dx in ~2010, prev. 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 Last visit 5 months ago.  Interim history: No increased urination, blurry vision, chest pain. She has a history of nausea - has Meniere's ds. >> improved.  She does report blurry vision when working long hours on the computer.  She usually works 12-hour shifts. She is frustrated about her weight and is trying to lose weight especially as she also started to develop left hip pain  Reviewed HbA1c levels: Lab Results  Component Value Date   HGBA1C 6.5 (A) 12/24/2023   HGBA1C 6.4 (A) 06/18/2023   HGBA1C 6.2 12/11/2022   HGBA1C 6.1 (A) 05/29/2022   HGBA1C 6.2 (A) 01/19/2022   HGBA1C 7.8 (A) 09/15/2021   HGBA1C 8.2 (A) 04/02/2021   HGBA1C 7.9 (A) 12/17/2020   HGBA1C 7.8 (A) 08/16/2020   HGBA1C 8.9 (A) 02/13/2020   She is on: - Metformin  2000 mg with dinner >> ER ... - but forgetting it at night >> 2000 mg with breakfast - Jardiance  25 mg before breakfast - Trulicity  1.5 >> 3 mg weekly in a.m.- restarted 01/2020 Unfortunately, 3 weeks before last visit, she came off Jardiance  as she could not refill it We stopped Januvia  when we started Trulicity . She was previously on glipizide  twice a day. She was previously on Levemir  40 units twice a day.  Then Toujeo  60 units in a.m. - gradually decreased the dose and finally came off 09/2022.  Pt checks her sugars >4x a day -changed from Dexcom to [facility], not able to obtain it from [facility]):  Previously:  Previously:  Lowest sugar was 60s >> 60 >> 72 >> <55; she has hypoglycemia awareness in the 80s Highest sugar was 276 >> ... 250 x1-2x >> 250 (sick) >> 200s.  Glucometer: AccuChek  Pt's meals are: - Breakfast: b'fast sandwich + yoghurt; boiled eggs +  slice of bacon + yoghurt - Lunch: frozen meal +/- salad - Dinner: frozen meals - Snacks: zucchini bread, apple.  She eats a snack before going to bed.  -No CKD, last BUN/creatinine:  Lab Results  Component Value Date   BUN 10 02/08/2024   BUN 7 (L) 10/12/2023   CREATININE 0.75 02/08/2024   CREATININE 0.61 10/12/2023   Lab Results  Component Value Date   MICRALBCREAT 6 06/18/2023   MICRALBCREAT 27.5 01/10/2010   MICRALBCREAT 203.4 (H) 06/03/2009   MICRALBCREAT 13.0 02/27/2008  On lisinopril .  -+ HL; last set of lipids: Lab Results  Component Value Date   CHOL 131 02/08/2024   HDL 66 02/08/2024   LDLCALC 49 02/08/2024   LDLDIRECT 140.1 10/06/2011   TRIG 84 02/08/2024   CHOLHDL 2.0 02/08/2024  On Lipitor 40.  - last eye exam was in 05/2023: stable; + mild DR; she has a history of cataract surgery in 2016.  - no numbness and tingling in her feet.  Last foot exam 12/24/2023.  She was on Lamisil  for toe fungus >> resolved.  She had a 2dECHO 10/2020 >> EF 50%  - checked 2/2 chronic tachycardia. 03/08/2022: Coronary calcium  score 0.  ROS: + See HPI  I reviewed pt's medications, allergies, PMH, social hx, family hx, and changes were documented in the history of present illness. Otherwise, unchanged from my initial visit note.  Past  Medical History:  Diagnosis Date   AC joint pain 08/13/2011   Allergy    Arthritis    Back pain 11/20/2010   Bipolar disorder (HCC)    Cataract    Depression    BAD2 - Dr. Lucyann   Diabetes mellitus without complication (HCC)    Hyperlipidemia    Hypertension    Panic attacks    BAD2 - Dr. 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    Social History Main Topics   Smoking status: Never Smoker   Smokeless tobacco: Never Used   Alcohol use No   Drug use: No   Social History Narrative   05/2009 Masters in counseling in disabled.   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       Mother lived in home.  (Deceased quickly Spring 09 from metastatic melanoma)   Current Outpatient Medications on File Prior to Visit  Medication Sig Dispense Refill   ACCU-CHEK FASTCLIX LANCETS MISC Check blood 1 - 2 times daily and as directed. Dx E11.65 200 each 1   ACCU-CHEK GUIDE test strip CHECK BLOOD 1 - 2 TIMES DAILY AND AS DIRECTED. DX E11.65 200 strip 1   ALPRAZolam  (XANAX ) 1 MG tablet Take 1 tablet (1 mg total) by mouth daily as needed for severe anxiety/ panic 30 tablet 1   atorvastatin  (LIPITOR) 80 MG tablet Take 1 tablet (80 mg total) by mouth daily at 6 PM. 90 tablet 3   buPROPion  (WELLBUTRIN  XL) 300 MG 24 hr tablet Take 1 tablet (300 mg total) by mouth in the morning as soon as you wake up 90 tablet 0   busPIRone  (BUSPAR ) 10 MG tablet Take 1 tablet (10 mg total) by mouth once or twice daily 60 tablet 1   Dulaglutide  (TRULICITY ) 3 MG/0.5ML SOAJ Inject 3 mg into the skin once a week. 6 mL 3   empagliflozin  (JARDIANCE ) 25 MG TABS tablet Take 1 tablet (25 mg total) by mouth daily. 90 tablet 3   Insulin  Pen Needle (UNIFINE PENTIPS) 31G X 5 MM MISC USE DAILY WITH INSULIN  PEN 100 each 3   lisinopril  (ZESTRIL ) 10 MG tablet Take 1 tablet (10 mg total) by mouth at bedtime. 90 tablet 3   lurasidone  (LATUDA ) 40 MG TABS tablet Take 1 tablet (40 mg total) by mouth daily. 90 tablet 0   Lurasidone  HCl 60 MG TABS Take 1 tablet (60 mg total) by mouth daily, must  administer with food ( at least 350 calories) 90 tablet 1   metFORMIN  (GLUCOPHAGE -XR) 500 MG 24 hr tablet Take 4 tablets by mouth daily with supper. 360 tablet 3   metoprolol  tartrate (LOPRESSOR ) 25 MG tablet Take 1 tablet by mouth 2 times daily. 180 tablet 3   Multiple Vitamin (MULTIVITAMIN) tablet Take  1 tablet by mouth daily.     ondansetron  (ZOFRAN -ODT) 4 MG disintegrating tablet Dissolve 1 tablet by mouth every 8 hours as needed for nausea or vomiting. 9 tablet 0   terbinafine  (LAMISIL ) 250 MG tablet Take 1 tablet (250 mg total) by mouth daily. 84 tablet 0   No current facility-administered medications on file prior to visit.   Allergies  Allergen Reactions   Beta Adrenergic Blockers Other (See Comments)    Drowsy, fatigue   Family History  Problem Relation Age of Onset   Cancer Mother        Metastatic liver CA   Thyroid disease Mother        Hypothyroidism   Vision loss Mother        Eye tumor, removed orbit at the end of 2004   Melanoma Mother    Diabetes Mother    Diabetes Father    Heart disease Father        CABG, coronary disease   Thyroid disease Sister        Hypothyroidism   Colon cancer Maternal Aunt 76   Breast cancer Neg Hx    Colon polyps Neg Hx    Esophageal cancer Neg Hx    Rectal cancer Neg Hx    Stomach cancer Neg Hx    PE: BP 120/80   Pulse 95   Ht 5' 7 (1.702 m)   Wt 246 lb 9.6 oz (111.9 kg)   LMP 08/28/2015   SpO2 94%   BMI 38.62 kg/m  Wt Readings from Last 15 Encounters:  06/02/24 246 lb 9.6 oz (111.9 kg)  02/08/24 242 lb 12.8 oz (110.1 kg)  12/24/23 242 lb 12.8 oz (110.1 kg)  08/27/23 245 lb 3.2 oz (111.2 kg)  06/18/23 242 lb 9.6 oz (110 kg)  12/29/22 236 lb (107 kg)  12/11/22 235 lb 9.6 oz (106.9 kg)  11/13/22 237 lb 6.4 oz (107.7 kg)  09/02/22 239 lb (108.4 kg)  05/29/22 238 lb 6.4 oz (108.1 kg)  04/20/22 229 lb 6.4 oz (104.1 kg)  04/02/22 231 lb 3.2 oz (104.9 kg)  02/13/22 231 lb 3.2 oz (104.9 kg)  02/04/22 234 lb (106.1 kg)   01/19/22 234 lb (106.1 kg)   Constitutional: overweight, in NAD Eyes: EOMI, no exophthalmos ENT: no thyromegaly, no cervical lymphadenopathy Cardiovascular: tachycardi,a RR, No MRG Respiratory: CTA B Musculoskeletal: no deformities Skin: no rashes Neurological: no tremor with outstretched hands  ASSESSMENT: 1. DM2, non insulin -dependent, now controlled, with complications - mild DR  2. Obesity class 2  3. HL  PLAN:  1. Patient with longstanding, fairly well-controlled type 2 diabetes, on metformin , SGLT2 inhibitor and weekly GLP-1 receptor agonist, with a worse HbA1c at last visit, 6.5%.  At that time, sugars were fairly well-controlled per review of her CGM trends, with only occasional hyperglycemic spikes especially after breakfast but with the majority of the blood sugars fluctuating within the target range.  We did not change her regimen at that time.  I refilled her Jardiance  and Trulicity  then. CGM interpretation: -At today's visit, we reviewed her CGM downloads: It appears that 98% of values are in target range (goal >70%), while 2% are higher than 180 (goal <25%), and 0% are lower than 70 (goal <4%).  The calculated average blood sugar is 117.  The projected HbA1c for the next 3 months (GMI) is 6.1%. -Reviewing the CGM trends, sugars are almost entirely at gal, with only few mild hyperglycemic exceptions after dinner. - I suggested  to:  Patient Instructions  Please continue: - Metformin  2000 mg in am  - Jardiance  25 mg before breakfast - Trulicity  3 mg weekly  Stop eating at night! If the sugars drop at night, please let me know.   Please return in 4-6 months.   - we checked her HbA1c: 6.0% (lower) - advised to check sugars at different times of the day - 4x a day, rotating check times - advised for yearly eye exams >> she is UTD - return to clinic in 4-6 months  2. Obesity class 2 - She had great success on a plant-based diet in the past but she could not continue.   We discussed about the whole 30 diet and the weight watchers diet in the past - will continue the SGLT2 inhibitor and GLP-1 receptor agonist, which should also help with weight loss - Weight was approximately stable at last visit and she gained 4 pounds since then - At today's visit we discussed about stopping the snack at night.  She mentions that she is eating a snack to prevent lows in the middle of the night.  She mentions having had some low blood sugars under 55.  I am wondering if these are not compression lows.  We did discuss about checking the blood sugars with her glucometer to confirm but I strongly advised her to stop the snack at night especially as she is trying to lose weight.  3. HL - Regulated lipid panel from 01/2024: All fractions at goal-LDL significantly improved from baseline: Lab Results  Component Value Date   CHOL 131 02/08/2024   HDL 66 02/08/2024   LDLCALC 49 02/08/2024   LDLDIRECT 140.1 10/06/2011   TRIG 84 02/08/2024   CHOLHDL 2.0 02/08/2024  - She continues on Lipitor 80 mg daily without side effects  Lela Fendt, MD PhD [facility]

## 2024-06-02 NOTE — Patient Instructions (Addendum)
 Please continue: - Metformin  2000 mg in am  - Jardiance  25 mg before breakfast - Trulicity  3 mg weekly  Stop eating at night! If the sugars drop at night, please let me know.   Please return in 4-6 months.

## 2024-06-12 ENCOUNTER — Other Ambulatory Visit (HOSPITAL_COMMUNITY): Payer: Self-pay

## 2024-06-15 ENCOUNTER — Other Ambulatory Visit: Payer: Self-pay | Admitting: Internal Medicine

## 2024-06-15 ENCOUNTER — Other Ambulatory Visit: Payer: Self-pay

## 2024-06-15 ENCOUNTER — Other Ambulatory Visit (HOSPITAL_COMMUNITY): Payer: Self-pay

## 2024-06-15 ENCOUNTER — Other Ambulatory Visit: Payer: Self-pay | Admitting: Family Medicine

## 2024-06-15 DIAGNOSIS — R42 Dizziness and giddiness: Secondary | ICD-10-CM

## 2024-06-16 ENCOUNTER — Other Ambulatory Visit (HOSPITAL_COMMUNITY): Payer: Self-pay

## 2024-06-16 MED ORDER — METFORMIN HCL ER 500 MG PO TB24
2000.0000 mg | ORAL_TABLET | Freq: Every day | ORAL | 3 refills | Status: AC
  Filled 2024-06-16 – 2024-06-20 (×3): qty 360, 90d supply, fill #0

## 2024-06-17 ENCOUNTER — Other Ambulatory Visit (HOSPITAL_COMMUNITY): Payer: Self-pay

## 2024-06-18 ENCOUNTER — Other Ambulatory Visit (HOSPITAL_COMMUNITY): Payer: Self-pay

## 2024-06-19 ENCOUNTER — Other Ambulatory Visit (HOSPITAL_COMMUNITY): Payer: Self-pay

## 2024-06-20 ENCOUNTER — Other Ambulatory Visit (HOSPITAL_COMMUNITY): Payer: Self-pay

## 2024-06-20 ENCOUNTER — Other Ambulatory Visit: Payer: Self-pay

## 2024-06-20 MED ORDER — ONDANSETRON 4 MG PO TBDP
4.0000 mg | ORAL_TABLET | Freq: Three times a day (TID) | ORAL | 0 refills | Status: AC | PRN
  Filled 2024-06-20: qty 9, 3d supply, fill #0

## 2024-06-23 LAB — OPHTHALMOLOGY REPORT-SCANNED

## 2024-06-30 ENCOUNTER — Ambulatory Visit: Payer: Self-pay | Admitting: Family Medicine

## 2024-06-30 ENCOUNTER — Other Ambulatory Visit (HOSPITAL_COMMUNITY): Payer: Self-pay

## 2024-06-30 ENCOUNTER — Other Ambulatory Visit: Payer: Self-pay

## 2024-06-30 ENCOUNTER — Encounter: Payer: Self-pay | Admitting: Family Medicine

## 2024-06-30 VITALS — BP 133/86 | HR 91 | Ht 67.0 in | Wt 244.1 lb

## 2024-06-30 DIAGNOSIS — M25552 Pain in left hip: Secondary | ICD-10-CM | POA: Diagnosis not present

## 2024-06-30 DIAGNOSIS — L720 Epidermal cyst: Secondary | ICD-10-CM | POA: Diagnosis not present

## 2024-06-30 DIAGNOSIS — Z794 Long term (current) use of insulin: Secondary | ICD-10-CM | POA: Diagnosis not present

## 2024-06-30 DIAGNOSIS — E113291 Type 2 diabetes mellitus with mild nonproliferative diabetic retinopathy without macular edema, right eye: Secondary | ICD-10-CM

## 2024-06-30 MED ORDER — LURASIDONE HCL 20 MG PO TABS
ORAL_TABLET | ORAL | 0 refills | Status: AC
  Filled 2024-06-30 (×2): qty 30, 30d supply, fill #0

## 2024-06-30 MED ORDER — LAMOTRIGINE 100 MG PO TABS
100.0000 mg | ORAL_TABLET | Freq: Every day | ORAL | 0 refills | Status: AC
  Filled 2024-06-30 (×2): qty 30, 30d supply, fill #0

## 2024-06-30 MED ORDER — LORAZEPAM 1 MG PO TABS
1.0000 mg | ORAL_TABLET | Freq: Every day | ORAL | 1 refills | Status: AC | PRN
  Filled 2024-06-30 (×2): qty 20, 20d supply, fill #0

## 2024-06-30 MED ORDER — DOXYCYCLINE HYCLATE 100 MG PO TABS
100.0000 mg | ORAL_TABLET | Freq: Two times a day (BID) | ORAL | 0 refills | Status: AC
  Filled 2024-06-30 (×2): qty 14, 7d supply, fill #0

## 2024-06-30 MED ORDER — LAMOTRIGINE 25 MG PO TABS
ORAL_TABLET | ORAL | 0 refills | Status: AC
  Filled 2024-06-30 (×2): qty 45, 28d supply, fill #0

## 2024-06-30 NOTE — Patient Instructions (Signed)
 It was wonderful to see you today!  For your left-sided hip pain, I have ordered some x-rays.  Please go to [address] to [place] [facility] to have x-rays done of your hip and lumbar spine.  Will also place a referral for you to go see you orthopedics to be evaluated further.  We also did a cyst excision of one of the large cysts on your back.  While I was able to get most of the capsule I do not think I was able to get at all.  It may recur in which case I am happy to try and drain it again.  The second 1 on your back does appear to be mildly infected so we will do a 7-day course of doxycycline  and I will have you come back in 10 days to remove the stitches from the first excision and perform the second excision.  In the meantime keep the area clean and dry.  Do not use alcohol wipes or rinses on the area, but you may gently cleanse with soap and water.  If you notice any swelling or redness or it starts to leak of fluid that looks like pus, please call us  right away and come in to be seen.  Please call [phone] with any questions about today's appointment.   If you need any additional refills, please call your pharmacy before calling the office.  Lucie Pinal, DO Family Medicine

## 2024-06-30 NOTE — Progress Notes (Addendum)
" ° ° °  SUBJECTIVE:   CHIEF COMPLAINT / HPI:   Hip Pain - has been going on for about 6 months - was trying to stand from a sitting position, but had to twist awkwardly to stand up - felt significant pain with internal rotation of the left hip - now about half the time when she stands up, it feels like her hip is out of place, or not quite lined up - no radiation, no loss of sensation - subsides after a few steps - only occurs when going from sitting to standing/weight bearing.  Wound on the back - two lumps near the mid back - top lump has been present for years and drained spontaneously at one time - the second showed up a few weeks ago - the newer lump is near her bra band and has become irritated and inflamed.  - she covered it with a bandage, which seems to have helped somewhat - no current drainage from either lump  PERTINENT  PMH / PSH: t2DM, HTN, Meniere's Disease  OBJECTIVE:   BP 133/86   Pulse 91   Ht 5' 7 (1.702 m)   Wt 244 lb 2 oz (110.7 kg)   LMP 08/28/2015   SpO2 95%   BMI 38.24 kg/m   General: A&O, NAD Cardiac: RRR, no m/r/g Respiratory: CTAB, normal WOB, no w/c/r Extremities: NTTP, no peripheral edema. Full ROM in the BL hips in all planes of motion. FABER and FADIR are negative bilaterally. Patient demonstrates antalgic gait on the left while walking.  Skin:   Diagnosis: epidermal inclusion cyst - Location: Midline, upper trunk Procedure: Incision & drainage Informed consent:  Discussed risks (permanent loss of nail, permanent irregular growth of nail, infection, pain, bleeding, bruising, numbness, and recurrence of the condition) and benefits of the procedure, as well as the alternatives.  Informed consent was obtained. Anesthesia: Lidocaine w/epi Type: total The area was prepared and draped in a standard fashion. The lesion drained white, chalky, cyst material. The lesion was multiloculated. Multiple cavities were opened and drained. Pieces of cyst  wall were extracted. Incision was closed with one suture using 4-0 vicryl, non-resorbable suture.  Sterile dressing was applied over the wound The patient tolerated the procedure well. The patient was instructed on post-op care.    ASSESSMENT/PLAN:   Assessment & Plan Left hip pain - suspect this patient could have a labral tear or other such cartilaginous injury - will obtain DG hip as well as lumbar spine to assess for bony pathology - amb referral to orthopedic surgery for further evaluation Epidermoid cyst - successful incision and drainage with partial cyst wall removal completed in office today for more superior cyst - given irritation of inferior cyst, will treat with 7 days of doxycycline .  - patient will return in 10 days for suture removal. At that time, I will assess the inferior lesion and perform another incision and drainage. - patient was counseled on wound care and understands the current plan.  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

## 2024-06-30 NOTE — Assessment & Plan Note (Signed)
-   routine kidney monitoring ordered and completed with Urine ACR

## 2024-07-01 ENCOUNTER — Other Ambulatory Visit (HOSPITAL_COMMUNITY): Payer: Self-pay

## 2024-07-01 LAB — MICROALBUMIN / CREATININE URINE RATIO
Creatinine, Urine: 78.9 mg/dL
Microalb/Creat Ratio: 9 mg/g{creat} (ref 0–29)
Microalbumin, Urine: 7.4 ug/mL

## 2024-07-02 ENCOUNTER — Other Ambulatory Visit: Payer: Self-pay

## 2024-07-03 ENCOUNTER — Other Ambulatory Visit: Payer: Self-pay

## 2024-07-03 ENCOUNTER — Ambulatory Visit: Payer: Self-pay | Admitting: Family Medicine

## 2024-07-03 ENCOUNTER — Other Ambulatory Visit (HOSPITAL_COMMUNITY): Payer: Self-pay

## 2024-07-05 ENCOUNTER — Ambulatory Visit
Admission: RE | Admit: 2024-07-05 | Discharge: 2024-07-05 | Disposition: A | Source: Ambulatory Visit | Attending: Family Medicine | Admitting: Family Medicine

## 2024-07-05 DIAGNOSIS — M25552 Pain in left hip: Secondary | ICD-10-CM

## 2024-07-07 ENCOUNTER — Ambulatory Visit: Admitting: Family Medicine

## 2024-07-07 ENCOUNTER — Other Ambulatory Visit (HOSPITAL_COMMUNITY): Payer: Self-pay

## 2024-07-07 VITALS — BP 125/80 | HR 99 | Temp 98.0°F | Ht 67.0 in | Wt 243.4 lb

## 2024-07-07 DIAGNOSIS — L089 Local infection of the skin and subcutaneous tissue, unspecified: Secondary | ICD-10-CM | POA: Diagnosis not present

## 2024-07-07 DIAGNOSIS — L729 Follicular cyst of the skin and subcutaneous tissue, unspecified: Secondary | ICD-10-CM

## 2024-07-07 MED ORDER — CEPHALEXIN 500 MG PO CAPS
500.0000 mg | ORAL_CAPSULE | Freq: Two times a day (BID) | ORAL | 0 refills | Status: DC
  Filled 2024-07-07: qty 14, 7d supply, fill #0

## 2024-07-07 NOTE — Patient Instructions (Addendum)
 It was great to see you! Thank you for allowing me to participate in your care!  Our plans for today:   VISIT SUMMARY: Today we addressed the bleeding and drainage from the incision on your back where a cyst was removed. We also discussed the infection in another cyst on your back.  YOUR PLAN: INFECTED EPIDERMAL CYST OF BACK, POST-PROCEDURE: You have an infection at the site where the cyst was removed, which is causing increased drainage and bleeding. -We have prescribed a new antibiotic to replace doxycycline , as it was not effective. -This is called keflex , you will take this twice daily for 7 days -Clean the wound with soap and water, and avoid using alcohol. -Apply triple antibiotic ointment to the wound. -Please make sure to go to your follow-up appointment    Contains text generated by [employer].    Please arrive 15 minutes PRIOR to your next scheduled appointment time! If you do not, this affects OTHER patients' care.  Take care and seek immediate care sooner if you develop any concerns.   Ozell Provencal, MD, PGY-3 [facility] 11:39 AM 07/07/2024  [facility]

## 2024-07-07 NOTE — Progress Notes (Signed)
" ° ° °  SUBJECTIVE:   CHIEF COMPLAINT / HPI: cyst concern  Discussed the use of AI scribe software for clinical note transcription with the patient, who gave verbal consent to proceed.  History of Present Illness Sarah Walls is a 64 year old female who presents with bleeding and drainage from a cyst incision on her back.  Post-procedure wound bleeding and drainage - Cyst on back was incised, drained, and closed with a stitch approximately one week ago - New significant bleeding and increased drainage from the incision site this morning - Bandage was soaked with blood and fluid; additional fluid was expressed before rebandaging - No fever or other systemic symptoms  Antibiotic therapy - Completed nearly full course of doxycycline  since the procedure; one pill remaining - No known antibiotic allergies  Secondary suspected infected cyst - Another cyst on back appears infected but has not been treated    PERTINENT  PMH / PSH: HTN, T2DM  OBJECTIVE:   BP 125/80   Pulse 99   Temp 98 F (36.7 C) (Oral)   Ht 5' 7 (1.702 m)   Wt 243 lb 6 oz (110.4 kg)   LMP 08/28/2015   SpO2 93%   BMI 38.12 kg/m   Physical Exam General: NAD, well appearing Neuro: A&O Respiratory: normal WOB on RA Extremities: Moving all 4 extremities equally SKIN: Incision on upper back with drainage and bleeding. Untreated cyst just below actively draining one   ASSESSMENT/PLAN:   Assessment & Plan Infected cyst of skin Clinically consistent with post incision infection.  Reassuringly stable vital signs, afebrile.  Given this has occurred while on doxycycline , will switch antibiotics for better streptococcal species coverage. - 500 mg Keflex  twice daily for 7 days - Patient has follow-up appointment on 01/28 - Singular stitch removed today (8 days post-op)   Return in about 1 week (around 07/14/2024).  Ozell Provencal, MD, PGY-3 [facility] 12:00 PM 07/07/2024  [facility]   "

## 2024-07-12 ENCOUNTER — Other Ambulatory Visit (HOSPITAL_COMMUNITY): Payer: Self-pay

## 2024-07-12 ENCOUNTER — Encounter: Payer: Self-pay | Admitting: Family Medicine

## 2024-07-12 ENCOUNTER — Ambulatory Visit: Payer: Self-pay | Admitting: Family Medicine

## 2024-07-12 VITALS — BP 127/86 | HR 82 | Ht 67.0 in | Wt 244.1 lb

## 2024-07-12 DIAGNOSIS — Z0000 Encounter for general adult medical examination without abnormal findings: Secondary | ICD-10-CM | POA: Diagnosis not present

## 2024-07-12 DIAGNOSIS — Z7689 Persons encountering health services in other specified circumstances: Secondary | ICD-10-CM

## 2024-07-12 MED ORDER — CEPHALEXIN 500 MG PO CAPS
500.0000 mg | ORAL_CAPSULE | Freq: Two times a day (BID) | ORAL | 0 refills | Status: AC
  Filled 2024-07-12 (×2): qty 10, 5d supply, fill #0

## 2024-07-12 NOTE — Progress Notes (Signed)
" ° ° °  SUBJECTIVE:   CHIEF COMPLAINT / HPI:   Patient returns today for excision of second large cyst on her back. First cyst has healed well and is no longer draining.   PERTINENT  PMH / PSH: T2DM  OBJECTIVE:   BP 127/86   Pulse 82   Ht 5' 7 (1.702 m)   Wt 244 lb 2 oz (110.7 kg)   LMP 08/28/2015   SpO2 95%   BMI 38.24 kg/m   Diagnosis: epidermal inclusion cyst - Location: thorax near midline on right Procedure: Incision & drainage Informed consent:  Discussed risks (permanent loss of nail, permanent irregular growth of nail, infection, pain, bleeding, bruising, numbness, and recurrence of the condition) and benefits of the procedure, as well as the alternatives.  Informed consent was obtained. Anesthesia: 1% lidocaine Type: partial The area was prepared and draped in a standard fashion. The lesion drained pus and blood. A large amount of fluid was drained. The lesion was multiloculated. Antibiotic ointment and a sterile pressure dressing were applied. The patient tolerated the procedure well. The patient was instructed on post-op care.   ASSESSMENT/PLAN:   Assessment & Plan Encounter for incision and drainage procedure - unable to remove cyst wall due to inflammation - one stitch with non-resorbable suture was placed to close the incision - cleaning instructions provided - 5 days of keflex  provided for infection prevention - patient will return in about 7 days to have the sutures removed   Lucie Pinal, DO [facility] "

## 2024-07-12 NOTE — Patient Instructions (Addendum)
 It was wonderful to see you today!  Today we drained your other cyst. Leave the dressing in place for 24 hours to allow the bandage to absorb some of the drainage. Afterward, you can clean dry the area daily. I have also sent in another course of keflex  to help with infection control, since this cyst was much more inflamed than the other. Please schedule an appointment in 7-10 days to remove the stitch.   Please call [phone] with any questions about today's appointment.   If you need any additional refills, please call your pharmacy before calling the office.  Lucie Pinal, DO Family Medicine

## 2024-07-19 ENCOUNTER — Ambulatory Visit: Admitting: Physician Assistant

## 2024-07-19 ENCOUNTER — Ambulatory Visit: Payer: Self-pay | Admitting: Family Medicine

## 2024-07-19 ENCOUNTER — Encounter: Payer: Self-pay | Admitting: Family Medicine

## 2024-07-19 ENCOUNTER — Other Ambulatory Visit (HOSPITAL_COMMUNITY): Payer: Self-pay

## 2024-07-19 VITALS — BP 120/72 | HR 86 | Ht 67.0 in | Wt 244.0 lb

## 2024-07-19 DIAGNOSIS — M25552 Pain in left hip: Secondary | ICD-10-CM | POA: Diagnosis not present

## 2024-07-19 DIAGNOSIS — L0291 Cutaneous abscess, unspecified: Secondary | ICD-10-CM

## 2024-07-19 MED ORDER — CEPHALEXIN 500 MG PO CAPS
500.0000 mg | ORAL_CAPSULE | Freq: Two times a day (BID) | ORAL | 0 refills | Status: AC
  Filled 2024-07-19: qty 20, 10d supply, fill #0

## 2024-07-19 NOTE — Progress Notes (Unsigned)
" ° ° °  SUBJECTIVE:   CHIEF COMPLAINT / HPI:   Follow up for stitch removal after drainage of abscess - wound has healed nicely, no further pain or irritation  Evaluated upper cyst that had previously been drained, incision has opened and is draining purulent fluid.   PERTINENT  PMH / PSH: T2DM, HTN  OBJECTIVE:   BP 120/72   Pulse 86   Ht 5' 7 (1.702 m)   Wt 244 lb (110.7 kg) Comment: Pt reported.  LMP 08/28/2015   SpO2 94%   BMI 38.22 kg/m   General: A&O, NAD Cardiac: RRR, no m/r/g Respiratory: CTAB, normal WOB, no w/c/r Skin: Well healed incision on inferior cyst. Upper cyst with 3mm drainage tract present near site of prior incision. Smaller, 1 mm hole present inferior on same plane. Large volume of purulent discharge expressed from lesion.   ASSESSMENT/PLAN:   Assessment & Plan Abscess - removed stitch from more inferior lesion, no further intervention necessary. - large amount of purulent fluid expressed from superior lesion. - drainage tract left open to allow any further fluid to drain - 10 days keflex  to cover for any persistent infection - follow up in one week.    Lucie Pinal, DO [facility] "

## 2024-07-19 NOTE — Progress Notes (Signed)
 "  Office Visit Note   Patient: Sarah Walls           Date of Birth: 1961/03/14           MRN: [id number] Visit Date: 07/19/2024              Requested by: McDiarmidKrystal BIRCH, MD [address] [place],  [place] [address] PCP: Cleotilde Lukes, DO   Assessment & Plan: Visit Diagnoses:  1. Pain in left hip     Plan: Impression is intermittent left hip pain which sounds mechanical in nature.  I am unable to elicit any pain with testing or hip joint.  I do not feel her symptoms are referred from her lumbar spine.  We have discussed a few different options to include diagnostic cortisone injection versus MR arthrogram versus physical therapy.  She would like to first try physical therapy to work on strengthening exercises.  If her symptoms do not prove over the next 6 to 8 weeks, I would like for her to follow-up with Dr. Jerri.  She will call me with any concerns or questions in the meantime.  I sent in a referral for outpatient PT.  Follow-Up Instructions: Return if symptoms worsen or fail to improve.   Orders:  Orders Placed This Encounter  Procedures   Ambulatory referral to Physical Therapy   No orders of the defined types were placed in this encounter.     Procedures: No procedures performed   Clinical Data: No additional findings.   Subjective: Chief Complaint  Patient presents with   Left Hip - Pain    HPI patient is a pleasant 64 year old female who comes in today with left hip pain.  Symptoms began approximately 9 months ago when going from a seated to standing position and twisting.  She has since had intermittent pain to the lateral groin since.  This occurs about 30% of the time when she goes from a seated to standing position.  She feels as though she has to often take a step before her hip goes back into place.  She notices this more when she has been working several days in a row and is on her feet.  She is not taking medication for this.  She does use a cane  however as she is unsure when this may occur.  She denies any actual locking or catching to the left hip.  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  She tells me she had 1 episode yesterday where she had some paresthesias around the knee.  No other weakness or paresthesias to the left lower extremity.  Previous x-rays of her left hip were obtained which showed minimal degenerative changes.  Review of Systems as detailed in HPI.  All others reviewed and are negative.   Objective: Vital Signs: LMP 08/28/2015   Physical Exam well-developed well-nourished female in no acute distress.  Alert and oriented x 3.  Ortho Exam left hip exam: No pain with logroll, FADIR Stinchfield testing.  No pain with lumbar flexion or extension.  She is neurovascularly intact distally.  Specialty Comments:  No specialty comments available.  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) 06/18/2023   Chronic midline low back pain without sciatica 11/13/2022   Meniere  disease 04/02/2022   PVC (premature ventricular contraction) 01/06/2022   Weight loss 12/05/2021   Encounter for screening for cervical cancer 09/16/2020   Annual physical exam 08/23/2020   Elevated LFTs 08/23/2020   Intertrigo 10/04/2019   Tachycardia 12/01/2017   Class 3 severe obesity with serious comorbidity in adult (HCC) 02/12/2017   HLD (hyperlipidemia) 05/16/2016   Vitamin D  deficiency 03/09/2015   Vertigo 11/01/2014   History of colonic polyps 10/13/2013   PPD positive, treated 08/25/2011   Bipolar disorder (HCC) 06/05/2009   Type 2 diabetes mellitus with mild nonproliferative retinopathy without macular edema, with long-term current use of insulin  (HCC) 03/15/2008   KNEE PAIN, BILATERAL 12/29/2006   Essential  hypertension 11/04/2006   IBS 11/04/2006   Past Medical History:  Diagnosis Date   AC joint pain 08/13/2011   Allergy    Arthritis    Back pain 11/20/2010   Bipolar disorder (HCC)    Cataract    Depression    BAD2 - Dr. Lucyann   Diabetes mellitus without complication (HCC)    Hyperlipidemia    Hypertension    Panic attacks    BAD2 - Dr. Lucyann   Personal history of colonic polyps - adenomas 10/13/2013   12/2013 - 6 adenomas max 12 mm - repeat colonoscopy  2018    Tachycardia    Tuberculosis    positive PPD    Family History  Problem Relation Age of Onset   Cancer Mother        Metastatic liver CA   Thyroid disease Mother        Hypothyroidism   Vision loss Mother        Eye tumor, removed orbit at the end of 2004   Melanoma Mother    Diabetes Mother    Diabetes Father    Heart disease Father        CABG, coronary disease   Thyroid disease Sister        Hypothyroidism   Colon cancer Maternal Aunt 24   Breast cancer Neg Hx    Colon polyps Neg Hx    Esophageal cancer Neg Hx    Rectal cancer Neg Hx    Stomach cancer Neg Hx     Past Surgical History:  Procedure Laterality Date   CATARACT EXTRACTION Bilateral 2015   CESAREAN SECTION  1990   Fetal Distress   COLONOSCOPY  06/25/2017   [facility]   EYE SURGERY     POLYPECTOMY     TONSILLECTOMY  1969   Social History   Occupational History   Occupation: Freight Forwarder: UNEMPLOYED  Tobacco Use   Smoking status: Never    Passive exposure: Never   Smokeless tobacco: Never  Vaping Use   Vaping status: Never Used  Substance and Sexual Activity   Alcohol use: No    Alcohol/week: 0.0 standard drinks of alcohol   Drug use: No   Sexual activity: Not Currently    Birth control/protection: None        "

## 2024-07-19 NOTE — Patient Instructions (Signed)
 It was wonderful to see you today!  Today we removed the stitch from your lower cyst. It has healed nicely and needs no further care at this time. The upper cyst has openned up, so I drained the pus and replaced the bandage, we will do another 10 days of keflex  to cover for infection, and go from there. Continue with daily bandage changes and gentle cleansing. We will follow up in one week.   Please call [phone] with any questions about today's appointment.   If you need any additional refills, please call your pharmacy before calling the office.  Lucie Pinal, DO Family Medicine

## 2024-07-26 ENCOUNTER — Ambulatory Visit: Payer: Self-pay | Admitting: Family Medicine

## 2024-08-29 ENCOUNTER — Ambulatory Visit: Admitting: Orthopaedic Surgery

## 2024-12-01 ENCOUNTER — Ambulatory Visit: Admitting: Internal Medicine
